# Patient Record
Sex: Female | Born: 1950
Health system: Southern US, Community
[De-identification: ages and names within clinical notes are randomized; demographics above are authoritative.]

## PROBLEM LIST (undated history)

## (undated) DIAGNOSIS — C801 Malignant (primary) neoplasm, unspecified: Secondary | ICD-10-CM

## (undated) DIAGNOSIS — IMO0002 Reserved for concepts with insufficient information to code with codable children: Secondary | ICD-10-CM

## (undated) DIAGNOSIS — G629 Polyneuropathy, unspecified: Secondary | ICD-10-CM

## (undated) DIAGNOSIS — R350 Frequency of micturition: Secondary | ICD-10-CM

## (undated) DIAGNOSIS — R51 Headache: Secondary | ICD-10-CM

## (undated) DIAGNOSIS — T4145XA Adverse effect of unspecified anesthetic, initial encounter: Secondary | ICD-10-CM

## (undated) DIAGNOSIS — R519 Headache, unspecified: Secondary | ICD-10-CM

## (undated) DIAGNOSIS — K219 Gastro-esophageal reflux disease without esophagitis: Secondary | ICD-10-CM

## (undated) DIAGNOSIS — M8580 Other specified disorders of bone density and structure, unspecified site: Secondary | ICD-10-CM

## (undated) DIAGNOSIS — E785 Hyperlipidemia, unspecified: Secondary | ICD-10-CM

## (undated) DIAGNOSIS — T8859XA Other complications of anesthesia, initial encounter: Secondary | ICD-10-CM

## (undated) DIAGNOSIS — N3941 Urge incontinence: Secondary | ICD-10-CM

## (undated) DIAGNOSIS — T7840XA Allergy, unspecified, initial encounter: Secondary | ICD-10-CM

## (undated) DIAGNOSIS — R112 Nausea with vomiting, unspecified: Secondary | ICD-10-CM

## (undated) DIAGNOSIS — M1990 Unspecified osteoarthritis, unspecified site: Secondary | ICD-10-CM

## (undated) DIAGNOSIS — IMO0001 Reserved for inherently not codable concepts without codable children: Secondary | ICD-10-CM

## (undated) DIAGNOSIS — Z9889 Other specified postprocedural states: Secondary | ICD-10-CM

## (undated) HISTORY — PX: CHOLECYSTECTOMY: SHX55

## (undated) HISTORY — DX: Unspecified osteoarthritis, unspecified site: M19.90

## (undated) HISTORY — PX: TUBAL LIGATION: SHX77

## (undated) HISTORY — DX: Allergy, unspecified, initial encounter: T78.40XA

## (undated) HISTORY — DX: Gastro-esophageal reflux disease without esophagitis: K21.9

## (undated) HISTORY — PX: COLONOSCOPY: SHX174

## (undated) HISTORY — DX: Polyneuropathy, unspecified: G62.9

---

## 1999-09-12 ENCOUNTER — Encounter: Payer: Self-pay | Admitting: Unknown Physician Specialty

## 1999-09-12 ENCOUNTER — Encounter: Admission: RE | Admit: 1999-09-12 | Discharge: 1999-09-12 | Payer: Self-pay | Admitting: Unknown Physician Specialty

## 2000-02-15 ENCOUNTER — Encounter (INDEPENDENT_AMBULATORY_CARE_PROVIDER_SITE_OTHER): Payer: Self-pay

## 2000-02-15 ENCOUNTER — Other Ambulatory Visit: Admission: RE | Admit: 2000-02-15 | Discharge: 2000-02-15 | Payer: Self-pay | Admitting: Obstetrics & Gynecology

## 2000-09-17 ENCOUNTER — Encounter: Payer: Self-pay | Admitting: Unknown Physician Specialty

## 2000-09-17 ENCOUNTER — Encounter: Admission: RE | Admit: 2000-09-17 | Discharge: 2000-09-17 | Payer: Self-pay | Admitting: Unknown Physician Specialty

## 2001-09-19 ENCOUNTER — Encounter: Admission: RE | Admit: 2001-09-19 | Discharge: 2001-09-19 | Payer: Self-pay | Admitting: Unknown Physician Specialty

## 2001-09-19 ENCOUNTER — Encounter: Payer: Self-pay | Admitting: Unknown Physician Specialty

## 2002-09-23 ENCOUNTER — Encounter: Payer: Self-pay | Admitting: Unknown Physician Specialty

## 2002-09-23 ENCOUNTER — Encounter: Admission: RE | Admit: 2002-09-23 | Discharge: 2002-09-23 | Payer: Self-pay | Admitting: Unknown Physician Specialty

## 2003-09-25 ENCOUNTER — Encounter: Admission: RE | Admit: 2003-09-25 | Discharge: 2003-09-25 | Payer: Self-pay | Admitting: Unknown Physician Specialty

## 2004-10-14 ENCOUNTER — Encounter: Admission: RE | Admit: 2004-10-14 | Discharge: 2004-10-14 | Payer: Self-pay | Admitting: Unknown Physician Specialty

## 2005-10-17 ENCOUNTER — Encounter: Admission: RE | Admit: 2005-10-17 | Discharge: 2005-10-17 | Payer: Self-pay | Admitting: Unknown Physician Specialty

## 2006-10-19 ENCOUNTER — Encounter: Admission: RE | Admit: 2006-10-19 | Discharge: 2006-10-19 | Payer: Self-pay | Admitting: Unknown Physician Specialty

## 2007-10-22 ENCOUNTER — Encounter: Admission: RE | Admit: 2007-10-22 | Discharge: 2007-10-22 | Payer: Self-pay | Admitting: Unknown Physician Specialty

## 2008-10-22 ENCOUNTER — Encounter: Admission: RE | Admit: 2008-10-22 | Discharge: 2008-10-22 | Payer: Self-pay | Admitting: Unknown Physician Specialty

## 2009-10-29 ENCOUNTER — Encounter: Admission: RE | Admit: 2009-10-29 | Discharge: 2009-10-29 | Payer: Self-pay | Admitting: Unknown Physician Specialty

## 2010-10-12 ENCOUNTER — Other Ambulatory Visit: Payer: Self-pay | Admitting: Internal Medicine

## 2010-10-12 DIAGNOSIS — Z1231 Encounter for screening mammogram for malignant neoplasm of breast: Secondary | ICD-10-CM

## 2010-11-04 ENCOUNTER — Ambulatory Visit
Admission: RE | Admit: 2010-11-04 | Discharge: 2010-11-04 | Disposition: A | Discharge status: Home / Self Care | Payer: BC Managed Care – PPO | Source: Ambulatory Visit | Attending: Internal Medicine | Admitting: Internal Medicine

## 2010-11-04 DIAGNOSIS — Z1231 Encounter for screening mammogram for malignant neoplasm of breast: Secondary | ICD-10-CM

## 2011-10-02 ENCOUNTER — Other Ambulatory Visit: Payer: Self-pay | Admitting: Family Medicine

## 2011-10-02 DIAGNOSIS — Z1231 Encounter for screening mammogram for malignant neoplasm of breast: Secondary | ICD-10-CM

## 2011-11-06 ENCOUNTER — Ambulatory Visit
Admission: RE | Admit: 2011-11-06 | Discharge: 2011-11-06 | Disposition: A | Discharge status: Home / Self Care | Payer: BC Managed Care – PPO | Source: Ambulatory Visit | Attending: Family Medicine | Admitting: Family Medicine

## 2011-11-06 DIAGNOSIS — Z1231 Encounter for screening mammogram for malignant neoplasm of breast: Secondary | ICD-10-CM

## 2012-10-02 ENCOUNTER — Other Ambulatory Visit: Payer: Self-pay

## 2012-10-02 DIAGNOSIS — Z1231 Encounter for screening mammogram for malignant neoplasm of breast: Secondary | ICD-10-CM

## 2012-11-08 ENCOUNTER — Ambulatory Visit
Admission: RE | Admit: 2012-11-08 | Discharge: 2012-11-08 | Disposition: A | Discharge status: Home / Self Care | Payer: BC Managed Care – PPO | Source: Ambulatory Visit

## 2012-11-08 DIAGNOSIS — Z1231 Encounter for screening mammogram for malignant neoplasm of breast: Secondary | ICD-10-CM

## 2013-10-08 ENCOUNTER — Other Ambulatory Visit: Payer: Self-pay

## 2013-10-08 DIAGNOSIS — Z1231 Encounter for screening mammogram for malignant neoplasm of breast: Secondary | ICD-10-CM

## 2013-11-10 ENCOUNTER — Ambulatory Visit
Admission: RE | Admit: 2013-11-10 | Discharge: 2013-11-10 | Disposition: A | Discharge status: Home / Self Care | Payer: BC Managed Care – PPO | Source: Ambulatory Visit

## 2013-11-10 DIAGNOSIS — Z1231 Encounter for screening mammogram for malignant neoplasm of breast: Secondary | ICD-10-CM

## 2013-11-12 ENCOUNTER — Other Ambulatory Visit: Payer: Self-pay | Admitting: Family Medicine

## 2013-11-12 DIAGNOSIS — R928 Other abnormal and inconclusive findings on diagnostic imaging of breast: Secondary | ICD-10-CM

## 2013-11-19 ENCOUNTER — Other Ambulatory Visit: Payer: Self-pay | Admitting: Family Medicine

## 2013-11-19 ENCOUNTER — Ambulatory Visit
Admission: RE | Admit: 2013-11-19 | Discharge: 2013-11-19 | Disposition: A | Discharge status: Home / Self Care | Payer: Medicare HMO | Source: Ambulatory Visit | Attending: Family Medicine | Admitting: Family Medicine

## 2013-11-19 ENCOUNTER — Ambulatory Visit
Admission: RE | Admit: 2013-11-19 | Discharge: 2013-11-19 | Disposition: A | Discharge status: Home / Self Care | Payer: BC Managed Care – PPO | Source: Ambulatory Visit | Attending: Family Medicine | Admitting: Family Medicine

## 2013-11-19 DIAGNOSIS — R928 Other abnormal and inconclusive findings on diagnostic imaging of breast: Secondary | ICD-10-CM

## 2014-04-10 ENCOUNTER — Other Ambulatory Visit: Payer: Self-pay | Admitting: Family Medicine

## 2014-04-10 DIAGNOSIS — R921 Mammographic calcification found on diagnostic imaging of breast: Secondary | ICD-10-CM

## 2014-05-20 ENCOUNTER — Ambulatory Visit
Admission: RE | Admit: 2014-05-20 | Discharge: 2014-05-20 | Disposition: A | Discharge status: Home / Self Care | Payer: Medicare HMO | Source: Ambulatory Visit | Attending: Family Medicine | Admitting: Family Medicine

## 2014-05-20 ENCOUNTER — Other Ambulatory Visit: Payer: Self-pay | Admitting: Family Medicine

## 2014-05-20 DIAGNOSIS — R921 Mammographic calcification found on diagnostic imaging of breast: Secondary | ICD-10-CM

## 2014-10-20 ENCOUNTER — Other Ambulatory Visit: Payer: Self-pay | Admitting: Family Medicine

## 2014-10-20 DIAGNOSIS — R921 Mammographic calcification found on diagnostic imaging of breast: Secondary | ICD-10-CM

## 2014-11-13 ENCOUNTER — Ambulatory Visit
Admission: RE | Admit: 2014-11-13 | Discharge: 2014-11-13 | Disposition: A | Discharge status: Home / Self Care | Payer: Medicare HMO | Source: Ambulatory Visit | Attending: Family Medicine | Admitting: Family Medicine

## 2014-11-13 ENCOUNTER — Other Ambulatory Visit: Payer: Self-pay | Admitting: Family Medicine

## 2014-11-13 DIAGNOSIS — R921 Mammographic calcification found on diagnostic imaging of breast: Secondary | ICD-10-CM

## 2015-02-11 ENCOUNTER — Ambulatory Visit: Payer: Medicare HMO | Attending: Gynecologic Oncology | Admitting: Gynecologic Oncology

## 2015-02-11 ENCOUNTER — Encounter: Payer: Self-pay | Admitting: Gynecologic Oncology

## 2015-02-11 VITALS — BP 150/68 | HR 82 | Temp 98.3°F | Resp 18 | Ht 67.0 in | Wt 178.1 lb

## 2015-02-11 DIAGNOSIS — C541 Malignant neoplasm of endometrium: Secondary | ICD-10-CM | POA: Diagnosis not present

## 2015-02-11 NOTE — Patient Instructions (Signed)
Preparing for your Surgery  Plan for surgery on January 12 with Dr. [name].  You will be scheduled for a robotic assisted total hysterectomy, bilateral salpingo-oophorectomy, lymph node biopsy.  Pre-operative Testing -You will receive a phone call from presurgical testing at [facility] to arrange for a pre-operative testing appointment before your surgery.  This appointment normally occurs one to two weeks before your scheduled surgery.   -Bring your insurance card, copy of an advanced directive if applicable, medication list  -At that visit, you will be asked to sign a consent for a possible blood transfusion in case a transfusion becomes necessary during surgery.  The need for a blood transfusion is rare but having consent is a necessary part of your care.     -You should not be taking blood thinners or aspirin at least ten days prior to surgery unless instructed by your surgeon.  Day Before Surgery at [place] will be asked to take in only clear liquids the day before surgery.  Examples of clear liquids include broths, jello, and clear juices.  Avoid carbonated beverages.  You will be advised to have nothing to eat or drink after midnight the evening before.    Your role in recovery Your role is to become active as soon as directed by your doctor, while still giving yourself time to heal.  Rest when you feel tired. You will be asked to do the following in order to speed your recovery:  - Cough and breathe deeply. This helps toclear and expand your lungs and can prevent pneumonia. You may be given a spirometer to practice deep breathing. A staff member will show you how to use the spirometer. - Do mild physical activity. Walking or moving your legs help your circulation and body functions return to normal. A staff member will help you when you try to walk and will provide you with simple exercises. Do not try to get up or walk alone the first time. - Actively  manage your pain. Managing your pain lets you move in comfort. We will ask you to rate your pain on a scale of zero to 10. It is your responsibility to tell your doctor or nurse where and how much you hurt so your pain can be treated.  Special Considerations -If you are diabetic, you may be placed on insulin after surgery to have closer control over your blood sugars to promote healing and recovery.  This does not mean that you will be discharged on insulin.  If applicable, your oral antidiabetics will be resumed when you are tolerating a solid diet.  -Your final pathology results from surgery should be available by the Friday after surgery and the results will be relayed to you when available.  Blood Transfusion Information WHAT IS A BLOOD TRANSFUSION? A transfusion is the replacement of blood or some of its parts. Blood is made up of multiple cells which provide different functions.  Red blood cells carry oxygen and are used for blood loss replacement.  White blood cells fight against infection.  Platelets control bleeding.  Plasma helps clot blood.  Other blood products are available for specialized needs, such as hemophilia or other clotting disorders. BEFORE THE TRANSFUSION  Who gives blood for transfusions?   You may be able to donate blood to be used at a later date on yourself (autologous donation).  Relatives can be asked to donate blood. This is generally not any safer than if you have received blood from a  stranger. The same precautions are taken to ensure safety when a relative's blood is donated.  Healthy volunteers who are fully evaluated to make sure their blood is safe. This is blood bank blood. Transfusion therapy is the safest it has ever been in the practice of medicine. Before blood is taken from a donor, a complete history is taken to make sure that person has no history of diseases nor engages in risky social behavior (examples are intravenous drug use or sexual  activity with multiple partners). The donor's travel history is screened to minimize risk of transmitting infections, such as malaria. The donated blood is tested for signs of infectious diseases, such as HIV and hepatitis. The blood is then tested to be sure it is compatible with you in order to minimize the chance of a transfusion reaction. If you or a relative donates blood, this is often done in anticipation of surgery and is not appropriate for emergency situations. It takes many days to process the donated blood. RISKS AND COMPLICATIONS Although transfusion therapy is very safe and saves many lives, the main dangers of transfusion include:   Getting an infectious disease.  Developing a transfusion reaction. This is an allergic reaction to something in the blood you were given. Every precaution is taken to prevent this. The decision to have a blood transfusion has been considered carefully by your caregiver before blood is given. Blood is not given unless the benefits outweigh the risks.

## 2015-02-11 NOTE — Progress Notes (Signed)
Consult Note: Gyn-Onc  Consult was requested by Dr. Carlena Bjornstad for the evaluation of [name] 64 y.o. female with grade 2 endometrial cancer.  CC:  Chief Complaint  Patient presents with  . endometrial cancer    New Consult    Assessment/Plan:  [name]  is a 63 y.o.  year old with grade 2 endometrial cancer.   A detailed discussion was held with the patient and her family with regard to to her endometrial cancer diagnosis. We discussed the standard management options for uterine cancer which includes surgery followed possibly by adjuvant therapy depending on the results of surgery. The options for surgical management include a hysterectomy and removal of the tubes and ovaries possibly with removal of pelvic and para-aortic lymph nodes. A minimally invasive approach including a robotic hysterectomy or laparoscopic hysterectomy have benefits including shorter hospital stay, recovery time and better wound healing. The alternative approach is an open hysterectomy. The patient has been counseled about these surgical options and the risks of surgery in general including infection, bleeding, damage to surrounding structures (including bowel, bladder, ureters, nerves or vessels), and the postoperative risks of PE/ DVT, and lymphedema. I extensively reviewed the additional risks of robotic hysterectomy including possible need for conversion to open laparotomy.  I discussed positioning during surgery of trendelenberg and risks of minor facial swelling and care we take in preoperative positioning.  After counseling and consideration of her options, she desires to proceed with robotic hysterectomy, BSO, sentinel lymph node mapping.   The patient desires that her surgery be expedited and therefore we will schedule it with my partner Dr [name]. She has a significant cystocele and apical prolapse. We discussed that this could be addressed with a combined procedure with a  urogynecologist, however, this would likely delay her surgery to 4-6 weeks in order to coordinate consultations and a coordinated surgical effort. The patient declined this. Once we have determined if she requires adjuvant therapy postoperatively we will refer her to an appropriate provider for her prolapse symptoms.  She will be seen by anesthesia for preoperative clearance and discussion of postoperative pain management.  She was given the opportunity to ask questions, which were answered to her satisfaction, and she is agreement with the above mentioned plan of care.   HPI: Erica Moyer is a 64 year old woman (G1P1) who is seen in consultation at the request of Dr Carlena Bjornstad for grade 2 endometrial cancer.  The patient reports having an episode of vaginal spotting in May 2016. She initially thought this was due to urethral irritation as she has bladder prolapse. She then continued to spot through the summer and fall of 2016 and was evaluated by Dr. Marvel Plan on 01/19/2015 at which time a transvaginal ultrasound scan was performed which revealed a normal size uterus measuring 7.4 x 8.4 x 3.7 cm with a thickened endometrial stripe 15 mm. The ovaries were grossly normal. She then underwent a endometrial biopsy on 01/28/2015 which revealed FIGO grade 2 moderately differentiated endometrioid adenocarcinoma.  The patient is otherwise very healthy. She has some hypertension. She's had one prior vaginal delivery and has significant symptomatic cystocele and vaginal prolapse. She denies urinary incontinence. She has had only a tubal ligation and a laparoscopic cholecystectomy as abdominal surgeries. She is overweight but not obese.   Current Meds:  Outpatient Encounter Prescriptions as of 02/11/2015  Medication Sig  . calcium carbonate (TUMS - DOSED IN MG ELEMENTAL CALCIUM) 500 MG chewable tablet Chew  1 tablet by mouth 3 (three) times daily as needed for indigestion or heartburn.  . Calcium  Carbonate-Vitamin D (CALTRATE 600+D PO) Take 600 mg by mouth every morning.  . Cetirizine HCl (ZYRTEC ALLERGY) 10 MG CAPS Take 10 mg by mouth every morning.  Marland Kitchen ibuprofen (ADVIL,MOTRIN) 200 MG tablet Take 200 mg by mouth every 6 (six) hours as needed.  . mometasone (NASONEX) 50 MCG/ACT nasal spray INHALE 2 SPRAYS IN EACH NOSTRIL DAILY AS NEEDED  . Multiple Vitamin (MULTIVITAMIN) tablet Take 1 tablet by mouth daily.  . rosuvastatin (CRESTOR) 10 MG tablet Take 10 mg by mouth daily.  Marland Kitchen alendronate (FOSAMAX) 35 MG tablet Take 35 mg by mouth once a week. Reported on 02/11/2015   No facility-administered encounter medications on file as of 02/11/2015.    Allergy: No Known Allergies  Social Hx:   Social History   Social History  . Marital Status: Married    Spouse Name: N/A  . Number of Children: N/A  . Years of Education: N/A   Occupational History  . Not on file.   Social History Main Topics  . Smoking status: Never Smoker   . Smokeless tobacco: Not on file  . Alcohol Use: No  . Drug Use: No  . Sexual Activity: Not on file   Other Topics Concern  . Not on file   Social History Narrative  . No narrative on file    Past Surgical Hx:  Past Surgical History  Procedure Laterality Date  . Gallbladder surgery  2007  . Tubal ligation      1986    Past Medical Hx:  Past Medical History  Diagnosis Date  . Allergy   . GERD (gastroesophageal reflux disease)     Past Gynecological History:  None. SVD x 1 No LMP recorded.  Family Hx:  Family History  Problem Relation Age of Onset  . Anesthesia problems Mother   . Hypertension Mother   . Hypertension Father   . Stroke Father   . Cancer Paternal Grandmother     Review of Systems:  Constitutional  Feels well,    ENT Normal appearing ears and nares bilaterally Skin/Breast  No rash, sores, jaundice, itching, dryness Cardiovascular  No chest pain, shortness of breath, or edema  Pulmonary  No cough or wheeze.   Gastro Intestinal  No nausea, vomitting, or diarrhoea. No bright red blood per rectum, no abdominal pain, change in bowel movement, or constipation.  Genito Urinary  No frequency, urgency, dysuria, see HPI Musculo Skeletal  No myalgia, arthralgia, joint swelling or pain  Neurologic  No weakness, numbness, change in gait,  Psychology  No depression, anxiety, insomnia.   Vitals:  Blood pressure 150/68, pulse 82, temperature 98.3 F (36.8 C), temperature source Oral, resp. rate 18, height 5\' 7"  (1.702 m), weight 178 lb 1.6 oz (80.786 kg), SpO2 100 %.  Physical Exam: WD in NAD Neck  Supple NROM, without any enlargements.  Lymph Node Survey No cervical supraclavicular or inguinal adenopathy Cardiovascular  Pulse normal rate, regularity and rhythm. S1 and S2 normal.  Lungs  Clear to auscultation bilateraly, without wheezes/crackles/rhonchi. Good air movement.  Skin  No rash/lesions/breakdown  Psychiatry  Alert and oriented to person, place, and time  Abdomen  Normoactive bowel sounds, abdomen soft, non-tender and overweight without evidence of hernia.  Back No CVA tenderness Genito Urinary  Vulva/vagina: Normal external female genitalia.  No lesions. No discharge or bleeding.  Bladder/urethra: prolapse of mid anterior vagina to introitus  Vagina: normal  Cervix: Normal appearing, no lesions.  Uterus:  Small, mobile, no parametrial involvement or nodularity.  Adnexa: no palpable masses. Rectal  Good tone, no masses no cul de sac nodularity.  Extremities  No bilateral cyanosis, clubbing or edema.   Donaciano Eva, MD  02/11/2015, 4:05 PM

## 2015-02-19 NOTE — Patient Instructions (Addendum)
YOUR PROCEDURE IS SCHEDULED ON :  02/25/15  REPORT TO [place] MAIN ENTRANCE FOLLOW SIGNS TO EAST ELEVATOR - GO TO 3rd FLOOR CHECK IN AT 3 EAST NURSES STATION (SHORT STAY) AT:  5:30 AM  CALL THIS NUMBER IF YOU HAVE PROBLEMS THE MORNING OF SURGERY [phone]  REMEMBER:ONLY 1 PER PERSON MAY GO TO SHORT STAY WITH YOU TO GET READY THE MORNING OF YOUR SURGERY  DO NOT EAT FOOD OR DRINK LIQUIDS AFTER MIDNIGHT  TAKE THESE MEDICINES THE MORNING OF SURGERY: CETRIZINE / NASONEX  [name]  CLEAR LIQUID DIET  Foods Allowed                                                                     Foods Excluded  Coffee and tea, regular and decaf                             liquids that you cannot  Plain Jell-O in any flavor                                             see through such as: Fruit ices (not with fruit pulp)                                     milk, soups, orange juice  Iced Popsicles                                                       All solid food Cranberry, grape and apple juices Sports drinks like Gatorade Lightly seasoned clear broth or consume(fat free) Sugar, honey syrup ___________________________NO CARBONATED BEVERAGES__________________________________________    YOU MAY NOT HAVE ANY METAL ON YOUR BODY INCLUDING HAIR PINS AND PIERCING'S. DO NOT WEAR JEWELRY, MAKEUP, LOTIONS, POWDERS OR PERFUMES. DO NOT WEAR NAIL POLISH. DO NOT SHAVE 48 HRS PRIOR TO SURGERY. MEN MAY SHAVE FACE AND NECK.  DO NOT [place]. [place] IS NOT RESPONSIBLE FOR VALUABLES.  CONTACTS, DENTURES OR PARTIALS MAY NOT BE WORN TO SURGERY. LEAVE SUITCASE IN CAR. CAN BE BROUGHT TO ROOM AFTER SURGERY.  PATIENTS DISCHARGED THE DAY OF SURGERY WILL NOT BE ALLOWED TO DRIVE HOME.  PLEASE READ OVER THE FOLLOWING INSTRUCTION SHEETS _________________________________________________________________________________                     [place] - PREPARING FOR SURGERY  Before surgery, you can play an important role.  Because skin is not sterile, your skin needs to be as free of germs as possible.  You can reduce the number of germs on your skin by washing with CHG (chlorahexidine gluconate) soap before surgery.  CHG is an antiseptic cleaner which kills germs and bonds with the skin to continue killing germs even after washing. Please  DO NOT use if you have an allergy to CHG or antibacterial soaps.  If your skin becomes reddened/irritated stop using the CHG and inform your nurse when you arrive at Short Stay. Do not shave (including legs and underarms) for at least 48 hours prior to the first CHG shower.  You may shave your face. Please follow these instructions carefully:   1.  Shower with CHG Soap the night before surgery and the  morning of Surgery.   2.  If you choose to wash your hair, wash your hair first as usual with your  normal  Shampoo.   3.  After you shampoo, rinse your hair and body thoroughly to remove the  shampoo.                                         4.  Use CHG as you would any other liquid soap.  You can apply chg directly  to the skin and wash . Gently wash with scrungie or clean wascloth    5.  Apply the CHG Soap to your body ONLY FROM THE NECK DOWN.   Do not use on open                           Wound or open sores. Avoid contact with eyes, ears mouth and genitals (private parts).                        Genitals (private parts) with your normal soap.              6.  Wash thoroughly, paying special attention to the area where your surgery  will be performed.   7.  Thoroughly rinse your body with warm water from the neck down.   8.  DO NOT shower/wash with your normal soap after using and rinsing off  the CHG Soap .                9.  Pat yourself dry with a clean towel.             10.  Wear clean night clothes to bed after shower             11.  Place clean sheets on your  bed the night of your first shower and do not  sleep with pets.  Day of Surgery : Do not apply any lotions/deodorants the morning of surgery.  Please wear clean clothes to the hospital/surgery center.  FAILURE TO FOLLOW THESE INSTRUCTIONS MAY RESULT IN THE CANCELLATION OF YOUR SURGERY    PATIENT SIGNATURE_________________________________  ______________________________________________________________________     Erica Moyer  An incentive spirometer is a tool that can help keep your lungs clear and active. This tool measures how well you are filling your lungs with each breath. Taking long deep breaths may help reverse or decrease the chance of developing breathing (pulmonary) problems (especially infection) following:  A long period of time when you are unable to move or be active. BEFORE THE PROCEDURE   If the spirometer includes an indicator to show your best effort, your nurse or respiratory therapist will set it to a desired goal.  If possible, sit up straight or lean slightly forward. Try not to slouch.  Hold the incentive spirometer in an upright position. INSTRUCTIONS FOR USE  Sit on the edge of your bed if possible, or sit up as far as you can in bed or on a chair.  Hold the incentive spirometer in an upright position.  Breathe out normally.  Place the mouthpiece in your mouth and seal your lips tightly around it.  Breathe in slowly and as deeply as possible, raising the piston or the ball toward the top of the column.  Hold your breath for 3-5 seconds or for as long as possible. Allow the piston or ball to fall to the bottom of the column.  Remove the mouthpiece from your mouth and breathe out normally.  Rest for a few seconds and repeat Steps 1 through 7 at least 10 times every 1-2 hours when you are awake. Take your time and take a few normal breaths between deep breaths.  The spirometer may include an indicator to show your best effort. Use the  indicator as a goal to work toward during each repetition.  After each set of 10 deep breaths, practice coughing to be sure your lungs are clear. If you have an incision (the cut made at the time of surgery), support your incision when coughing by placing a pillow or rolled up towels firmly against it. Once you are able to get out of bed, walk around indoors and cough well. You may stop using the incentive spirometer when instructed by your caregiver.  RISKS AND COMPLICATIONS  Take your time so you do not get dizzy or light-headed.  If you are in pain, you may need to take or ask for pain medication before doing incentive spirometry. It is harder to take a deep breath if you are having pain. AFTER USE  Rest and breathe slowly and easily.  It can be helpful to keep track of a log of your progress. Your caregiver can provide you with a simple table to help with this. If you are using the spirometer at home, follow these instructions: [place] IF:   You are having difficultly using the spirometer.  You have trouble using the spirometer as often as instructed.  Your pain medication is not giving enough relief while using the spirometer.  You develop fever of 100.5 F (38.1 C) or higher. SEEK IMMEDIATE MEDICAL CARE IF:   You cough up bloody sputum that had not been present before.  You develop fever of 102 F (38.9 C) or greater.  You develop worsening pain at or near the incision site. MAKE SURE YOU:   Understand these instructions.  Will watch your condition.  Will get help right away if you are not doing well or get worse. Document Released: 06/12/2006 Document Revised: 04/24/2011 Document Reviewed: 08/13/2006 Exit[employer] Patient Information 2014 [employer], [place].   _____________________________________________________________________                WHAT IS A BLOOD TRANSFUSION? Blood Transfusion Information  A transfusion is the replacement of blood or  some of its parts. Blood is made up of multiple cells which provide different functions.  Red blood cells carry oxygen and are used for blood loss replacement.  White blood cells fight against infection.  Platelets control bleeding.  Plasma helps clot blood.  Other blood products are available for specialized needs, such as hemophilia or other clotting disorders. BEFORE THE TRANSFUSION  Who gives blood for transfusions?   Healthy volunteers who are fully evaluated to make sure their blood is safe. This is blood bank blood. Transfusion therapy is the safest it has  ever been in the practice of medicine. Before blood is taken from a donor, a complete history is taken to make sure that person has no history of diseases nor engages in risky social behavior (examples are intravenous drug use or sexual activity with multiple partners). The donor's travel history is screened to minimize risk of transmitting infections, such as malaria. The donated blood is tested for signs of infectious diseases, such as HIV and hepatitis. The blood is then tested to be sure it is compatible with you in order to minimize the chance of a transfusion reaction. If you or a relative donates blood, this is often done in anticipation of surgery and is not appropriate for emergency situations. It takes many days to process the donated blood. RISKS AND COMPLICATIONS Although transfusion therapy is very safe and saves many lives, the main dangers of transfusion include:   Getting an infectious disease.  Developing a transfusion reaction. This is an allergic reaction to something in the blood you were given. Every precaution is taken to prevent this. The decision to have a blood transfusion has been considered carefully by your caregiver before blood is given. Blood is not given unless the benefits outweigh the risks. AFTER THE TRANSFUSION  Right after receiving a blood transfusion, you will usually feel much better and more  energetic. This is especially true if your red blood cells have gotten low (anemic). The transfusion raises the level of the red blood cells which carry oxygen, and this usually causes an energy increase.  The nurse administering the transfusion will monitor you carefully for complications. HOME CARE INSTRUCTIONS  No special instructions are needed after a transfusion. You may find your energy is better. Speak with your caregiver about any limitations on activity for underlying diseases you may have. SEEK MEDICAL CARE IF:   Your condition is not improving after your transfusion.  You develop redness or irritation at the intravenous (IV) site. SEEK IMMEDIATE MEDICAL CARE IF:  Any of the following symptoms occur over the next 12 hours:  Shaking chills.  You have a temperature by mouth above 102 F (38.9 C), not controlled by medicine.  Chest, back, or muscle pain.  People around you feel you are not acting correctly or are confused.  Shortness of breath or difficulty breathing.  Dizziness and fainting.  You get a rash or develop hives.  You have a decrease in urine output.  Your urine turns a dark color or changes to pink, red, or brown. Any of the following symptoms occur over the next 10 days:  You have a temperature by mouth above 102 F (38.9 C), not controlled by medicine.  Shortness of breath.  Weakness after normal activity.  The white part of the eye turns yellow (jaundice).  You have a decrease in the amount of urine or are urinating less often.  Your urine turns a dark color or changes to pink, red, or brown. Document Released: 01/28/2000 Document Revised: 04/24/2011 Document Reviewed: 09/16/2007 [facility] Patient Information 2014 [place], [place].  _______________________________________________________________________

## 2015-02-23 ENCOUNTER — Ambulatory Visit (HOSPITAL_COMMUNITY)
Admission: RE | Admit: 2015-02-23 | Discharge: 2015-02-23 | Disposition: A | Discharge status: Home / Self Care | Payer: PPO | Source: Ambulatory Visit | Attending: Gynecologic Oncology | Admitting: Gynecologic Oncology

## 2015-02-23 ENCOUNTER — Encounter (HOSPITAL_COMMUNITY): Payer: Self-pay

## 2015-02-23 ENCOUNTER — Encounter (HOSPITAL_COMMUNITY)
Admission: RE | Admit: 2015-02-23 | Discharge: 2015-02-23 | Disposition: A | Discharge status: Home / Self Care | Payer: PPO | Source: Ambulatory Visit | Attending: Gynecologic Oncology | Admitting: Gynecologic Oncology

## 2015-02-23 DIAGNOSIS — Z01812 Encounter for preprocedural laboratory examination: Secondary | ICD-10-CM | POA: Insufficient documentation

## 2015-02-23 DIAGNOSIS — Z01818 Encounter for other preprocedural examination: Secondary | ICD-10-CM | POA: Insufficient documentation

## 2015-02-23 DIAGNOSIS — Z0183 Encounter for blood typing: Secondary | ICD-10-CM | POA: Insufficient documentation

## 2015-02-23 DIAGNOSIS — C541 Malignant neoplasm of endometrium: Secondary | ICD-10-CM | POA: Insufficient documentation

## 2015-02-23 HISTORY — DX: Other specified postprocedural states: Z98.890

## 2015-02-23 HISTORY — DX: Headache: R51

## 2015-02-23 HISTORY — DX: Other specified disorders of bone density and structure, unspecified site: M85.80

## 2015-02-23 HISTORY — DX: Malignant (primary) neoplasm, unspecified: C80.1

## 2015-02-23 HISTORY — DX: Headache, unspecified: R51.9

## 2015-02-23 HISTORY — DX: Nausea with vomiting, unspecified: R11.2

## 2015-02-23 HISTORY — DX: Adverse effect of unspecified anesthetic, initial encounter: T41.45XA

## 2015-02-23 HISTORY — DX: Other complications of anesthesia, initial encounter: T88.59XA

## 2015-02-23 HISTORY — DX: Frequency of micturition: R35.0

## 2015-02-23 HISTORY — DX: Urge incontinence: N39.41

## 2015-02-23 HISTORY — DX: Hyperlipidemia, unspecified: E78.5

## 2015-02-23 LAB — CBC WITH DIFFERENTIAL/PLATELET
BASOS ABS: 0 10*3/uL (ref 0.0–0.1)
Basophils Relative: 0 %
Eosinophils Absolute: 0.2 10*3/uL (ref 0.0–0.7)
Eosinophils Relative: 4 %
HEMATOCRIT: 42 % (ref 36.0–46.0)
HEMOGLOBIN: 13.7 g/dL (ref 12.0–15.0)
LYMPHS ABS: 1.8 10*3/uL (ref 0.7–4.0)
LYMPHS PCT: 33 %
MCH: 28.7 pg (ref 26.0–34.0)
MCHC: 32.6 g/dL (ref 30.0–36.0)
MCV: 87.9 fL (ref 78.0–100.0)
Monocytes Absolute: 0.5 10*3/uL (ref 0.1–1.0)
Monocytes Relative: 10 %
NEUTROS ABS: 2.9 10*3/uL (ref 1.7–7.7)
Neutrophils Relative %: 53 %
PLATELETS: 194 10*3/uL (ref 150–400)
RBC: 4.78 MIL/uL (ref 3.87–5.11)
RDW: 13 % (ref 11.5–15.5)
WBC: 5.4 10*3/uL (ref 4.0–10.5)

## 2015-02-23 LAB — COMPREHENSIVE METABOLIC PANEL
ALK PHOS: 54 U/L (ref 38–126)
ALT: 23 U/L (ref 14–54)
AST: 22 U/L (ref 15–41)
Albumin: 4.4 g/dL (ref 3.5–5.0)
Anion gap: 7 (ref 5–15)
BILIRUBIN TOTAL: 0.9 mg/dL (ref 0.3–1.2)
BUN: 13 mg/dL (ref 6–20)
CALCIUM: 9.8 mg/dL (ref 8.9–10.3)
CHLORIDE: 110 mmol/L (ref 101–111)
CO2: 27 mmol/L (ref 22–32)
CREATININE: 0.69 mg/dL (ref 0.44–1.00)
Glucose, Bld: 103 mg/dL — ABNORMAL HIGH (ref 65–99)
Potassium: 4.6 mmol/L (ref 3.5–5.1)
Sodium: 144 mmol/L (ref 135–145)
Total Protein: 7.6 g/dL (ref 6.5–8.1)

## 2015-02-23 LAB — URINALYSIS, ROUTINE W REFLEX MICROSCOPIC
Bilirubin Urine: NEGATIVE
Glucose, UA: NEGATIVE mg/dL
HGB URINE DIPSTICK: NEGATIVE
Ketones, ur: NEGATIVE mg/dL
Nitrite: NEGATIVE
PH: 7.5 (ref 5.0–8.0)
Protein, ur: NEGATIVE mg/dL
SPECIFIC GRAVITY, URINE: 1.022 (ref 1.005–1.030)

## 2015-02-23 LAB — URINE MICROSCOPIC-ADD ON

## 2015-02-23 LAB — ABO/RH: ABO/RH(D): AB NEG

## 2015-02-25 ENCOUNTER — Ambulatory Visit (HOSPITAL_COMMUNITY): Payer: PPO | Admitting: Certified Registered Nurse Anesthetist

## 2015-02-25 ENCOUNTER — Encounter (HOSPITAL_COMMUNITY): Payer: Self-pay | Admitting: *Deleted

## 2015-02-25 ENCOUNTER — Encounter (HOSPITAL_COMMUNITY)
Admission: RE | Disposition: A | Discharge status: Home / Self Care | Payer: Self-pay | Source: Ambulatory Visit | Attending: Obstetrics & Gynecology

## 2015-02-25 ENCOUNTER — Ambulatory Visit (HOSPITAL_COMMUNITY)
Admission: RE | Admit: 2015-02-25 | Discharge: 2015-02-26 | Disposition: A | Discharge status: Home / Self Care | Payer: PPO | Source: Ambulatory Visit | Attending: Obstetrics & Gynecology | Admitting: Obstetrics & Gynecology

## 2015-02-25 DIAGNOSIS — N814 Uterovaginal prolapse, unspecified: Secondary | ICD-10-CM | POA: Insufficient documentation

## 2015-02-25 DIAGNOSIS — E663 Overweight: Secondary | ICD-10-CM | POA: Insufficient documentation

## 2015-02-25 DIAGNOSIS — C541 Malignant neoplasm of endometrium: Secondary | ICD-10-CM | POA: Diagnosis present

## 2015-02-25 DIAGNOSIS — Z79899 Other long term (current) drug therapy: Secondary | ICD-10-CM | POA: Insufficient documentation

## 2015-02-25 DIAGNOSIS — K219 Gastro-esophageal reflux disease without esophagitis: Secondary | ICD-10-CM | POA: Diagnosis not present

## 2015-02-25 DIAGNOSIS — Z6828 Body mass index (BMI) 28.0-28.9, adult: Secondary | ICD-10-CM | POA: Insufficient documentation

## 2015-02-25 DIAGNOSIS — I10 Essential (primary) hypertension: Secondary | ICD-10-CM | POA: Insufficient documentation

## 2015-02-25 HISTORY — PX: ROBOTIC ASSISTED TOTAL HYSTERECTOMY WITH BILATERAL SALPINGO OOPHERECTOMY: SHX6086

## 2015-02-25 LAB — TYPE AND SCREEN
ABO/RH(D): AB NEG
Antibody Screen: NEGATIVE

## 2015-02-25 SURGERY — HYSTERECTOMY, TOTAL, ROBOT-ASSISTED, LAPAROSCOPIC, WITH BILATERAL SALPINGO-OOPHORECTOMY
Anesthesia: General | Laterality: Bilateral

## 2015-02-25 MED ORDER — ROCURONIUM BROMIDE 100 MG/10ML IV SOLN
INTRAVENOUS | Status: DC | PRN
  Administered 2015-02-25: 50 mg via INTRAVENOUS
  Administered 2015-02-25: 20 mg via INTRAVENOUS
  Administered 2015-02-25: 10 mg via INTRAVENOUS

## 2015-02-25 MED ORDER — ONDANSETRON HCL 4 MG/2ML IJ SOLN: 4.0000 mg | Freq: Four times a day (QID) | INTRAMUSCULAR | Status: DC | PRN

## 2015-02-25 MED ORDER — LIDOCAINE HCL (CARDIAC) 20 MG/ML IV SOLN
INTRAVENOUS | Status: DC | PRN
  Administered 2015-02-25: 50 mg via INTRAVENOUS

## 2015-02-25 MED ORDER — KCL IN DEXTROSE-NACL 20-5-0.45 MEQ/L-%-% IV SOLN
INTRAVENOUS | Status: DC
  Administered 2015-02-25 – 2015-02-26 (×2): via INTRAVENOUS
  Filled 2015-02-25 (×3): qty 1000

## 2015-02-25 MED ORDER — HEPARIN SODIUM (PORCINE) 5000 UNIT/ML IJ SOLN
5000.0000 [IU] | INTRAMUSCULAR | Status: AC
  Administered 2015-02-25: 5000 [IU] via SUBCUTANEOUS
  Filled 2015-02-25: qty 1

## 2015-02-25 MED ORDER — SUGAMMADEX SODIUM 500 MG/5ML IV SOLN
INTRAVENOUS | Status: AC
  Filled 2015-02-25: qty 5

## 2015-02-25 MED ORDER — ONDANSETRON HCL 4 MG/2ML IJ SOLN
INTRAMUSCULAR | Status: AC
  Filled 2015-02-25: qty 2

## 2015-02-25 MED ORDER — ENOXAPARIN SODIUM 40 MG/0.4ML ~~LOC~~ SOLN
40.0000 mg | SUBCUTANEOUS | Status: DC
  Administered 2015-02-26: 40 mg via SUBCUTANEOUS
  Filled 2015-02-25 (×2): qty 0.4

## 2015-02-25 MED ORDER — EPHEDRINE SULFATE 50 MG/ML IJ SOLN
INTRAMUSCULAR | Status: DC | PRN
  Administered 2015-02-25: 15 mg via INTRAVENOUS
  Administered 2015-02-25 (×2): 10 mg via INTRAVENOUS

## 2015-02-25 MED ORDER — CEFAZOLIN SODIUM-DEXTROSE 2-3 GM-% IV SOLR
INTRAVENOUS | Status: AC
  Filled 2015-02-25: qty 50

## 2015-02-25 MED ORDER — PROPOFOL 10 MG/ML IV BOLUS
INTRAVENOUS | Status: AC
  Filled 2015-02-25: qty 40

## 2015-02-25 MED ORDER — CEFAZOLIN SODIUM-DEXTROSE 2-3 GM-% IV SOLR
2.0000 g | INTRAVENOUS | Status: AC
  Administered 2015-02-25: 2 g via INTRAVENOUS

## 2015-02-25 MED ORDER — SUGAMMADEX SODIUM 500 MG/5ML IV SOLN
INTRAVENOUS | Status: DC | PRN
  Administered 2015-02-25: 350 mg via INTRAVENOUS

## 2015-02-25 MED ORDER — FENTANYL CITRATE (PF) 250 MCG/5ML IJ SOLN
INTRAMUSCULAR | Status: AC
  Filled 2015-02-25: qty 5

## 2015-02-25 MED ORDER — OXYCODONE-ACETAMINOPHEN 5-325 MG PO TABS: 1.0000 | ORAL_TABLET | ORAL | Status: DC | PRN

## 2015-02-25 MED ORDER — LIDOCAINE HCL (CARDIAC) 20 MG/ML IV SOLN
INTRAVENOUS | Status: AC
  Filled 2015-02-25: qty 5

## 2015-02-25 MED ORDER — ONDANSETRON HCL 4 MG/2ML IJ SOLN
4.0000 mg | Freq: Four times a day (QID) | INTRAMUSCULAR | Status: DC | PRN
  Administered 2015-02-25: 4 mg via INTRAVENOUS
  Filled 2015-02-25: qty 2

## 2015-02-25 MED ORDER — ONDANSETRON HCL 4 MG PO TABS: 4.0000 mg | ORAL_TABLET | Freq: Four times a day (QID) | ORAL | Status: DC | PRN

## 2015-02-25 MED ORDER — RINGERS IRRIGATION IR SOLN
Status: DC | PRN
  Administered 2015-02-25: 1

## 2015-02-25 MED ORDER — ROSUVASTATIN CALCIUM 10 MG PO TABS
10.0000 mg | ORAL_TABLET | Freq: Every day | ORAL | Status: DC
  Administered 2015-02-25: 10 mg via ORAL
  Filled 2015-02-25 (×2): qty 1

## 2015-02-25 MED ORDER — OXYCODONE HCL 5 MG/5ML PO SOLN
5.0000 mg | Freq: Once | ORAL | Status: DC | PRN
  Filled 2015-02-25: qty 5

## 2015-02-25 MED ORDER — HYDROMORPHONE HCL 1 MG/ML IJ SOLN
0.2500 mg | INTRAMUSCULAR | Status: DC | PRN
  Administered 2015-02-25 (×2): 0.5 mg via INTRAVENOUS

## 2015-02-25 MED ORDER — ONDANSETRON HCL 4 MG/2ML IJ SOLN
INTRAMUSCULAR | Status: DC | PRN
  Administered 2015-02-25: 4 mg via INTRAVENOUS

## 2015-02-25 MED ORDER — HYDROMORPHONE HCL 1 MG/ML IJ SOLN
INTRAMUSCULAR | Status: AC
  Filled 2015-02-25: qty 1

## 2015-02-25 MED ORDER — SCOPOLAMINE 1 MG/3DAYS TD PT72
MEDICATED_PATCH | TRANSDERMAL | Status: DC | PRN
  Administered 2015-02-25: 1 via TRANSDERMAL

## 2015-02-25 MED ORDER — GABAPENTIN 400 MG PO CAPS
400.0000 mg | ORAL_CAPSULE | Freq: Every day | ORAL | Status: DC
  Filled 2015-02-25: qty 1

## 2015-02-25 MED ORDER — OXYCODONE HCL 5 MG PO TABS: 5.0000 mg | ORAL_TABLET | Freq: Once | ORAL | Status: DC | PRN

## 2015-02-25 MED ORDER — PHENYLEPHRINE HCL 10 MG/ML IJ SOLN
INTRAMUSCULAR | Status: DC | PRN
  Administered 2015-02-25 (×2): 120 ug via INTRAVENOUS

## 2015-02-25 MED ORDER — LACTATED RINGERS IV SOLN
INTRAVENOUS | Status: DC | PRN
  Administered 2015-02-25 (×2): via INTRAVENOUS

## 2015-02-25 MED ORDER — PROPOFOL 10 MG/ML IV BOLUS
INTRAVENOUS | Status: DC | PRN
  Administered 2015-02-25: 60 mg via INTRAVENOUS
  Administered 2015-02-25: 140 mg via INTRAVENOUS

## 2015-02-25 MED ORDER — FENTANYL CITRATE (PF) 100 MCG/2ML IJ SOLN
INTRAMUSCULAR | Status: DC | PRN
  Administered 2015-02-25: 100 ug via INTRAVENOUS
  Administered 2015-02-25: 50 ug via INTRAVENOUS
  Administered 2015-02-25 (×2): 100 ug via INTRAVENOUS

## 2015-02-25 MED ORDER — HYDROMORPHONE HCL 1 MG/ML IJ SOLN: 0.2000 mg | INTRAMUSCULAR | Status: DC | PRN

## 2015-02-25 MED ORDER — SCOPOLAMINE 1 MG/3DAYS TD PT72
MEDICATED_PATCH | TRANSDERMAL | Status: AC
  Filled 2015-02-25: qty 1

## 2015-02-25 MED ORDER — IBUPROFEN 200 MG PO TABS
200.0000 mg | ORAL_TABLET | Freq: Four times a day (QID) | ORAL | Status: DC | PRN
  Administered 2015-02-25 – 2015-02-26 (×2): 200 mg via ORAL
  Filled 2015-02-25 (×2): qty 1

## 2015-02-25 MED ORDER — MIDAZOLAM HCL 2 MG/2ML IJ SOLN
INTRAMUSCULAR | Status: AC
  Filled 2015-02-25: qty 2

## 2015-02-25 MED ORDER — MIDAZOLAM HCL 5 MG/5ML IJ SOLN
INTRAMUSCULAR | Status: DC | PRN
  Administered 2015-02-25: 2 mg via INTRAVENOUS

## 2015-02-25 SURGICAL SUPPLY — 57 items
BENZOIN TINCTURE PRP APPL 2/3 (GAUZE/BANDAGES/DRESSINGS) IMPLANT
CABLE HIGH FREQUENCY MONO STRZ (ELECTRODE) ×3 IMPLANT
CHLORAPREP W/TINT 26ML (MISCELLANEOUS) ×3 IMPLANT
CLOSURE WOUND 1/2 X4 (GAUZE/BANDAGES/DRESSINGS)
CORDS BIPOLAR (ELECTRODE) ×3 IMPLANT
COVER SURGICAL LIGHT HANDLE (MISCELLANEOUS) ×3 IMPLANT
COVER TIP SHEARS 8 DVNC (MISCELLANEOUS) ×1 IMPLANT
COVER TIP SHEARS 8MM DA VINCI (MISCELLANEOUS) ×2
DRAPE COLUMN DVNC XI (DISPOSABLE) IMPLANT
DRAPE DA VINCI XI COLUMN (DISPOSABLE)
DRAPE SHEET LG 3/4 BI-LAMINATE (DRAPES) ×6 IMPLANT
DRAPE SURG IRRIG POUCH 19X23 (DRAPES) ×3 IMPLANT
DRAPE TABLE BACK 44X90 PK DISP (DRAPES) ×6 IMPLANT
DRAPE WARM FLUID 44X44 (DRAPE) ×3 IMPLANT
DRSG TEGADERM 2-3/8X2-3/4 SM (GAUZE/BANDAGES/DRESSINGS) IMPLANT
DRSG TEGADERM 4X4.75 (GAUZE/BANDAGES/DRESSINGS) IMPLANT
DRSG TEGADERM 6X8 (GAUZE/BANDAGES/DRESSINGS) ×6 IMPLANT
ELECT REM PT RETURN 9FT ADLT (ELECTROSURGICAL) ×3
ELECTRODE REM PT RTRN 9FT ADLT (ELECTROSURGICAL) ×1 IMPLANT
GAUZE SPONGE 2X2 8PLY STRL LF (GAUZE/BANDAGES/DRESSINGS) ×2 IMPLANT
GLOVE BIO SURGEON STRL SZ 6.5 (GLOVE) ×4 IMPLANT
GLOVE BIO SURGEON STRL SZ7.5 (GLOVE) ×9 IMPLANT
GLOVE BIO SURGEONS STRL SZ 6.5 (GLOVE) ×2
GLOVE INDICATOR 8.0 STRL GRN (GLOVE) ×6 IMPLANT
GOWN STRL NON-REIN LRG LVL3 (GOWN DISPOSABLE) ×3 IMPLANT
GOWN STRL REUS W/TWL XL LVL3 (GOWN DISPOSABLE) ×6 IMPLANT
HOLDER FOLEY CATH W/STRAP (MISCELLANEOUS) ×3 IMPLANT
KIT BASIN OR (CUSTOM PROCEDURE TRAY) ×3 IMPLANT
MANIPULATOR UTERINE 4.5 ZUMI (MISCELLANEOUS) ×3 IMPLANT
MARKER SKIN DUAL TIP RULER LAB (MISCELLANEOUS) ×3 IMPLANT
OCCLUDER COLPOPNEUMO (BALLOONS) ×3 IMPLANT
POUCH SPECIMEN RETRIEVAL 10MM (ENDOMECHANICALS) IMPLANT
SET BI-LUMEN FLTR TB AIRSEAL (TUBING) ×3 IMPLANT
SET TUBE IRRIG SUCTION NO TIP (IRRIGATION / IRRIGATOR) ×3 IMPLANT
SHEET LAVH (DRAPES) ×3 IMPLANT
SOLUTION ELECTROLUBE (MISCELLANEOUS) ×3 IMPLANT
SPONGE GAUZE 2X2 STER 10/PKG (GAUZE/BANDAGES/DRESSINGS) ×4
SPONGE LAP 18X18 X RAY DECT (DISPOSABLE) IMPLANT
STRIP CLOSURE SKIN 1/2X4 (GAUZE/BANDAGES/DRESSINGS) IMPLANT
SUT VIC AB 0 CT1 27 (SUTURE) ×2
SUT VIC AB 0 CT1 27XBRD ANTBC (SUTURE) ×1 IMPLANT
SUT VIC AB 4-0 PS2 27 (SUTURE) ×6 IMPLANT
SUT VICRYL 0 UR6 27IN ABS (SUTURE) ×3 IMPLANT
SUT VLOC 180 0 9IN  GS21 (SUTURE)
SUT VLOC 180 0 9IN GS21 (SUTURE) IMPLANT
SYR 50ML LL SCALE MARK (SYRINGE) ×3 IMPLANT
SYR BULB IRRIGATION 50ML (SYRINGE) IMPLANT
TOWEL OR 17X26 10 PK STRL BLUE (TOWEL DISPOSABLE) ×6 IMPLANT
TOWEL OR NON WOVEN STRL DISP B (DISPOSABLE) ×3 IMPLANT
TRAP SPECIMEN MUCOUS 40CC (MISCELLANEOUS) IMPLANT
TRAY FOLEY W/METER SILVER 14FR (SET/KITS/TRAYS/PACK) ×3 IMPLANT
TRAY FOLEY W/METER SILVER 16FR (SET/KITS/TRAYS/PACK) IMPLANT
TRAY LAPAROSCOPIC (CUSTOM PROCEDURE TRAY) ×3 IMPLANT
TROCAR 12M 150ML BLUNT (TROCAR) ×3 IMPLANT
TROCAR BLADELESS OPT 5 75 (ENDOMECHANICALS) ×3 IMPLANT
TROCAR XCEL 12X100 BLDLESS (ENDOMECHANICALS) ×3 IMPLANT
WATER STERILE IRR 1500ML POUR (IV SOLUTION) ×6 IMPLANT

## 2015-02-25 NOTE — Transfer of Care (Signed)
Immediate Anesthesia Transfer of Care Note  Patient: Erica Moyer  Procedure(s) Performed: Procedure(s): XI ROBOTIC ASSISTED TOTAL HYSTERECTOMY WITH BILATERAL SALPINGO OOPHORECTOMY WITH SENTINAL LYMPH NODE MAPPING (Bilateral)  Patient Location: PACU  Anesthesia Type:General  Level of Consciousness: sedated, patient cooperative and responds to stimulation  Airway & Oxygen Therapy: Patient Spontanous Breathing and Patient connected to face mask oxygen  Post-op Assessment: Report given to RN and Post -op Vital signs reviewed and stable  Post vital signs: Reviewed and stable  Last Vitals:  Filed Vitals:   02/25/15 0519  BP: 128/84  Pulse: 87  Temp: 36.5 C  Resp: 18    Complications: No apparent anesthesia complications

## 2015-02-25 NOTE — Anesthesia Postprocedure Evaluation (Signed)
Anesthesia Post Note  Patient: Erica Moyer  Procedure(s) Performed: Procedure(s) (LRB): XI ROBOTIC ASSISTED TOTAL HYSTERECTOMY WITH BILATERAL SALPINGO OOPHORECTOMY WITH SENTINAL LYMPH NODE MAPPING (Bilateral)  Patient location during evaluation: PACU Anesthesia Type: General Level of consciousness: awake and alert and patient cooperative Pain management: pain level controlled Vital Signs Assessment: post-procedure vital signs reviewed and stable Respiratory status: spontaneous breathing and respiratory function stable Cardiovascular status: stable Anesthetic complications: no    Last Vitals:  Filed Vitals:   02/25/15 1115 02/25/15 1130  BP: 128/81 131/81  Pulse: 83 83  Temp: 36.3 C 36.3 C  Resp: 9 11    Last Pain:  Filed Vitals:   02/25/15 1136  PainSc: [place]

## 2015-02-25 NOTE — Anesthesia Procedure Notes (Signed)
Procedure Name: Intubation Performed by: Jessie Cowher J Pre-anesthesia Checklist: Patient identified, Emergency Drugs available, Suction available, Patient being monitored and Timeout performed Patient Re-evaluated:Patient Re-evaluated prior to inductionOxygen Delivery Method: Circle system utilized Preoxygenation: Pre-oxygenation with 100% oxygen Intubation Type: IV induction Ventilation: Mask ventilation without difficulty Laryngoscope Size: Mac and 3 Grade View: Grade II Tube type: Oral Number of attempts: 1 Airway Equipment and Method: Stylet Placement Confirmation: ETT inserted through vocal cords under direct vision,  positive ETCO2,  CO2 detector and breath sounds checked- equal and bilateral Secured at: 21 cm Tube secured with: Tape Dental Injury: Teeth and Oropharynx as per pre-operative assessment      

## 2015-02-25 NOTE — Interval H&P Note (Signed)
History and Physical Interval Note:  02/25/2015 7:21 AM  [place]  has presented today for surgery, with the diagnosis of endometrial cancer  The various methods of treatment have been discussed with the patient and family. After consideration of risks, benefits and other options for treatment, the patient has consented to  Procedure(s): XI ROBOTIC ASSISTED TOTAL HYSTERECTOMY WITH BILATERAL SALPINGO OOPHORECTOMY WITH SENTINAL LYMPH NODE MAPPING (Bilateral) as a surgical intervention .  The patient's history has been reviewed, patient examined, no change in status, stable for surgery.  I have reviewed the patient's chart and labs.  Questions were answered to the patient's satisfaction.     [place], [facility]

## 2015-02-25 NOTE — H&P (View-Only) (Signed)
Consult Note: Gyn-Onc  Consult was requested by Dr. Carlena Bjornstad for the evaluation of Erica Moyer 65 y.o. female with grade 2 endometrial cancer.  CC:  Chief Complaint  Patient presents with  . endometrial cancer    New Consult    Assessment/Plan:  [name]  is a 65 y.o.  year old with grade 2 endometrial cancer.   A detailed discussion was held with the patient and her family with regard to to her endometrial cancer diagnosis. We discussed the standard management options for uterine cancer which includes surgery followed possibly by adjuvant therapy depending on the results of surgery. The options for surgical management include a hysterectomy and removal of the tubes and ovaries possibly with removal of pelvic and para-aortic lymph nodes. A minimally invasive approach including a robotic hysterectomy or laparoscopic hysterectomy have benefits including shorter hospital stay, recovery time and better wound healing. The alternative approach is an open hysterectomy. The patient has been counseled about these surgical options and the risks of surgery in general including infection, bleeding, damage to surrounding structures (including bowel, bladder, ureters, nerves or vessels), and the postoperative risks of PE/ DVT, and lymphedema. I extensively reviewed the additional risks of robotic hysterectomy including possible need for conversion to open laparotomy.  I discussed positioning during surgery of trendelenberg and risks of minor facial swelling and care we take in preoperative positioning.  After counseling and consideration of her options, she desires to proceed with robotic hysterectomy, BSO, sentinel lymph node mapping.   The patient desires that her surgery be expedited and therefore we will schedule it with my partner Dr [name]. She has a significant cystocele and apical prolapse. We discussed that this could be addressed with a combined procedure with a  urogynecologist, however, this would likely delay her surgery to 4-6 weeks in order to coordinate consultations and a coordinated surgical effort. The patient declined this. Once we have determined if she requires adjuvant therapy postoperatively we will refer her to an appropriate provider for her prolapse symptoms.  She will be seen by anesthesia for preoperative clearance and discussion of postoperative pain management.  She was given the opportunity to ask questions, which were answered to her satisfaction, and she is agreement with the above mentioned plan of care.   HPI: Erica Moyer is a 65 year old woman (G1P1) who is seen in consultation at the request of Dr Carlena Bjornstad for grade 2 endometrial cancer.  The patient reports having an episode of vaginal spotting in May 2016. She initially thought this was due to urethral irritation as she has bladder prolapse. She then continued to spot through the summer and fall of 2016 and was evaluated by Dr. Marvel Plan on 01/19/2015 at which time a transvaginal ultrasound scan was performed which revealed a normal size uterus measuring 7.4 x 8.4 x 3.7 cm with a thickened endometrial stripe 15 mm. The ovaries were grossly normal. She then underwent a endometrial biopsy on 01/28/2015 which revealed FIGO grade 2 moderately differentiated endometrioid adenocarcinoma.  The patient is otherwise very healthy. She has some hypertension. She's had one prior vaginal delivery and has significant symptomatic cystocele and vaginal prolapse. She denies urinary incontinence. She has had only a tubal ligation and a laparoscopic cholecystectomy as abdominal surgeries. She is overweight but not obese.   Current Meds:  Outpatient Encounter Prescriptions as of 02/11/2015  Medication Sig  . calcium carbonate (TUMS - DOSED IN MG ELEMENTAL CALCIUM) 500 MG chewable tablet Chew  1 tablet by mouth 3 (three) times daily as needed for indigestion or heartburn.  . Calcium  Carbonate-Vitamin D (CALTRATE 600+D PO) Take 600 mg by mouth every morning.  . Cetirizine HCl (ZYRTEC ALLERGY) 10 MG CAPS Take 10 mg by mouth every morning.  Marland Kitchen ibuprofen (ADVIL,MOTRIN) 200 MG tablet Take 200 mg by mouth every 6 (six) hours as needed.  . mometasone (NASONEX) 50 MCG/ACT nasal spray INHALE 2 SPRAYS IN EACH NOSTRIL DAILY AS NEEDED  . Multiple Vitamin (MULTIVITAMIN) tablet Take 1 tablet by mouth daily.  . rosuvastatin (CRESTOR) 10 MG tablet Take 10 mg by mouth daily.  Marland Kitchen alendronate (FOSAMAX) 35 MG tablet Take 35 mg by mouth once a week. Reported on 02/11/2015   No facility-administered encounter medications on file as of 02/11/2015.    Allergy: No Known Allergies  Social Hx:   Social History   Social History  . Marital Status: Married    Spouse Name: N/A  . Number of Children: N/A  . Years of Education: N/A   Occupational History  . Not on file.   Social History Main Topics  . Smoking status: Never Smoker   . Smokeless tobacco: Not on file  . Alcohol Use: No  . Drug Use: No  . Sexual Activity: Not on file   Other Topics Concern  . Not on file   Social History Narrative  . No narrative on file    Past Surgical Hx:  Past Surgical History  Procedure Laterality Date  . Gallbladder surgery  2007  . Tubal ligation      1986    Past Medical Hx:  Past Medical History  Diagnosis Date  . Allergy   . GERD (gastroesophageal reflux disease)     Past Gynecological History:  None. SVD x 1 No LMP recorded.  Family Hx:  Family History  Problem Relation Age of Onset  . Anesthesia problems Mother   . Hypertension Mother   . Hypertension Father   . Stroke Father   . Cancer Paternal Grandmother     Review of Systems:  Constitutional  Feels well,    ENT Normal appearing ears and nares bilaterally Skin/Breast  No rash, sores, jaundice, itching, dryness Cardiovascular  No chest pain, shortness of breath, or edema  Pulmonary  No cough or wheeze.   Gastro Intestinal  No nausea, vomitting, or diarrhoea. No bright red blood per rectum, no abdominal pain, change in bowel movement, or constipation.  Genito Urinary  No frequency, urgency, dysuria, see HPI Musculo Skeletal  No myalgia, arthralgia, joint swelling or pain  Neurologic  No weakness, numbness, change in gait,  Psychology  No depression, anxiety, insomnia.   Vitals:  Blood pressure 150/68, pulse 82, temperature 98.3 F (36.8 C), temperature source Oral, resp. rate 18, height 5\' 7"  (1.702 m), weight 178 lb 1.6 oz (80.786 kg), SpO2 100 %.  Physical Exam: WD in NAD Neck  Supple NROM, without any enlargements.  Lymph Node Survey No cervical supraclavicular or inguinal adenopathy Cardiovascular  Pulse normal rate, regularity and rhythm. S1 and S2 normal.  Lungs  Clear to auscultation bilateraly, without wheezes/crackles/rhonchi. Good air movement.  Skin  No rash/lesions/breakdown  Psychiatry  Alert and oriented to person, place, and time  Abdomen  Normoactive bowel sounds, abdomen soft, non-tender and overweight without evidence of hernia.  Back No CVA tenderness Genito Urinary  Vulva/vagina: Normal external female genitalia.  No lesions. No discharge or bleeding.  Bladder/urethra: prolapse of mid anterior vagina to introitus  Vagina: normal  Cervix: Normal appearing, no lesions.  Uterus:  Small, mobile, no parametrial involvement or nodularity.  Adnexa: no palpable masses. Rectal  Good tone, no masses no cul de sac nodularity.  Extremities  No bilateral cyanosis, clubbing or edema.   Donaciano Eva, MD  02/11/2015, 4:05 PM

## 2015-02-25 NOTE — Op Note (Signed)
Preoperative Diagnosis: Grade 2 endometrial cancer  Postoperative Diagnosis: Grade 2 endometrial cancer  Procedure(s) Performed: Robotic total laparoscopic hysterectomy, Bilateral salpingo oophorectomy,  Left pelvic lymph node dissection, Right sentinel lymph node dissection  Anesthesia: GET  Surgeon: Francetta Found.  Skeet Latch, M.D. PhD  Assistant Surgeon:LisaJackson-Moore MD.   Specimens: Uterus cervix,ovaries, tubes, right obturator sentinel lymph, left pelvic lymph node dissection  Estimated Blood Loss: <162mL.   Complications: None  Indication for Procedure: This is a 65 y.o.  who underwent prior endometrial assessment demonstrating  grade 2 endometrial cancer.  Operative Findings:  8 cm uterus. normal adnexa. No masses.   Procedure: Patient was taken to the operating room and placed under general endotracheal anesthesia without any difficulty. She is placed in the dorsal lithotomy position the upper extremities padded and tucked.  She was secured to the operative table over the chest with tape.   The patient was prepped and draped and the uterine manipulator placed within the endometrial cavity. Isocyanine green dye was injected into the cervix and an appropriately sized Koh ring was circumferentially around the cervix. The balloon was placed within the vagina. An OG tube was present and functional. At an area on the left in line with the nipple approximately 2 cm below the ribs the area was infiltrated with 1% lidocaine and a 5 mm Optiview inserted under direct visualization. The abdomen was insufflated to 15 mm of mercury and the pressure never deviated above that throughout the remainder of the procedure. Maximum Trendelenburg positioning was obtained. At approximately 22 cm proximal to the symphysis pubis an incision was made just superior to the umbilicus. This area was infiltrated with lidocaine as well as the location 10 cm lateral to this incision and 2 cm superior to the left anterior  superior iliac spine. Incisions were made. 10 mm trocar was inserted in the superior umbilicus incision. Xi robotic ports were placed in the other 3 incisions. The left upper quadrant port site was replaced with a 12 mm port. This was all completed under direct visualization. The small and large bowel were reflected as much as possible into the upper abdomen. The robot was docked and instruments placed.  The right round ligament was transected and the ureter was identified. The right infundibulopelvic ligament was cauterized and transected The retroperitoneal space was entered on the right and the peritoneum incised to the level of the vesicouterine ligament anteriorly. A right sentinel obturator LN was identified as the  Sentinel LN  And was removed with cautery.  The bladder flap was created using Bovie cautery. The peritoneal dissection was continued inferiorly and across the inferior most aspect of the cervix. In this manner the urethra was deflected inferiorly. The bladder flap was further developed. The uterine vessels on the right were skeletonized ligated and transected.  The left retroperitoneal space was identified and the ureter was identified. The left gonadal vessels were cauterized and transected. The retroperitoneal space was evaluated and no LN was noted to map. Left pelvic lymph node dissection was then initiated. The superior vesicle artery was identified and the vesicouterine space developed. The obturator nerve was identified. Nodal tissue was removed within the boundaries of the left genitofemoral nerve the left circumflex vein, the ureter and the superior vesicle artery.  The nodal tissue was placed in an Endo Catch bag.The specimen was placed in an endo catch bag.  The broad ligament was skeletonized posteriorly to the level of the cervix and the peritoneum dissected free from the cervix and  in this fashion the ureter was deflected inferiorly. The anterior peritoneum was further dissected  and the bladder flap appropriately developed. The uterine vessels were skeletonized cauterized and transected.  A colpotomy incision was made circumferentially and the uterus cervix ovaries and tubes and left pelvic LN were delivered from the vagina. The Koh ring was removed and the balloon was replaced.  The pelvis was copiously irrigated and drained and hemostasis was assured. The vaginal cuff was closed with a running 0 Vicryl suture ligature. The needle was removed under direct visualization. The operative site is once again visualized and the intra-abdominal pressure reduced. Hemostasis was assured.The instruments were removed from the abdomen and pelvis under direct visualization and the port sites irrigated. The LUQ fascia was closed with an interrupted 0 Vicryl  suture. Skin incisions were closed with a subcuticular suture and dermabond was placed over the incisions.  The vaginal vault was cleared with a moist sponge stick.  Sponge, lap and needle counts were correct x 3.    The patient had sequential compression devices and preoperative Lovenox for VTE prophylaxis and will receive Lovenox postoperatively.          Disposition: PACU - hemodynamically stable.         Condition:stable Foley draining clear urine.

## 2015-02-25 NOTE — Anesthesia Preprocedure Evaluation (Signed)
Anesthesia Evaluation  Patient identified by MRN, date of birth, ID band Patient awake    Reviewed: Allergy & Precautions, NPO status , Patient's Chart, lab work & pertinent test results  History of Anesthesia Complications (+) PONV  Airway Mallampati: II   Neck ROM: full    Dental   Pulmonary    breath sounds clear to auscultation       Cardiovascular negative cardio ROS   Rhythm:regular Rate:Normal     Neuro/Psych  Headaches,    GI/Hepatic GERD  ,  Endo/Other    Renal/GU      Musculoskeletal   Abdominal   Peds  Hematology   Anesthesia Other Findings   Reproductive/Obstetrics                             Anesthesia Physical Anesthesia Plan  ASA: II  Anesthesia Plan: General   Post-op Pain Management:    Induction: Intravenous  Airway Management Planned: Oral ETT  Additional Equipment:   Intra-op Plan:   Post-operative Plan: Extubation in OR  Informed Consent: I have reviewed the patients History and Physical, chart, labs and discussed the procedure including the risks, benefits and alternatives for the proposed anesthesia with the patient or authorized representative who has indicated his/her understanding and acceptance.     Plan Discussed with: CRNA, Anesthesiologist and Surgeon  Anesthesia Plan Comments:         Anesthesia Quick Evaluation

## 2015-02-26 DIAGNOSIS — C541 Malignant neoplasm of endometrium: Secondary | ICD-10-CM | POA: Diagnosis not present

## 2015-02-26 LAB — BASIC METABOLIC PANEL
Anion gap: 10 (ref 5–15)
BUN: 10 mg/dL (ref 6–20)
CHLORIDE: 110 mmol/L (ref 101–111)
CO2: 24 mmol/L (ref 22–32)
Calcium: 9.1 mg/dL (ref 8.9–10.3)
Creatinine, Ser: 0.78 mg/dL (ref 0.44–1.00)
GFR calc Af Amer: >60 mL/min (ref >60)
GLUCOSE: 103 mg/dL — AB (ref 65–99)
POTASSIUM: 4.2 mmol/L (ref 3.5–5.1)
Sodium: 144 mmol/L (ref 135–145)

## 2015-02-26 LAB — CBC
HCT: 39.3 % (ref 36.0–46.0)
Hemoglobin: 12.5 g/dL (ref 12.0–15.0)
MCH: 28 pg (ref 26.0–34.0)
MCHC: 31.8 g/dL (ref 30.0–36.0)
MCV: 87.9 fL (ref 78.0–100.0)
PLATELETS: 187 10*3/uL (ref 150–400)
RBC: 4.47 MIL/uL (ref 3.87–5.11)
RDW: 13.4 % (ref 11.5–15.5)
WBC: 8.9 10*3/uL (ref 4.0–10.5)

## 2015-02-26 MED ORDER — TRAMADOL HCL 50 MG PO TABS: 50.0000 mg | ORAL_TABLET | Freq: Four times a day (QID) | ORAL | Status: DC | PRN

## 2015-02-26 NOTE — Discharge Summary (Signed)
Physician Discharge Summary  Patient ID: Erica Moyer MRN: [id number] DOB/AGE: 67-Jun-1952 67 y.o.  Admit date: 02/25/2015 Discharge date: 02/26/2015  Admission Diagnoses: Endometrial cancer [facility])  Discharge Diagnoses:  Principal Problem:   Endometrial cancer [facility])   Discharged Condition:  The patient is in good condition and stable for discharge.   Hospital Course: On 02/25/2015, the patient underwent the following: Procedure(s): XI ROBOTIC ASSISTED TOTAL HYSTERECTOMY WITH BILATERAL SALPINGO OOPHORECTOMY WITH SENTINAL LYMPH NODE MAPPING.  The postoperative course was uneventful.  She was discharged to home on postoperative day 1 tolerating a regular diet, passing flatus, voiding.  Consults: None  Significant Diagnostic Studies: None  Treatments: surgery: see above  Discharge Exam: Blood pressure 97/52, pulse 82, temperature 97.4 F (36.3 C), temperature source Oral, resp. rate 18, height 5\' 7"  (1.702 m), weight 180 lb (81.647 kg), SpO2 99 %. General appearance: alert, cooperative and no distress Resp: clear to auscultation bilaterally Cardio: regular rate and rhythm, S1, S2 normal, no murmur, click, rub or gallop GI: soft, non-tender; bowel sounds normal; no masses,  no organomegaly Extremities: extremities normal, atraumatic, no cyanosis or edema Incision/Wound: Lap sites with dermabond without erythema or drainage  Disposition: Home      Discharge Instructions    Call MD for:  difficulty breathing, headache or visual disturbances    Complete by:  As directed      Call MD for:  extreme fatigue    Complete by:  As directed      Call MD for:  hives    Complete by:  As directed      Call MD for:  persistant dizziness or light-headedness    Complete by:  As directed      Call MD for:  persistant nausea and vomiting    Complete by:  As directed      Call MD for:  redness, tenderness, or signs of infection (pain, swelling, redness, odor or green/yellow discharge  around incision site)    Complete by:  As directed      Call MD for:  severe uncontrolled pain    Complete by:  As directed      Call MD for:  temperature >100.4    Complete by:  As directed      Diet - low sodium heart healthy    Complete by:  As directed      Driving Restrictions    Complete by:  As directed   No driving for 1 week.  Do not take narcotics and drive.     Increase activity slowly    Complete by:  As directed      Lifting restrictions    Complete by:  As directed   No lifting greater than 10 lbs.     Sexual Activity Restrictions    Complete by:  As directed   No sexual activity, nothing in the vagina, for 6-8 weeks.            Medication List    TAKE these medications        alendronate 35 MG tablet  Commonly known as:  FOSAMAX  Take 35 mg by mouth once a week. Reported on 02/11/2015     calcium carbonate 500 MG chewable tablet  Commonly known as:  TUMS - dosed in mg elemental calcium  Chew 1 tablet by mouth 3 (three) times daily as needed for indigestion or heartburn.     CALTRATE 600+D PO  Take 600 mg by mouth every morning.  ibuprofen 200 MG tablet  Commonly known as:  ADVIL,MOTRIN  Take 200 mg by mouth every 6 (six) hours as needed (pain).     mometasone 50 MCG/ACT nasal spray  Commonly known as:  NASONEX  INHALE 1 SPRAYS IN EACH NOSTRIL DAILY AS NEEDED for congestion     multivitamin tablet  Take 1 tablet by mouth daily.     rosuvastatin 10 MG tablet  Commonly known as:  CRESTOR  Take 10 mg by mouth at bedtime.     traMADol 50 MG tablet  Commonly known as:  ULTRAM  Take 1 tablet (50 mg total) by mouth every 6 (six) hours as needed.     ZYRTEC ALLERGY 10 MG Caps  Generic drug:  Cetirizine HCl  Take 10 mg by mouth every morning.       Follow-up Information    Follow up with [name], MD On 03/18/2015.   Specialty:  Obstetrics and Gynecology   Why:  at the [place] at 12:30pm.   Contact information:   [address] [address] [phone]       Greater than thirty minutes were spend for face to face discharge instructions and discharge orders/summary in EPIC.   Signed: CROSS, MELISSA DEAL 02/26/2015, 9:14 AM

## 2015-02-26 NOTE — Discharge Instructions (Signed)
02/26/2015  Return to work: 4-6 weeks if applicable  Activity: 1. Be up and out of the bed during the day.  Take a nap if needed.  You may walk up steps but be careful and use the hand rail.  Stair climbing will tire you more than you think, you may need to stop part way and rest.   2. No lifting or straining for 6 weeks.  3. No driving for 1 week(s).  Do not drive if you are taking narcotic pain medicine.  4. Shower daily.  Use soap and water on your incision and pat dry; don't rub.  No tub baths until cleared by your surgeon.   5. No sexual activity and nothing in the vagina for 8 weeks.  6. You may experience a small amount of clear drainage from your incisions, which is normal.  If the drainage persists or increases, please call the office.   Diet: 1. Low sodium Heart Healthy Diet is recommended.  2. It is safe to use a laxative, such as Miralax or Colace, if you have difficulty moving your bowels.   Wound Care: 1. Keep clean and dry.  Shower daily.  Reasons to call the Doctor:  Fever - Oral temperature greater than 100.4 degrees Fahrenheit  Foul-smelling vaginal discharge  Difficulty urinating  Nausea and vomiting  Increased pain at the site of the incision that is unrelieved with pain medicine.  Difficulty breathing with or without chest pain  New calf pain especially if only on one side  Sudden, continuing increased vaginal bleeding with or without clots.   Contacts: For questions or concerns you should contact:  Dr. Everitt Amber or Dr. [name] at [phone]  Joylene John, NP at [phone]  After Hours: call [phone] and have the GYN Oncologist paged/contacted  Abdominal Hysterectomy, Care After These instructions give you information on caring for yourself after your procedure. Your doctor may also give you more specific instructions. Call your doctor if you have any problems or questions after your procedure.  HOME CARE It takes 4-6 weeks  to recover from this surgery. Follow all of your doctor's instructions.   Only take medicines as told by your doctor.  Change your bandage as told by your doctor.  Return to your doctor to have your stitches taken out.  Take showers for 2-3 weeks. Ask your doctor when it is okay to shower.  Do not douche, use tampons, or have sex (intercourse) for at least 6 weeks or as told.  Follow your doctor's advice about exercise, lifting objects, driving, and general activities.  Get plenty of rest and sleep.  Do not lift anything heavier than a gallon of milk (about 10 pounds [4.5 kilograms]) for the first month after surgery.  Get back to your normal diet as told by your doctor.  Do not drink alcohol until your doctor says it is okay.  Take a medicine to help you poop (laxative) as told by your doctor.  Eating foods high in fiber may help you poop. Eat a lot of raw fruits and vegetables, whole grains, and beans.  Drink enough fluids to keep your pee (urine) clear or pale yellow.  Have someone help you at home for 1-2 weeks after your surgery.  Keep follow-up doctor visits as told. GET HELP IF:  You have chills or fever.  You have puffiness, redness, or pain in area of the cut (incision).  You have yellowish-white fluid (pus) coming from the cut.  You have a  bad smell coming from the cut or bandage.  Your cut pulls apart.  You feel dizzy or light-headed.  You have pain or bleeding when you pee.  You keep having watery poop (diarrhea).  You keep feeling sick to your stomach (nauseous) or keep throwing up (vomiting).  You have fluid (discharge) coming from your vagina.  You have a rash.  You have a reaction to your medicine.  You need stronger pain medicine. GET HELP RIGHT AWAY IF:   You have a fever and your symptoms suddenly get worse.  You have bad belly (abdominal) pain.  You have chest pain.  You are short of breath.  You pass out (faint).  You have  pain, puffiness, or redness of your leg.  You bleed a lot from your vagina and notice clumps of tissue (clots). MAKE SURE YOU:   Understand these instructions.  Will watch your condition.  Will get help right away if you are not doing well or get worse.   This information is not intended to replace advice given to you by your health care provider. Make sure you discuss any questions you have with your health care provider.   Document Released: 11/09/2007 Document Revised: 02/04/2013 Document Reviewed: 11/22/2012 Elsevier Interactive Patient Education 2016 Elsevier Inc.  Tramadol tablets What is this medicine? TRAMADOL (TRA ma dole) is a pain reliever. It is used to treat moderate to severe pain in adults. This medicine may be used for other purposes; ask your health care provider or pharmacist if you have questions. What should I tell my health care provider before I take this medicine? They need to know if you have any of these conditions: -brain tumor -depression -drug abuse or addiction -head injury -if you frequently drink alcohol containing drinks -kidney disease or trouble passing urine -liver disease -lung disease, asthma, or breathing problems -seizures or epilepsy -suicidal thoughts, plans, or attempt; a previous suicide attempt by you or a family member -an unusual or allergic reaction to tramadol, codeine, other medicines, foods, dyes, or preservatives -pregnant or trying to get pregnant -breast-feeding How should I use this medicine? Take this medicine by mouth with a full glass of water. Follow the directions on the prescription label. If the medicine upsets your stomach, take it with food or milk. Do not take more medicine than you are told to take. Talk to your pediatrician regarding the use of this medicine in children. Special care may be needed. Overdosage: If you think you have taken too much of this medicine contact a poison control center or emergency room  at once. NOTE: This medicine is only for you. Do not share this medicine with others. What if I miss a dose? If you miss a dose, take it as soon as you can. If it is almost time for your next dose, take only that dose. Do not take double or extra doses. What may interact with this medicine? Do not take this medicine with any of the following medications: -MAOIs like Carbex, Eldepryl, Marplan, Nardil, and Parnate This medicine may also interact with the following medications: -alcohol or medicines that contain alcohol -antihistamines -benzodiazepines -bupropion -carbamazepine or oxcarbazepine -clozapine -cyclobenzaprine -digoxin -furazolidone -linezolid -medicines for depression, anxiety, or psychotic disturbances -medicines for migraine headache like almotriptan, eletriptan, frovatriptan, naratriptan, rizatriptan, sumatriptan, zolmitriptan -medicines for pain like pentazocine, buprenorphine, butorphanol, meperidine, nalbuphine, and propoxyphene -medicines for sleep -muscle relaxants -naltrexone -phenobarbital -phenothiazines like perphenazine, thioridazine, chlorpromazine, mesoridazine, fluphenazine, prochlorperazine, promazine, and trifluoperazine -procarbazine -warfarin This list may  not describe all possible interactions. Give your health care provider a list of all the medicines, herbs, non-prescription drugs, or dietary supplements you use. Also tell them if you smoke, drink alcohol, or use illegal drugs. Some items may interact with your medicine. What should I watch for while using this medicine? Tell your doctor or health care professional if your pain does not go away, if it gets worse, or if you have new or a different type of pain. You may develop tolerance to the medicine. Tolerance means that you will need a higher dose of the medicine for pain relief. Tolerance is normal and is expected if you take this medicine for a long time. Do not suddenly stop taking your medicine  because you may develop a severe reaction. Your body becomes used to the medicine. This does NOT mean you are addicted. Addiction is a behavior related to getting and using a drug for a non-medical reason. If you have pain, you have a medical reason to take pain medicine. Your doctor will tell you how much medicine to take. If your doctor wants you to stop the medicine, the dose will be slowly lowered over time to avoid any side effects. You may get drowsy or dizzy. Do not drive, use machinery, or do anything that needs mental alertness until you know how this medicine affects you. Do not stand or sit up quickly, especially if you are an older patient. This reduces the risk of dizzy or fainting spells. Alcohol can increase or decrease the effects of this medicine. Avoid alcoholic drinks. You may have constipation. Try to have a bowel movement at least every 2 to 3 days. If you do not have a bowel movement for 3 days, call your doctor or health care professional. Your mouth may get dry. Chewing sugarless gum or sucking hard candy, and drinking plenty of water may help. Contact your doctor if the problem does not go away or is severe. What side effects may I notice from receiving this medicine? Side effects that you should report to your doctor or health care professional as soon as possible: -allergic reactions like skin rash, itching or hives, swelling of the face, lips, or tongue -breathing difficulties, wheezing -confusion -itching -light headedness or fainting spells -redness, blistering, peeling or loosening of the skin, including inside the mouth -seizures Side effects that usually do not require medical attention (report to your doctor or health care professional if they continue or are bothersome): -constipation -dizziness -drowsiness -headache -nausea, vomiting This list may not describe all possible side effects. Call your doctor for medical advice about side effects. You may report side  effects to FDA at 1-800-FDA-1088. Where should I keep my medicine? Keep out of the reach of children. This medicine may cause accidental overdose and death if it taken by other adults, children, or pets. Mix any unused medicine with a substance like cat litter or coffee grounds. Then throw the medicine away in a sealed container like a sealed bag or a coffee can with a lid. Do not use the medicine after the expiration date. Store at room temperature between 15 and 30 degrees C (59 and 86 degrees F). NOTE: This sheet is a summary. It may not cover all possible information. If you have questions about this medicine, talk to your doctor, pharmacist, or health care provider.    2016, Elsevier/Gold Standard. (2013-03-28 15:42:09)

## 2015-03-02 ENCOUNTER — Telehealth: Payer: Self-pay

## 2015-03-02 NOTE — Telephone Encounter (Signed)
Patient's call returned , re: rash noted on Sunday 02/28/2015 , patient states she contributes the rash to the elastic on her pajama bottoms irritating her a few inches above her pubic bone . Patient states she now has 2 "oval" shaped non- raised rash area's that were "itching" Sunday , but the itching has "subsided" . Patient denies any redness, warmth or drainage from the indicated site. Patient states she thought it may have the "soap" used prior to the surgery . Patient instructed to monitor and call with any additional changes , questions or concerns , patient agreed with paln , denied further questions and or concerns at this time.

## 2015-03-04 ENCOUNTER — Telehealth: Payer: Self-pay | Admitting: Gynecologic Oncology

## 2015-03-04 NOTE — Telephone Encounter (Signed)
Post op telephone call to check patient status.  Patient describes expected post operative status.  Adequate PO intake reported.  Bowels and bladder functioning without difficulty.  Pain minimal.  Reportable signs and symptoms reviewed.  Follow up appt arranged.  Final pathology discussed along with Dr. Leone Brand recommendations for vaginal brachytherapy.  No concerns voiced.  Patient advised more information would be given at her follow up appt.   Advised to call for any questions or concerns.  Incisions healing well without erythema or drainage.

## 2015-03-16 ENCOUNTER — Telehealth: Payer: Self-pay | Admitting: Gynecologic Oncology

## 2015-03-16 NOTE — Telephone Encounter (Signed)
Consult Note: Gyn-Onc  GYN Physician  Dr. Carlena Moyer   Chief Complaint:   Stage IA endometrial cancer.  Post op check, treatment planning.  Assessment/Moyer:  [name]  is a 65 y.o.  year old with grade 2 endometrial cancer. S/P endometrial cancer staging 02/2015.  10% myoinvasion, grade 2 with LVSI Recommend vaginal brachytherapy Follow-up in 3 months    HPI: Erica Moyer is a 65 y.o. initially seen  at the request of Dr Erica Moyer for grade 2 endometrial cancer.  Erica Moyer reports having an episode of vaginal spotting in May 2016. She initially thought this was due to urethral irritation as she has bladder prolapse. She then continued to spot through the summer and fall of 2016 and was evaluated by Dr. Marvel Moyer on 01/19/2015 at which time a transvaginal ultrasound scan was performed which revealed a normal size uterus measuring 7.4 x 8.4 x 3.7 cm with a thickened endometrial stripe 15 mm. The ovaries were grossly normal. She then underwent a endometrial biopsy on 01/28/2015 which revealed FIGO grade 2 moderately differentiated endometrioid adenocarcinoma.  On 02/25/2015 she underwent [place] Right SNL and left PLND  1. Lymph node, sentinel, biopsy, right obturator sentinel - ONE BENIGN LYMPH NODE (0/1). 2. Lymph nodes, regional resection, left pelvic - SIX BENIGN LYMPH NODES (0/6). 3. Uterus +/- tubes/ovaries, neoplastic - ENDOMETRIAL ADENOCARCINOMA, 2.4 CM WITH SUPERFICIAL MYOMETRIAL INVASION. - FOCAL LYMPHATIC VASCULAR INVOLVEMENT BY TUMOR. - CERVIX, BILATERAL FALLOPIAN TUBES AND OVARIES FREE OF TUMOR. Microscopic Comment 3. ONCOLOGY TABLE-UTERUS, CARCINOMA OR CARCINOSARCOMA Specimen: Uterus with bilateral fallopian tubes and ovaries, right obturator sentinel lymph node and left pelvic lymph node. Procedure: Hysterectomy with bilateral salpingo-oophorectomy, right obturator sentinel lymph node and left pelvic regional resection. Lymph  node sampling performed: Yes Specimen integrity: Intact. Maximum tumor size: 2.4 cm Histologic type: Endometrioid with squamous differentiation. Grade: 3 Myometrial invasion: 0.2 cm where myometrium is 2 cm in thickness Cervical stromal involvement: No Extent of involvement of other organs: None detected. Lymph - vascular invasion: Present. Lymph nodes: # examined 7; # positive 0 TNM code: pT1a, pN0 FIGO Stage (based on pathologic findings, needs clinical correlation): IA  Review of Systems:  Constitutional  Feels well,    ENT Normal appearing ears and nares bilaterally Skin/Breast  No rash, sores, jaundice, itching, dryness Cardiovascular  No chest pain, shortness of breath, or edema  Pulmonary  No cough or wheeze.  Gastro Intestinal  No nausea, vomitting, or diarrhoea. No bright red blood per rectum, no abdominal pain, change in bowel movement, or constipation.  Genito Urinary  No frequency, urgency, dysuria, see HPI Musculo Skeletal  No myalgia, arthralgia, joint swelling or pain  Neurologic  No weakness, numbness, change in gait,  Psychology  No depression, anxiety, insomnia.     Social Hx:   Social History   Social History  . Marital Status: Married    Spouse Name: N/A  . Number of Children: N/A  . Years of Education: N/A   Occupational History  . Not on file.   Social History Main Topics  . Smoking status: Never Smoker   . Smokeless tobacco: Not on file  . Alcohol Use: No  . Drug Use: No  . Sexual Activity: Not on file   Other Topics Concern  . Not on file   Social History Narrative    Past Surgical Hx:  Past Surgical History  Procedure Laterality Date  . Tubal ligation      1986  .  Cholecystectomy    . Robotic assisted total hysterectomy with bilateral salpingo oopherectomy Bilateral 02/25/2015    Procedure: XI ROBOTIC ASSISTED TOTAL HYSTERECTOMY WITH BILATERAL SALPINGO OOPHORECTOMY WITH SENTINAL LYMPH NODE MAPPING;  Surgeon: [name],  MD;  Location: [facility];  Service: Gynecology;  Laterality: Bilateral;    Past Medical Hx:  Past Medical History  Diagnosis Date  . GERD (gastroesophageal reflux disease)   . Complication of anesthesia     slow to wake up  . PONV (postoperative nausea and vomiting)   . Hyperlipidemia   . Headache     hx migraines  . Osteopenia   . Frequency of urination   . Urgency incontinence   . Cancer [facility])     endometrial cancer    Past Gynecological History:  None. SVD x 1 No LMP recorded. Patient is postmenopausal.  Family Hx:  Family History  Problem Relation Age of Onset  . Anesthesia problems Mother   . Hypertension Mother   . Hypertension Father   . Stroke Father   . Cancer Paternal Grandmother     Vitals:  Blood pressure 150/68, pulse 82, temperature 98.3 F (36.8 C), temperature source Oral, resp. rate 18, height 5\' 7"  (1.702 m), weight 178 lb 1.6 oz (80.786 kg), SpO2 100 %.  Physical Exam: WD in NAD Neck  Supple NROM, without any enlargements.  Lymph Node Survey No cervical supraclavicular or inguinal adenopathy Cardiovascular  Pulse normal rate, regularity and rhythm. S1 and S2 normal.  Lungs  Clear to auscultation bilateraly, without wheezes/crackles/rhonchi. Good air movement.  Skin  No rash/lesions/breakdown  Psychiatry  Alert and oriented to person, place, and time  Abdomen  Normoactive bowel sounds, abdomen soft, non-tender and overweight without evidence of hernia.  Back No CVA tenderness Genito Urinary  Vulva/vagina: Normal external female genitalia.  No lesions. No discharge or bleeding.  Bladder/urethra: prolapse of mid anterior vagina to introitus  Vagina: normal  Rectal  Good tone, no masses no cul de sac nodularity.  Extremities  No bilateral cyanosis, clubbing or edema.   [name], MD  03/16/2015, 11:06 AM

## 2015-03-18 ENCOUNTER — Ambulatory Visit: Payer: PPO | Attending: Gynecologic Oncology | Admitting: Gynecologic Oncology

## 2015-03-18 ENCOUNTER — Encounter: Payer: Self-pay | Admitting: Gynecologic Oncology

## 2015-03-18 VITALS — BP 136/60 | HR 70 | Temp 98.3°F | Resp 18 | Ht 67.0 in | Wt 180.1 lb

## 2015-03-18 DIAGNOSIS — C541 Malignant neoplasm of endometrium: Secondary | ICD-10-CM | POA: Insufficient documentation

## 2015-03-18 NOTE — Progress Notes (Signed)
Consult Note: Gyn-Onc  GYN Physician  Dr. Carlena Bjornstad   Chief Complaint:   Stage IA endometrial cancer.  Post op check, treatment planning.  Assessment/Plan:  [name]  is a 65 y.o.  year old with grade 2 endometrial cancer. S/P endometrial cancer staging 02/2015.  10% myoinvasion, grade 2 with LVSI Recommend vaginal brachytherapy Schedule appointment with Radiation Oncology Follow-up in 3 months     HPI: Erica Moyer is a 65 y.o. initially seen  at the request of Dr Carlena Bjornstad for grade 2 endometrial cancer.  Ms. Sade Wischmeyer Schwanke reports having an episode of vaginal spotting in May 2016. She initially thought this was due to urethral irritation as she has bladder prolapse. She then continued to spot through the summer and fall of 2016 and was evaluated by Dr. Marvel Plan on 01/19/2015 at which time a transvaginal ultrasound scan was performed which revealed a normal size uterus measuring 7.4 x 8.4 x 3.7 cm with a thickened endometrial stripe 15 mm. The ovaries were grossly normal. She then underwent a endometrial biopsy on 01/28/2015 which revealed FIGO grade 2 moderately differentiated endometrioid adenocarcinoma.  On 02/25/2015 she underwent [place] Right SNL and left PLND  1. Lymph node, sentinel, biopsy, right obturator sentinel - ONE BENIGN LYMPH NODE (0/1). 2. Lymph nodes, regional resection, left pelvic - SIX BENIGN LYMPH NODES (0/6). 3. Uterus +/- tubes/ovaries, neoplastic - ENDOMETRIAL ADENOCARCINOMA, 2.4 CM WITH SUPERFICIAL MYOMETRIAL INVASION. - FOCAL LYMPHATIC VASCULAR INVOLVEMENT BY TUMOR. - CERVIX, BILATERAL FALLOPIAN TUBES AND OVARIES FREE OF TUMOR. Microscopic Comment 3. ONCOLOGY TABLE-UTERUS, CARCINOMA OR CARCINOSARCOMA Specimen: Uterus with bilateral fallopian tubes and ovaries, right obturator sentinel lymph node and left pelvic lymph node. Procedure: Hysterectomy with bilateral salpingo-oophorectomy, right obturator sentinel lymph  node and left pelvic regional resection. Lymph node sampling performed: Yes Specimen integrity: Intact. Maximum tumor size: 2.4 cm Histologic type: Endometrioid with squamous differentiation. Grade: 3 Myometrial invasion: 0.2 cm where myometrium is 2 cm in thickness Cervical stromal involvement: No Extent of involvement of other organs: None detected. Lymph - vascular invasion: Present. Lymph nodes: # examined 7; # positive 0 TNM code: pT1a, pN0 FIGO Stage (based on pathologic findings, needs clinical correlation): IA  Review of Systems:  Constitutional  Feels well,    Cardiovascular  No chest pain, shortness of breath, or edema  Pulmonary  No cough or wheeze.  Gastro Intestinal  No nausea, vomitting, or diarrhoea. No bright red blood per rectum, no abdominal pain, no change in bowel movement, or constipation.  Genito Urinary  No frequency, urgency, dysuria, no vaginal bleeding. Musculo Skeletal  No myalgia, arthralgia, joint swelling or pain no changes in sensation or motor function Neurologic  No weakness, numbness, change in gait,  Psychology  No depression, anxiety, insomnia.     Social Hx:   Social History   Social History  . Marital Status: Married    Spouse Name: N/A  . Number of Children: N/A  . Years of Education: N/A   Occupational History  . Not on file.   Social History Main Topics  . Smoking status: Never Smoker   . Smokeless tobacco: Not on file  . Alcohol Use: No  . Drug Use: No  . Sexual Activity: Not on file   Other Topics Concern  . Not on file   Social History Narrative    Past Surgical Hx:  Past Surgical History  Procedure Laterality Date  . Tubal ligation      1986  .  Cholecystectomy    . Robotic assisted total hysterectomy with bilateral salpingo oopherectomy Bilateral 02/25/2015    Procedure: XI ROBOTIC ASSISTED TOTAL HYSTERECTOMY WITH BILATERAL SALPINGO OOPHORECTOMY WITH SENTINAL LYMPH NODE MAPPING;  Surgeon: [name],  MD;  Location: [facility];  Service: Gynecology;  Laterality: Bilateral;    Past Medical Hx:  Past Medical History  Diagnosis Date  . GERD (gastroesophageal reflux disease)   . Complication of anesthesia     slow to wake up  . PONV (postoperative nausea and vomiting)   . Hyperlipidemia   . Headache     hx migraines  . Osteopenia   . Frequency of urination   . Urgency incontinence   . Cancer [facility])     endometrial cancer    Past Gynecological History:  None. SVD x 1 No LMP recorded. Patient is postmenopausal.  Family Hx:  Family History  Problem Relation Age of Onset  . Anesthesia problems Mother   . Hypertension Mother   . Hypertension Father   . Stroke Father   . Cancer Paternal Grandmother     Vitals:  Blood pressure 136/60, pulse 70, temperature 98.3 F (36.8 C), temperature source Oral, resp. rate 18, height 5\' 7"  (1.702 m), weight 180 lb 1.6 oz (81.693 kg), SpO2 100 %.  Physical Exam: WD in NAD Lungs  Clear to auscultation bilateraly,  Good air movement.  Skin  No rash/lesions/breakdown  Psychiatry  Alert and oriented to person, place, and time  Abdomen  Normoactive bowel sounds, abdomen soft, non-tender and overweight without evidence of hernia.  Back No CVA tenderness Genito Urinary  Vulva/vagina: Normal external female genitalia.  No lesions. No discharge or bleeding. Cuff intact, no cul de sac masses or tenderness  Bladder/urethra: prolapse of mid anterior vagina to introitus  Vagina: normal Extremities  No bilateral cyanosis, clubbing or edema.   [name], MD  03/18/2015, 1:04 PM

## 2015-03-18 NOTE — Patient Instructions (Signed)
Stage !A grade 2 endometrial cancer Recommend vaginal brachytherapy Referral to Dr. Sondra Come.  Follow-up in 3 months  Thank you very much [name] for allowing me to provide care for you today.  I appreciate your confidence in choosing our Gynecologic Oncology team.  If you have any questions about your visit today please call our office and we will get back to you as soon as possible.  Please consider using the website Medlineplus.gov as an Geneticist, molecular.   Francetta Found. Addalynne Golding MD., PhD Gynecologic Oncology

## 2015-03-19 NOTE — Progress Notes (Signed)
GYN Location of Tumor / Histology:Stage IA endometrial cancer   [place] presented with symptoms of: having an episode of vaginal spotting in May 2016  Biopsies revealed:   02/25/15 Diagnosis 1. Lymph node, sentinel, biopsy, right obturator sentinel - ONE BENIGN LYMPH NODE (0/1). 2. Lymph nodes, regional resection, left pelvic - SIX BENIGN LYMPH NODES (0/6). 3. Uterus +/- tubes/ovaries, neoplastic - ENDOMETRIAL ADENOCARCINOMA, 2.4 CM WITH SUPERFICIAL MYOMETRIAL INVASION. - FOCAL LYMPHATIC VASCULAR INVOLVEMENT BY TUMOR. - CERVIX, BILATERAL FALLOPIAN TUBES AND OVARIES FREE OF TUMOR.  Past/Anticipated interventions by Gyn/Onc surgery, if any: 02/25/15 -Procedure: XI ROBOTIC ASSISTED TOTAL HYSTERECTOMY WITH BILATERAL SALPINGO OOPHORECTOMY WITH SENTINAL LYMPH NODE MAPPING;  Surgeon: [name], MD;  Location: [facility];  Service: Gynecology;  Laterality: Bilateral;   Past/Anticipated interventions by medical oncology, if any: none  Weight changes, if any: no  Bowel/Bladder complaints, if any: denies bowel and bladder issues  Nausea/Vomiting, if any: no  Pain issues, if any:  no  SAFETY ISSUES:  Prior radiation? no  Pacemaker/ICD? no  Possible current pregnancy? no  Is the patient on methotrexate? no  Current Complaints / other details:  Dr. Skeet Latch is recommending vaginal brachytherapy.   Patient is here with her husband.  BP 139/77 mmHg  Pulse 80  Temp(Src) 98.2 F (36.8 C) (Oral)  Ht 5\' 7"  (1.702 m)  Wt 182 lb 1.6 oz (82.6 kg)  BMI 28.51 kg/m2

## 2015-03-25 ENCOUNTER — Ambulatory Visit
Admission: RE | Admit: 2015-03-25 | Discharge: 2015-03-25 | Disposition: A | Discharge status: Home / Self Care | Payer: PPO | Source: Ambulatory Visit | Attending: Radiation Oncology | Admitting: Radiation Oncology

## 2015-03-25 ENCOUNTER — Encounter: Payer: Self-pay | Admitting: Radiation Oncology

## 2015-03-25 VITALS — BP 139/77 | HR 80 | Temp 98.2°F | Ht 67.0 in | Wt 182.1 lb

## 2015-03-25 DIAGNOSIS — R51 Headache: Secondary | ICD-10-CM | POA: Insufficient documentation

## 2015-03-25 DIAGNOSIS — M8580 Other specified disorders of bone density and structure, unspecified site: Secondary | ICD-10-CM | POA: Diagnosis not present

## 2015-03-25 DIAGNOSIS — R350 Frequency of micturition: Secondary | ICD-10-CM | POA: Insufficient documentation

## 2015-03-25 DIAGNOSIS — E785 Hyperlipidemia, unspecified: Secondary | ICD-10-CM | POA: Diagnosis not present

## 2015-03-25 DIAGNOSIS — Z90710 Acquired absence of both cervix and uterus: Secondary | ICD-10-CM | POA: Diagnosis not present

## 2015-03-25 DIAGNOSIS — C541 Malignant neoplasm of endometrium: Secondary | ICD-10-CM

## 2015-03-25 DIAGNOSIS — R3915 Urgency of urination: Secondary | ICD-10-CM | POA: Diagnosis not present

## 2015-03-25 DIAGNOSIS — K219 Gastro-esophageal reflux disease without esophagitis: Secondary | ICD-10-CM | POA: Insufficient documentation

## 2015-03-25 DIAGNOSIS — Z9079 Acquired absence of other genital organ(s): Secondary | ICD-10-CM | POA: Diagnosis not present

## 2015-03-25 DIAGNOSIS — Z90722 Acquired absence of ovaries, bilateral: Secondary | ICD-10-CM | POA: Diagnosis not present

## 2015-03-25 DIAGNOSIS — Z510 Encounter for antineoplastic radiation therapy: Secondary | ICD-10-CM | POA: Insufficient documentation

## 2015-03-25 NOTE — Progress Notes (Signed)
Radiation Oncology         [phone] ________________________________  Initial Outpatient Consultation  Name: Erica Moyer MRN: [id number]  Date: 03/25/2015  DOB: [birth year]  [id number] S, MD  [name], MD   REFERRING PHYSICIAN: [name], MD  DIAGNOSIS:  Stage IA grade 3 endometrial cancer with LVI  HISTORY OF PRESENT ILLNESS::Erica Moyer is a 65 y.o. female who presents today with a diagnosis of endometrial cancer. Her symptoms first started in May 2016 when she had an episode of vaginal spotting that stopped for a while. The spotting started to increase again and she started having vaginal discharge and bleeding. She had surgery 02/25/2015, revealing 0/7 positive lymph nodes, uterine  revealing endometrial adenocarcinoma. She had a robotic assisted hysterectomy with bilateral salpingo oophorectomy 02/25/2015. She denies weight changes, bowel and bladder issues, nausea and vomiting, and pain. She mentions that she does have sleep problems. She is here with her husband today.  PREVIOUS RADIATION THERAPY: No  PAST MEDICAL HISTORY:  has a past medical history of GERD (gastroesophageal reflux disease); Complication of anesthesia; PONV (postoperative nausea and vomiting); Hyperlipidemia; Headache; Osteopenia; Frequency of urination; Urgency incontinence; and Cancer ([place]).    PAST SURGICAL HISTORY: Past Surgical History  Procedure Laterality Date  . Tubal ligation      1986  . Cholecystectomy    . Robotic assisted total hysterectomy with bilateral salpingo oopherectomy Bilateral 02/25/2015    Procedure: XI ROBOTIC ASSISTED TOTAL HYSTERECTOMY WITH BILATERAL SALPINGO OOPHORECTOMY WITH SENTINAL LYMPH NODE MAPPING;  Surgeon: [name], MD;  Location: [facility];  Service: Gynecology;  Laterality: Bilateral;    FAMILY HISTORY: family history includes Anesthesia problems in her mother; Cancer in her paternal grandmother; Hypertension in her father and mother;  Stroke in her father.  SOCIAL HISTORY:  reports that she has never smoked. She does not have any smokeless tobacco history on file. She reports that she does not drink alcohol or use illicit drugs.  ALLERGIES: Review of patient's allergies indicates no known allergies.  MEDICATIONS:  Current Outpatient Prescriptions  Medication Sig Dispense Refill  . Calcium Carbonate-Vitamin D (CALTRATE 600+D PO) Take 600 mg by mouth every morning. Reported on 03/18/2015    . Cetirizine HCl (ZYRTEC ALLERGY) 10 MG CAPS Take 10 mg by mouth every morning.    Marland Kitchen ibuprofen (ADVIL,MOTRIN) 200 MG tablet Take 200 mg by mouth every 6 (six) hours as needed (pain).     . mometasone (NASONEX) 50 MCG/ACT nasal spray INHALE 1 SPRAYS IN EACH NOSTRIL DAILY AS NEEDED for congestion  12  . Multiple Vitamin (MULTIVITAMIN) tablet Take 1 tablet by mouth daily. Reported on 03/18/2015    . rosuvastatin (CRESTOR) 10 MG tablet Take 10 mg by mouth at bedtime.   3  . alendronate (FOSAMAX) 35 MG tablet Take 35 mg by mouth once a week. Reported on 03/25/2015  3  . calcium carbonate (TUMS - DOSED IN MG ELEMENTAL CALCIUM) 500 MG chewable tablet Chew 1 tablet by mouth 3 (three) times daily as needed for indigestion or heartburn. Reported on 03/25/2015    . traMADol (ULTRAM) 50 MG tablet Take 1 tablet (50 mg total) by mouth every 6 (six) hours as needed. (Patient not taking: Reported on 03/18/2015) 30 tablet 0   No current facility-administered medications for this encounter.    REVIEW OF SYSTEMS:  A 15 point review of systems is documented in the electronic medical record. This was obtained by the nursing staff. However, I reviewed this with the  patient to discuss relevant findings and make appropriate changes.  Pertinent items are noted in HPI.   PHYSICAL EXAM:  height is 5\' 7"  (1.702 m) and weight is 182 lb 1.6 oz (82.6 kg). Her oral temperature is 98.2 F (36.8 C). Her blood pressure is 139/77 and her pulse is 80.   General: Alert and oriented,  in no acute distress HEENT: Head is normocephalic. Extraocular movements are intact. Oropharynx is clear. Neck: Neck is supple, no palpable cervical or supraclavicular lymphadenopathy. Heart: Regular in rate and rhythm with no murmurs, rubs, or gallops. Chest: Clear to auscultation bilaterally, with no rhonchi, wheezes, or rales. Abdomen: Soft, nontender, nondistended, with no rigidity or guarding. She has five small scar on her abdomen from her laparoscopic procedure. They are healing well with no signs of infection. Extremities: No cyanosis or edema. Lymphatics: see Neck Exam Skin: No concerning lesions.  Musculoskeletal: symmetric strength and muscle tone throughout. Psychiatric: Judgment and insight are intact. Affect is appropriate.  Pelvic exam deferred until planning session day.  ECOG = 1  LABORATORY DATA:  Lab Results  Component Value Date   WBC 8.9 02/26/2015   HGB 12.5 02/26/2015   HCT 39.3 02/26/2015   MCV 87.9 02/26/2015   PLT 187 02/26/2015   NEUTROABS 2.9 02/23/2015   Lab Results  Component Value Date   NA 144 02/26/2015   K 4.2 02/26/2015   CL 110 02/26/2015   CO2 24 02/26/2015   GLUCOSE 103* 02/26/2015   CREATININE 0.78 02/26/2015   CALCIUM 9.1 02/26/2015      RADIOGRAPHY: No results found.    IMPRESSION: Erica Moyer is a 65 yo female with stage IA FIGO grade III endometrial cancer with LVI.  The patient is at risk for vaginal cuff recurrence and I would agree with Dr. Leone Brand recommendations for vaginal vault brachytherapy.  PLAN: We discussed her diagnosis and options for treatment. We discussed the possible side effects of vaginal brachytherapy. We discussed long term side effects of radiation therapy. This procedure has been fully reviewed with the patient and written informed consent has been obtained.  She has a follow up appointment scheduled with Dr. Skeet Latch 06/17/2015. She will be scheduled for her planning appointment in a couple weeks.  Treatment is to begin approximately 6 weeks postop      ------------------------------------------------  Blair Promise, PhD, MD    This document serves as a record of services personally performed by Gery Pray, MD. It was created on his behalf by Lendon Collar, a trained medical scribe. The creation of this record is based on the scribe's personal observations and the provider's statements to them. This document has been checked and approved by the attending provider.

## 2015-03-26 NOTE — Addendum Note (Signed)
Encounter addended by: Jacqulyn Liner, RN on: 03/26/2015 12:12 PM<BR>     Documentation filed: Charges VN

## 2015-03-30 ENCOUNTER — Telehealth: Payer: Self-pay | Admitting: *Deleted

## 2015-03-30 NOTE — Telephone Encounter (Signed)
CALLED PATIENT TO INFORM OF HDR VAG. CUFF CASE , SPOKE WITH PATIENT AND SHE IS AWARE OF THESE APPTS. 

## 2015-04-07 ENCOUNTER — Telehealth: Payer: Self-pay | Admitting: *Deleted

## 2015-04-07 NOTE — Telephone Encounter (Signed)
CALLED PATIENT TO REMIND OF HDR VAG. CUFF CASE FOR 04-08-15, LVM FOR A RETURN CALL

## 2015-04-08 ENCOUNTER — Ambulatory Visit
Admission: RE | Admit: 2015-04-08 | Discharge: 2015-04-08 | Disposition: A | Discharge status: Home / Self Care | Payer: PPO | Source: Ambulatory Visit | Attending: Radiation Oncology | Admitting: Radiation Oncology

## 2015-04-08 ENCOUNTER — Encounter: Payer: Self-pay | Admitting: Radiation Oncology

## 2015-04-08 ENCOUNTER — Ambulatory Visit: Admission: RE | Admit: 2015-04-08 | Payer: PPO | Source: Ambulatory Visit | Admitting: Radiation Oncology

## 2015-04-08 ENCOUNTER — Telehealth: Payer: Self-pay | Admitting: *Deleted

## 2015-04-08 ENCOUNTER — Ambulatory Visit: Payer: PPO | Admitting: Radiation Oncology

## 2015-04-08 VITALS — BP 131/67 | HR 81 | Temp 98.2°F | Ht 67.0 in | Wt 180.0 lb

## 2015-04-08 DIAGNOSIS — C541 Malignant neoplasm of endometrium: Secondary | ICD-10-CM

## 2015-04-08 NOTE — Progress Notes (Signed)
Please see the Nurse Progress Note in the MD Initial Consult Encounter for this patient. 

## 2015-04-08 NOTE — Telephone Encounter (Signed)
Notified pt of appointment with Dr. Skeet Latch on March 2. Pt agreed with time and date.

## 2015-04-08 NOTE — Progress Notes (Signed)
  Radiation Oncology         [phone] ________________________________  Name: Erica Moyer MRN: [id number]  Date: 04/08/2015  DOB: 17-Nov-1950  Re-evaluation Note  CC: [name], MD  [name], MD   Diagnosis: Stage IA grade 3 endometrial cancer with LVI  Interval Since Last Radiation: N/A  Narrative:  The patient returns today for planning and her first high dose rate treatment. She reports having occasional sharp pains in her pelvic area that last for a few seconds. She reports having a small amount of vaginal bleeding last Tuesday and again yesterday. She reports it was after she emptied her bladder. She reports having urinary frequency. She denies any bowel issues and her last bowel movement was this morning.  ALLERGIES:  has No Known Allergies.  Meds: Current Outpatient Prescriptions  Medication Sig Dispense Refill  . calcium carbonate (TUMS - DOSED IN MG ELEMENTAL CALCIUM) 500 MG chewable tablet Chew 1 tablet by mouth 3 (three) times daily as needed for indigestion or heartburn. Reported on 03/25/2015    . Calcium Carbonate-Vitamin D (CALTRATE 600+D PO) Take 600 mg by mouth every morning. Reported on 03/18/2015    . Cetirizine HCl (ZYRTEC ALLERGY) 10 MG CAPS Take 10 mg by mouth every morning.    Marland Kitchen ibuprofen (ADVIL,MOTRIN) 200 MG tablet Take 200 mg by mouth every 6 (six) hours as needed (pain).     . mometasone (NASONEX) 50 MCG/ACT nasal spray INHALE 1 SPRAYS IN EACH NOSTRIL DAILY AS NEEDED for congestion  12  . Multiple Vitamin (MULTIVITAMIN) tablet Take 1 tablet by mouth daily. Reported on 03/18/2015    . rosuvastatin (CRESTOR) 10 MG tablet Take 10 mg by mouth at bedtime.   3  . alendronate (FOSAMAX) 35 MG tablet Take 35 mg by mouth once a week. Reported on 04/08/2015  3  . traMADol (ULTRAM) 50 MG tablet Take 1 tablet (50 mg total) by mouth every 6 (six) hours as needed. (Patient not taking: Reported on 03/18/2015) 30 tablet 0   No current facility-administered  medications for this encounter.    Physical Findings: The patient is in no acute distress. Patient is alert and oriented.  height is 5\' 7"  (1.702 m) and weight is 180 lb (81.647 kg). Her oral temperature is 98.2 F (36.8 C). Her blood pressure is 131/67 and her pulse is 81. Her oxygen saturation is 100%.   On speculum exam the patient has a lot of granulation tissue at the vaginal cuff. On bimanual exam, the cuff appears to be intact. No pelvic masses were appreciated.  Lab Findings: Lab Results  Component Value Date   WBC 8.9 02/26/2015   HGB 12.5 02/26/2015   HCT 39.3 02/26/2015   MCV 87.9 02/26/2015   PLT 187 02/26/2015    Radiographic Findings: No results found.  Impression: Stage IA grade 3 endometrial cancer with LVI. It appears the patient has not healed adequately to start her vaginal brachytherapy treatments.  Plan: The patient will see Dr. Skeet Latch on March 2 for a detailed exam. Brachytherapy therapy will tentatively be re-scheduled two weeks from now.  ____________________________________  Blair Promise, PhD, MD  This document serves as a record of services personally performed by Gery Pray, MD. It was created on his behalf by Darcus Austin, a trained medical scribe. The creation of this record is based on the scribe's personal observations and the provider's statements to them. This document has been checked and approved by the attending provider.

## 2015-04-08 NOTE — Progress Notes (Signed)
Erica Moyer here for follow up.  She reports having occasional sharp pains in her pelvic area that last for a few second.  She reports having a small amount of vaginal bleeding last Tuesday and then again yesterday.  She reports it was after she emptied her bladder.  She reports having urinary frequency.  She denies having any bowel issues and her last bm was this morning.  BP 131/67 mmHg  Pulse 81  Temp(Src) 98.2 F (36.8 C) (Oral)  Ht 5\' 7"  (1.702 m)  Wt 180 lb (81.647 kg)  BMI 28.19 kg/m2  SpO2 100%

## 2015-04-08 NOTE — Telephone Encounter (Signed)
Called patient to inform of New HDR Vag. Cuff Case on 04-20-15, spoke with patient and she is aware of this case and all the appts.

## 2015-04-09 ENCOUNTER — Encounter: Payer: Self-pay | Admitting: Oncology

## 2015-04-12 ENCOUNTER — Encounter: Payer: Self-pay | Admitting: Oncology

## 2015-04-14 ENCOUNTER — Telehealth: Payer: Self-pay | Admitting: Gynecologic Oncology

## 2015-04-14 NOTE — Telephone Encounter (Signed)
Consult Note: Gyn-Onc  GYN Physician  Dr. Carlena Bjornstad   Chief Complaint:   Stage IA endometrial cancer.  Post op check, treatment planning.  Assessment/Plan:  [name]  is a 65 y.o.  year old with grade 2 endometrial cancer. S/P endometrial cancer staging 02/2015.  10% myoinvasion, grade 2 with LVSI.  S/p vaginal brachytherapy Signs and symptoms of recurrence discussed Follow-up in 3 months with Dr. Sondra Come Follow-up in 6 months with Gyn Oncology     HPI: Erica Moyer is a 65 y.o. initially seen  at the request of Dr Carlena Bjornstad for grade 2 endometrial cancer.  [name] Care reports having an episode of vaginal spotting in May 2016. She initially thought this was due to urethral irritation as she has bladder prolapse. She then continued to spot through the summer and fall of 2016 and was evaluated by Dr. Marvel Plan on 01/19/2015 at which time a transvaginal ultrasound scan was performed which revealed a normal size uterus measuring 7.4 x 8.4 x 3.7 cm with a thickened endometrial stripe 15 mm. The ovaries were grossly normal. She then underwent a endometrial biopsy on 01/28/2015 which revealed FIGO grade 2 moderately differentiated endometrioid adenocarcinoma.  On 02/25/2015 she underwent [place] Right SNL and left PLND  1. Lymph node, sentinel, biopsy, right obturator sentinel - ONE BENIGN LYMPH NODE (0/1). 2. Lymph nodes, regional resection, left pelvic - SIX BENIGN LYMPH NODES (0/6). 3. Uterus +/- tubes/ovaries, neoplastic - ENDOMETRIAL ADENOCARCINOMA, 2.4 CM WITH SUPERFICIAL MYOMETRIAL INVASION. - FOCAL LYMPHATIC VASCULAR INVOLVEMENT BY TUMOR. - CERVIX, BILATERAL FALLOPIAN TUBES AND OVARIES FREE OF TUMOR. Microscopic Comment 3. ONCOLOGY TABLE-UTERUS, CARCINOMA OR CARCINOSARCOMA Specimen: Uterus with bilateral fallopian tubes and ovaries, right obturator sentinel lymph node and left pelvic lymph node. Procedure: Hysterectomy with bilateral  salpingo-oophorectomy, right obturator sentinel lymph node and left pelvic regional resection. Lymph node sampling performed: Yes Specimen integrity: Intact. Maximum tumor size: 2.4 cm Histologic type: Endometrioid with squamous differentiation. Grade: 3 Myometrial invasion: 0.2 cm where myometrium is 2 cm in thickness Cervical stromal involvement: No Extent of involvement of other organs: None detected. Lymph - vascular invasion: Present. Lymph nodes: # examined 7; # positive 0 TNM code: pT1a, pN0 FIGO Stage (based on pathologic findings, needs clinical correlation): IA  Vaginal vault brachytherapy completed  Review of Systems:  Constitutional  Feels well,    Cardiovascular  No chest pain, shortness of breath, or edema  Pulmonary  No cough or wheeze.  Gastro Intestinal  No nausea, vomitting, or diarrhoea. No bright red blood per rectum, no abdominal pain, no change in bowel movement, or constipation.  Genito Urinary  No frequency, urgency, dysuria, no vaginal bleeding. Musculo Skeletal  No myalgia, arthralgia, joint swelling or pain no changes in sensation or motor function Neurologic  No weakness, numbness, change in gait,  Psychology  No depression, anxiety, insomnia.     Social Hx:   Social History   Social History  . Marital Status: Married    Spouse Name: N/A  . Number of Children: 1  . Years of Education: N/A   Occupational History  . retired Pharmacist, hospital    Social History Main Topics  . Smoking status: Never Smoker   . Smokeless tobacco: Not on file  . Alcohol Use: No  . Drug Use: No  . Sexual Activity: Not on file   Other Topics Concern  . Not on file   Social History Narrative    Past Surgical Hx:  Past Surgical  History  Procedure Laterality Date  . Tubal ligation      1986  . Cholecystectomy    . Robotic assisted total hysterectomy with bilateral salpingo oopherectomy Bilateral 02/25/2015    Procedure: XI ROBOTIC ASSISTED TOTAL HYSTERECTOMY  WITH BILATERAL SALPINGO OOPHORECTOMY WITH SENTINAL LYMPH NODE MAPPING;  Surgeon: [name], MD;  Location: [facility];  Service: Gynecology;  Laterality: Bilateral;    Past Medical Hx:  Past Medical History  Diagnosis Date  . GERD (gastroesophageal reflux disease)   . Complication of anesthesia     slow to wake up  . PONV (postoperative nausea and vomiting)   . Hyperlipidemia   . Headache     hx migraines  . Osteopenia   . Frequency of urination   . Urgency incontinence   . Cancer [facility])     endometrial cancer    Past Gynecological History:  None. SVD x 1 No LMP recorded. Patient is postmenopausal.  Family Hx:  Family History  Problem Relation Age of Onset  . Anesthesia problems Mother   . Hypertension Mother   . Hypertension Father   . Stroke Father   . Cancer Paternal Grandmother     not sure what type    Vitals:  Blood pressure 136/60, pulse 70, temperature 98.3 F (36.8 C), temperature source Oral, resp. rate 18, height 5\' 7"  (1.702 m), weight 180 lb 1.6 oz (81.693 kg), SpO2 100 %.  Physical Exam: WD in NAD Lungs  Clear to auscultation bilateraly,  Good air movement.  Skin  No rash/lesions/breakdown  Psychiatry  Alert and oriented to person, place, and time  Abdomen  Normoactive bowel sounds, abdomen soft, non-tender and overweight without evidence of hernia.  Back No CVA tenderness Genito Urinary  Vulva/vagina: Normal external female genitalia.  No lesions. No discharge or bleeding. Cuff intact, no cul de sac masses or tenderness  Bladder/urethra: prolapse of mid anterior vagina to introitus  Vagina: normal Extremities  No bilateral cyanosis, clubbing or edema.

## 2015-04-15 ENCOUNTER — Ambulatory Visit: Payer: PPO | Attending: Gynecologic Oncology | Admitting: Gynecologic Oncology

## 2015-04-15 ENCOUNTER — Ambulatory Visit: Payer: PPO | Admitting: Radiation Oncology

## 2015-04-15 ENCOUNTER — Telehealth: Payer: Self-pay | Admitting: Oncology

## 2015-04-15 ENCOUNTER — Encounter: Payer: Self-pay | Admitting: Radiation Oncology

## 2015-04-15 ENCOUNTER — Encounter: Payer: Self-pay | Admitting: Gynecologic Oncology

## 2015-04-15 VITALS — BP 133/72 | HR 82 | Temp 98.2°F | Resp 18 | Ht 67.0 in | Wt 182.0 lb

## 2015-04-15 DIAGNOSIS — M8580 Other specified disorders of bone density and structure, unspecified site: Secondary | ICD-10-CM | POA: Insufficient documentation

## 2015-04-15 DIAGNOSIS — R350 Frequency of micturition: Secondary | ICD-10-CM | POA: Insufficient documentation

## 2015-04-15 DIAGNOSIS — IMO0002 Reserved for concepts with insufficient information to code with codable children: Secondary | ICD-10-CM | POA: Insufficient documentation

## 2015-04-15 DIAGNOSIS — C541 Malignant neoplasm of endometrium: Secondary | ICD-10-CM | POA: Diagnosis not present

## 2015-04-15 DIAGNOSIS — E785 Hyperlipidemia, unspecified: Secondary | ICD-10-CM | POA: Insufficient documentation

## 2015-04-15 DIAGNOSIS — R51 Headache: Secondary | ICD-10-CM | POA: Diagnosis not present

## 2015-04-15 DIAGNOSIS — K219 Gastro-esophageal reflux disease without esophagitis: Secondary | ICD-10-CM | POA: Insufficient documentation

## 2015-04-15 DIAGNOSIS — T8131XA Disruption of external operation (surgical) wound, not elsewhere classified, initial encounter: Secondary | ICD-10-CM | POA: Insufficient documentation

## 2015-04-15 DIAGNOSIS — T81328A Disruption or dehiscence of closure of other specified internal operation (surgical) wound, initial encounter: Secondary | ICD-10-CM | POA: Insufficient documentation

## 2015-04-15 NOTE — Patient Instructions (Addendum)
NO HEAVY LIFTING OR STRAINING.  Plan to follow up in two weeks or sooner if needed.

## 2015-04-15 NOTE — Telephone Encounter (Signed)
Erica Archer, NP called and said that Erica Moyer has a vaginal cuff separation and will not be ready for radiation for 1 month.

## 2015-04-15 NOTE — Progress Notes (Signed)
Consult Note: Gyn-Onc  GYN Physician  Dr. Carlena Bjornstad   Chief Complaint:   Stage IA endometrial cancer.  Post op check, treatment planning.  Assessment/Plan:  [name]  is a 65 y.o.  year old with grade 2 endometrial cancer. S/P endometrial cancer staging 02/2015.  10% myoinvasion, grade 2 with LVSI.  Now with evidence of vaginal cuff separation. Peritoneal tissue is evident with no evidence of evisceration. Counseled against heavy lifting and pelvic rest. She was advised to call if there is a feeling of fullness within the vagina or leakage of liquid from the vagina.  [place] and her husband were counseled that if there is evidence of evisceration surgical repair would be the next step   Follow-up in 2 weeks with Gyn Oncology [place] vaginal cuff brachytherapy for approximately 1 month      HPI: Erica Moyer is a 65 y.o. initially seen  at the request of Dr Carlena Bjornstad for grade 2 endometrial cancer.  [name] reports having an episode of vaginal spotting in May 2016. She initially thought this was due to urethral irritation as she has bladder prolapse. She then continued to spot through the summer and fall of 2016 and was evaluated by Dr. Marvel Plan on 01/19/2015 at which time a transvaginal ultrasound scan was performed which revealed a normal size uterus measuring 7.4 x 8.4 x 3.7 cm with a thickened endometrial stripe 15 mm. The ovaries were grossly normal. She then underwent a endometrial biopsy on 01/28/2015 which revealed FIGO grade 2 moderately differentiated endometrioid adenocarcinoma.  On 02/25/2015 she underwent [place] Right SNL and left PLND  1. Lymph node, sentinel, biopsy, right obturator sentinel - ONE BENIGN LYMPH NODE (0/1). 2. Lymph nodes, regional resection, left pelvic - SIX BENIGN LYMPH NODES (0/6). 3. Uterus +/- tubes/ovaries, neoplastic - ENDOMETRIAL ADENOCARCINOMA, 2.4 CM WITH SUPERFICIAL MYOMETRIAL INVASION. -  FOCAL LYMPHATIC VASCULAR INVOLVEMENT BY TUMOR. - CERVIX, BILATERAL FALLOPIAN TUBES AND OVARIES FREE OF TUMOR. Microscopic Comment 3. ONCOLOGY TABLE-UTERUS, CARCINOMA OR CARCINOSARCOMA Specimen: Uterus with bilateral fallopian tubes and ovaries, right obturator sentinel lymph node and left pelvic lymph node. Procedure: Hysterectomy with bilateral salpingo-oophorectomy, right obturator sentinel lymph node and left pelvic regional resection. Lymph node sampling performed: Yes Specimen integrity: Intact. Maximum tumor size: 2.4 cm Histologic type: Endometrioid with squamous differentiation. Grade: 3 Myometrial invasion: 0.2 cm where myometrium is 2 cm in thickness Cervical stromal involvement: No Extent of involvement of other organs: None detected. Lymph - vascular invasion: Present. Lymph nodes: # examined 7; # positive 0 TNM code: pT1a, pN0 FIGO Stage (based on pathologic findings, needs clinical correlation): [name] reports an episode of vaginal bleeding 19 days ago. Over the last week she has had 2 further episodes. The guys a gush of fluid denies mass in the vagina and no fever or chills.  Seen by Dr. Freddi Che earlier this week for vaginal brachytherapy and he recommended GYN evaluation  Review of Systems:  Constitutional  Feels well,   anxious about recurrence Gastro Intestinal  No nausea, vomitting, or diarrhoea. No bright red blood per rectum, no abdominal pain, no change in bowel movement, or constipation.  Genito Urinary  No frequency, urgency, dysuria, vaginal bleeding on several occasions over the last 10 days. Musculo Skeletal  No myalgia, arthralgia, joint swelling or pain no changes in sensation or motor function Neurologic  No weakness, numbness, change in gait,  Psychology  No depression, anxiety, insomnia.  Social Hx:   Social History   Social History  . Marital Status: Married    Spouse Name: N/A  . Number of Children: 1  . Years of  Education: N/A   Occupational History  . retired Pharmacist, hospital    Social History Main Topics  . Smoking status: Never Smoker   . Smokeless tobacco: Not on file  . Alcohol Use: No  . Drug Use: No  . Sexual Activity: Not on file   Other Topics Concern  . Not on file   Social History Narrative    Past Surgical Hx:  Past Surgical History  Procedure Laterality Date  . Tubal ligation      1986  . Cholecystectomy    . Robotic assisted total hysterectomy with bilateral salpingo oopherectomy Bilateral 02/25/2015    Procedure: XI ROBOTIC ASSISTED TOTAL HYSTERECTOMY WITH BILATERAL SALPINGO OOPHORECTOMY WITH SENTINAL LYMPH NODE MAPPING;  Surgeon: [name], MD;  Location: [facility];  Service: Gynecology;  Laterality: Bilateral;    Past Medical Hx:  Past Medical History  Diagnosis Date  . GERD (gastroesophageal reflux disease)   . Complication of anesthesia     slow to wake up  . PONV (postoperative nausea and vomiting)   . Hyperlipidemia   . Headache     hx migraines  . Osteopenia   . Frequency of urination   . Urgency incontinence   . Cancer [facility])     endometrial cancer    Past Gynecological History:  None. SVD x 1 No LMP recorded. Patient is postmenopausal.  Family Hx:  Family History  Problem Relation Age of Onset  . Anesthesia problems Mother   . Hypertension Mother   . Hypertension Father   . Stroke Father   . Cancer Paternal Grandmother     not sure what type    Vitals:  Blood pressure 133/72, pulse 82, temperature 98.2 F (36.8 C), resp. rate 18, height 5\' 7"  (1.702 m), weight 182 lb (82.555 kg), SpO2 100 %.  Physical Exam: WD in NAD Lungs  Clear to auscultation bilateraly,  Good air movement.  Skin  No rash/lesions/breakdown  Psychiatry  Alert and oriented to person, place, and time, Very anxious about the examination Abdomen  Normoactive bowel sounds, abdomen soft. No tenderness erythema or masses at site of laparoscopic incisions Back No CVA  tenderness Genito Urinary  Vulva/vagina: Normal external female genitalia.  No lesions. No discharge or bleeding. Vaginal cuff separation appreciated. Peritoneal lining over the apex without peristalsis and no evidence of small bowel evisceration. Edges of the vaginal cough friable. Silver nitrate applied  Bladder/urethra: prolapse of mid anterior vagina to introitus  Vagina: normal Extremities  No bilateral cyanosis, clubbing or edema.

## 2015-04-16 ENCOUNTER — Telehealth: Payer: Self-pay | Admitting: *Deleted

## 2015-04-16 NOTE — Telephone Encounter (Signed)
CALLED PATIENT TO INFORM THAT HDR VAG. CUFF CASE HAS BEEN CANCELLED DUE TO NOT HEALING COMPLETELY, WAITING FOR DR. KINARD TO DIRECT FOR RESCHEDULING, SPOKE WITH PATIENT AND SHE IS AWARE OF THIS

## 2015-04-20 ENCOUNTER — Ambulatory Visit: Payer: PPO | Admitting: Radiation Oncology

## 2015-04-20 ENCOUNTER — Ambulatory Visit
Admission: RE | Admit: 2015-04-20 | Discharge: 2015-04-20 | Disposition: A | Discharge status: Home / Self Care | Payer: PPO | Source: Ambulatory Visit | Attending: Radiation Oncology | Admitting: Radiation Oncology

## 2015-04-20 ENCOUNTER — Ambulatory Visit: Payer: PPO

## 2015-04-21 ENCOUNTER — Encounter: Payer: Self-pay | Admitting: Radiation Oncology

## 2015-04-22 ENCOUNTER — Encounter: Payer: Self-pay | Admitting: Radiation Oncology

## 2015-04-27 ENCOUNTER — Ambulatory Visit: Payer: PPO | Admitting: Radiation Oncology

## 2015-04-29 ENCOUNTER — Encounter: Payer: Self-pay | Admitting: Radiation Oncology

## 2015-04-30 ENCOUNTER — Encounter: Payer: Self-pay | Admitting: Gynecologic Oncology

## 2015-04-30 ENCOUNTER — Ambulatory Visit: Payer: PPO | Attending: Gynecologic Oncology | Admitting: Gynecologic Oncology

## 2015-04-30 VITALS — BP 128/67 | HR 75 | Temp 98.1°F | Resp 18 | Ht 67.0 in | Wt 184.4 lb

## 2015-04-30 DIAGNOSIS — Z9079 Acquired absence of other genital organ(s): Secondary | ICD-10-CM | POA: Insufficient documentation

## 2015-04-30 DIAGNOSIS — E785 Hyperlipidemia, unspecified: Secondary | ICD-10-CM | POA: Insufficient documentation

## 2015-04-30 DIAGNOSIS — R32 Unspecified urinary incontinence: Secondary | ICD-10-CM | POA: Insufficient documentation

## 2015-04-30 DIAGNOSIS — Z90722 Acquired absence of ovaries, bilateral: Secondary | ICD-10-CM | POA: Diagnosis not present

## 2015-04-30 DIAGNOSIS — K219 Gastro-esophageal reflux disease without esophagitis: Secondary | ICD-10-CM | POA: Diagnosis not present

## 2015-04-30 DIAGNOSIS — M8580 Other specified disorders of bone density and structure, unspecified site: Secondary | ICD-10-CM | POA: Insufficient documentation

## 2015-04-30 DIAGNOSIS — Z7189 Other specified counseling: Secondary | ICD-10-CM | POA: Diagnosis not present

## 2015-04-30 DIAGNOSIS — C541 Malignant neoplasm of endometrium: Secondary | ICD-10-CM | POA: Insufficient documentation

## 2015-04-30 DIAGNOSIS — N898 Other specified noninflammatory disorders of vagina: Secondary | ICD-10-CM | POA: Diagnosis not present

## 2015-04-30 DIAGNOSIS — Z90710 Acquired absence of both cervix and uterus: Secondary | ICD-10-CM | POA: Insufficient documentation

## 2015-04-30 MED ORDER — ESTROGENS, CONJUGATED 0.625 MG/GM VA CREA: 1.0000 | TOPICAL_CREAM | Freq: Every day | VAGINAL | Status: AC

## 2015-04-30 NOTE — Progress Notes (Signed)
Consult Note: Gyn-Onc  GYN Physician  Dr. Carlena Bjornstad   Chief Complaint:   Stage IA endometrial cancer, high intermediate risk factors.  treatment planning.  Assessment/Plan:  Ms. ANGELICAMARIE CARNATHAN  is a 65 y.o.  year old with grade 2 endometrial cancer. S/P endometrial cancer staging 02/2015.  10% myoinvasion, grade 2 with LVSI.  S/p vaginal cuff separation which delayed starting adjuvant radiation for high/intermediate risk factors.  Appears to be healing. Cuff in tact though somewhat granulation/ulceration at mucosa. Recommend 2 weeks of vaginal premarin nightly. Then can start vaginal brachytherapy.  Feel there is no indication for re-suture at this time because cuff is in tact enough that it would require re-opening to resuture and this would delay radiation by at least 6 weeks.  Follow-up in 2 weeks with Radiation Oncology  HPI: Berklie Olmeda is a 65 y.o. initially seen  at the request of Dr Carlena Bjornstad for grade 2 endometrial cancer.  Ms. Rojean [place] Terriquez reports having an episode of vaginal spotting in May 2016. She initially thought this was due to urethral irritation as she has bladder prolapse. She then continued to spot through the summer and fall of 2016 and was evaluated by Dr. Marvel Plan on 01/19/2015 at which time a transvaginal ultrasound scan was performed which revealed a normal size uterus measuring 7.4 x 8.4 x 3.7 cm with a thickened endometrial stripe 15 mm. The ovaries were grossly normal. She then underwent a endometrial biopsy on 01/28/2015 which revealed FIGO grade 2 moderately differentiated endometrioid adenocarcinoma.  On 02/25/2015 she underwent [place] Right SNL and left PLND  1. Lymph node, sentinel, biopsy, right obturator sentinel - ONE BENIGN LYMPH NODE (0/1). 2. Lymph nodes, regional resection, left pelvic - SIX BENIGN LYMPH NODES (0/6). 3. Uterus +/- tubes/ovaries, neoplastic - ENDOMETRIAL ADENOCARCINOMA, 2.4 CM WITH SUPERFICIAL  MYOMETRIAL INVASION. - FOCAL LYMPHATIC VASCULAR INVOLVEMENT BY TUMOR. - CERVIX, BILATERAL FALLOPIAN TUBES AND OVARIES FREE OF TUMOR. Microscopic Comment 3. ONCOLOGY TABLE-UTERUS, CARCINOMA OR CARCINOSARCOMA Specimen: Uterus with bilateral fallopian tubes and ovaries, right obturator sentinel lymph node and left pelvic lymph node. Procedure: Hysterectomy with bilateral salpingo-oophorectomy, right obturator sentinel lymph node and left pelvic regional resection. Lymph node sampling performed: Yes Specimen integrity: Intact. Maximum tumor size: 2.4 cm Histologic type: Endometrioid with squamous differentiation. Grade: 3 Myometrial invasion: 0.2 cm where myometrium is 2 cm in thickness Cervical stromal involvement: No Extent of involvement of other organs: None detected. Lymph - vascular invasion: Present. Lymph nodes: # examined 7; # positive 0 TNM code: pT1a, pN0 FIGO Stage (based on pathologic findings, needs clinical correlation): [place] reports an episode of vaginal bleeding in February 2017. She was diagnosed with vaginal cuff separation (not dehiscence) and was conservatively treated with pelvic rest and delay in starting radiation (brachy).  Interval Hx:  She reports mild, intermittent spotting. The frequency and volume of this is getting less over time. No leakage of fluid.  Review of Systems:  Constitutional  Feels well,   anxious about recurrence Gastro Intestinal  No nausea, vomitting, or diarrhoea. No bright red blood per rectum, no abdominal pain, no change in bowel movement, or constipation.  Genito Urinary  No frequency, urgency, dysuria, vaginal spotting intermittent Musculo Skeletal  No myalgia, arthralgia, joint swelling or pain no changes in sensation or motor function Neurologic  No weakness, numbness, change in gait,  Psychology  No depression, anxiety, insomnia.     Social Hx:   Social History   Social History  .  Marital Status:  Married    Spouse Name: N/A  . Number of Children: 1  . Years of Education: N/A   Occupational History  . retired Pharmacist, hospital    Social History Main Topics  . Smoking status: Never Smoker   . Smokeless tobacco: Not on file  . Alcohol Use: No  . Drug Use: No  . Sexual Activity: Not on file   Other Topics Concern  . Not on file   Social History Narrative    Past Surgical Hx:  Past Surgical History  Procedure Laterality Date  . Tubal ligation      1986  . Cholecystectomy    . Robotic assisted total hysterectomy with bilateral salpingo oopherectomy Bilateral 02/25/2015    Procedure: XI ROBOTIC ASSISTED TOTAL HYSTERECTOMY WITH BILATERAL SALPINGO OOPHORECTOMY WITH SENTINAL LYMPH NODE MAPPING;  Surgeon: [name], MD;  Location: [facility];  Service: Gynecology;  Laterality: Bilateral;    Past Medical Hx:  Past Medical History  Diagnosis Date  . GERD (gastroesophageal reflux disease)   . Complication of anesthesia     slow to wake up  . PONV (postoperative nausea and vomiting)   . Hyperlipidemia   . Headache     hx migraines  . Osteopenia   . Frequency of urination   . Urgency incontinence   . Cancer [facility])     endometrial cancer    Past Gynecological History:  None. SVD x 1 No LMP recorded. Patient is postmenopausal.  Family Hx:  Family History  Problem Relation Age of Onset  . Anesthesia problems Mother   . Hypertension Mother   . Hypertension Father   . Stroke Father   . Cancer Paternal Grandmother     not sure what type    Vitals:  Blood pressure 128/67, pulse 75, temperature 98.1 F (36.7 C), temperature source Oral, resp. rate 18, height 5\' 7"  (1.702 m), weight 184 lb 6.4 oz (83.643 kg), SpO2 100 %.  Physical Exam: WD in NAD Lungs  Clear to auscultation bilateraly,  Good air movement.  Skin  No rash/lesions/breakdown  Psychiatry  Alert and oriented to person, place, and time, Very anxious about the examination Abdomen  Normoactive bowel sounds,  abdomen soft. No tenderness erythema or masses at site of laparoscopic incisions Back No CVA tenderness Genito Urinary  Vulva/vagina: Normal external female genitalia.  No lesions. Vaginal cuff grossly in tact visually and palpably. The cuff has ulcerated/granulation tissue at the mucosa.  Bladder/urethra: prolapse of mid anterior vagina to introitus  Vagina: normal Extremities  No bilateral cyanosis, clubbing or edema.  Donaciano Eva, MD

## 2015-04-30 NOTE — Patient Instructions (Signed)
Plan to meet with Dr. Sondra Come to begin treatment in two weeks time.  You will receive a phone call from his office.  Use the premarin vaginal cream nightly for two weeks to assist with healing the vaginal tissues.

## 2015-05-04 ENCOUNTER — Telehealth: Payer: Self-pay | Admitting: *Deleted

## 2015-05-04 ENCOUNTER — Encounter: Payer: Self-pay | Admitting: Radiation Oncology

## 2015-05-04 NOTE — Telephone Encounter (Signed)
Called patient to inform of New HDR Vag. Cuff Case, spoke with patient and she is aware of these appts. 

## 2015-05-06 ENCOUNTER — Encounter: Payer: Self-pay | Admitting: Radiation Oncology

## 2015-05-11 ENCOUNTER — Encounter: Payer: Self-pay | Admitting: Radiation Oncology

## 2015-05-18 ENCOUNTER — Telehealth: Payer: Self-pay | Admitting: *Deleted

## 2015-05-18 ENCOUNTER — Encounter: Payer: Self-pay | Admitting: Radiation Oncology

## 2015-05-18 NOTE — Telephone Encounter (Signed)
CALLED PATIENT TO REMIND OF HDR VAG. CUFF CASE FOR 05-19-15, LVM FOR A RETURN CALL

## 2015-05-19 ENCOUNTER — Ambulatory Visit
Admission: RE | Admit: 2015-05-19 | Discharge: 2015-05-19 | Disposition: A | Discharge status: Home / Self Care | Payer: PPO | Source: Ambulatory Visit | Attending: Radiation Oncology | Admitting: Radiation Oncology

## 2015-05-19 ENCOUNTER — Encounter: Payer: Self-pay | Admitting: Radiation Oncology

## 2015-05-19 VITALS — BP 128/85 | HR 80 | Temp 98.4°F | Ht 67.0 in | Wt 184.8 lb

## 2015-05-19 DIAGNOSIS — C541 Malignant neoplasm of endometrium: Secondary | ICD-10-CM | POA: Diagnosis not present

## 2015-05-19 DIAGNOSIS — Z510 Encounter for antineoplastic radiation therapy: Secondary | ICD-10-CM | POA: Diagnosis not present

## 2015-05-19 NOTE — Progress Notes (Addendum)
Radiation Oncology         [phone] ________________________________  Name: Erica Moyer MRN: [id number]  Date: 05/19/2015  DOB: [birth year]  Vaginal brachytherapy procedure Note  CC: [name], MD  [name], MD   Diagnosis: Stage IA grade 3 endometrial cancer with LVI   Narrative:  The patient returns today for planning and her first high dose rate treatment. She reports having twinges of pain in her lower abdomen/bladder area that happens more at night since surgery. She reports having urinary frequency. She denies having any bowel issues. She denies having any vaginal bleeding. She has been using premarin cream. She did not use it last night. sHe has seen in gynecologic oncology twice for examination since my initial attempt at vaginal brachytherapy to ensure adequate healing of the vaginal cuff.  ALLERGIES:  has No Known Allergies.  Meds: Current Outpatient Prescriptions  Medication Sig Dispense Refill  . Cetirizine HCl (ZYRTEC ALLERGY) 10 MG CAPS Take 10 mg by mouth every morning.    . conjugated estrogens (PREMARIN) vaginal cream Place 1 Applicatorful vaginally daily.    Marland Kitchen ibuprofen (ADVIL,MOTRIN) 200 MG tablet Take 200 mg by mouth every 6 (six) hours as needed (pain). Reported on 04/15/2015    . mometasone (NASONEX) 50 MCG/ACT nasal spray INHALE 1 SPRAYS IN EACH NOSTRIL DAILY AS NEEDED for congestion  12  . rosuvastatin (CRESTOR) 10 MG tablet Take 10 mg by mouth at bedtime.   3  . calcium carbonate (TUMS - DOSED IN MG ELEMENTAL CALCIUM) 500 MG chewable tablet Chew 1 tablet by mouth 3 (three) times daily as needed for indigestion or heartburn. Reported on 05/19/2015    . Calcium Carbonate-Vitamin D (CALTRATE 600+D PO) Take 600 mg by mouth every morning. Reported on 05/19/2015    . Multiple Vitamin (MULTIVITAMIN) tablet Take 1 tablet by mouth daily. Reported on 05/19/2015     No current facility-administered medications for this encounter.    Physical  Findings: The patient is in no acute distress. Patient is alert and oriented.  height is 5\' 7"  (1.702 m) and weight is 184 lb 12.8 oz (83.825 kg). Her oral temperature is 98.4 F (36.9 C). Her blood pressure is 128/85 and her pulse is 80.    Lungs are clear to auscultation bilaterally. Heart has regular rate and rhythm. No palpable cervical, supraclavicular, or axillary adenopathy. Abdomen is soft, non-tender.  On specuclum exam, there are no ulcerations noted. On bimanual examination, the vaginal cuff is intact. There is a 1 cm area of smooth induration in the central cuff, possibly a cyst or scar tissue. No pelvic masses were appreciated.  The patient proceeded to undergo fitting for her custom vaginal cylinder. She will be treated with a 3 cm segmented cylinder. This arrangement distends the vaginal vault without undue discomfort..  Lab Findings: Lab Results  Component Value Date   WBC 8.9 02/26/2015   HGB 12.5 02/26/2015   HCT 39.3 02/26/2015   MCV 87.9 02/26/2015   PLT 187 02/26/2015    Radiographic Findings: No results found.  Impression: Stage IA grade 3 endometrial cancer with LVI. Successful fitting for vaginal brachytherapy.  Plan: The patient will proceed to the CT simulation suite for planning and her first treatment will be later today.  ____________________________________  Blair Promise, PhD, MD  This document serves as a record of services personally performed by Gery Pray, MD. It was created on his behalf by Darcus Austin, a trained medical scribe. The creation of this  record is based on the scribe's personal observations and the provider's statements to them. This document has been checked and approved by the attending provider.

## 2015-05-19 NOTE — Progress Notes (Signed)
  Radiation Oncology         [phone] ________________________________  Name: Erica Moyer MRN: [id number]  Date: 05/19/2015  DOB: 1950-12-09  CC: [name], MD  [name], MD  HDR BRACHYTHERAPY NOTE  DIAGNOSIS: Stage IA grade 3 endometrial cancer with LVI   Simple treatment device note:  Patient had construction of her custom vaginal cylinder. She will be treated with a 3 cm diameter segmented cylinder. This conforms to her anatomy without undue discomfort.  NARRATIVE:  The patient was brought to the HDR suite. Identity was confirmed. All relevant records and images related to the planned course of therapy were reviewed. The patient freely provided informed written consent to proceed with treatment after reviewing the details related to the planned course of therapy. The consent form was witnessed and verified by the simulation staff. Then, the patient was set-up in a stable reproducible supine position for radiation therapy. The patient's custom vaginal cylinder was placed in the proximal vagina. This was affixed to the CT/MR stabilization plate to prevent slippage. Patient tolerated the placement well.  Verification simulation note:  A fiducial marker was placed within the vaginal cylinder. An AP and lateral film was then obtained through the pelvis area. This documented accurate position of the vaginal cylinder for treatment.  HDR BRACHYTHERAPY TREATMENT  The remote afterloading device was affixed to the vaginal cylinder by catheter. Patient then proceeded to undergo her 1st high-dose-rate treatment directed at the proximal vagina. The patient was prescribed a dose of 6 gray to be delivered to the mucosal surface. Treatment length was 3 cm. Prescription was 6 gray to the mucosal surface. Patient was treated with 1 channel using 7 dwell positions. Treatment time was 353.8 seconds. Iridium 192 was the high-dose-rate source for treatment. The patient tolerated the  treatment well. After completion of her therapy, a radiation survey was performed documenting return of the iridium source into the [employer] safe.   PLAN: The patient will return next week for her 2nd high-dose-rate treatment. ________________________________  Blair Promise, PhD, MD   This document serves as a record of services personally performed by Gery Pray, MD. It was created on his behalf by Darcus Austin, a trained medical scribe. The creation of this record is based on the scribe's personal observations and the provider's statements to them. This document has been checked and approved by the attending provider.

## 2015-05-19 NOTE — Progress Notes (Signed)
  Radiation Oncology         [phone] ________________________________  Name: Erica Moyer MRN: [id number]  Date: 05/19/2015  DOB: [birth year]  SIMULATION AND TREATMENT PLANNING NOTE HDR BRACHYTHERAPY  DIAGNOSIS: Stage IA grade 3 endometrial cancer with LVI  NARRATIVE:  The patient was brought to the [place] suite.  Identity was confirmed.  All relevant records and images related to the planned course of therapy were reviewed.  The patient freely provided informed written consent to proceed with treatment after reviewing the details related to the planned course of therapy. The consent form was witnessed and verified by the simulation staff.  Then, the patient was set-up in a stable reproducible  supine position for radiation therapy.  CT images were obtained.  Surface markings were placed.  The CT images were loaded into the planning software.  Then the target and avoidance structures were contoured.  Treatment planning then occurred.  The radiation prescription was entered and confirmed.   I have requested : Brachytherapy Isodose Plan and Dosimetry Calculations to plan the radiation distribution.    PLAN:  The patient will receive 30 Gy in 5 fractions. The patient will be treated with iridium 192 as the high-dose-rate source. She will be treated with a 3 cm diameter segmented cylinder. Prescription will be to the mucosal surface. Treatment length will be 3 cm. ________________________________  Blair Promise, PhD, MD  This document serves as a record of services personally performed by Gery Pray, MD. It was created on his behalf by Darcus Austin, a trained medical scribe. The creation of this record is based on the scribe's personal observations and the provider's statements to them. This document has been checked and approved by the attending provider.

## 2015-05-19 NOTE — Progress Notes (Addendum)
Erica Moyer here for follow up.  She reports having twinges of pain in her lower abdomen/bladder area that happens more at night since surgery.  She reports having urinary frequency.  She denies having any bowel issues.  She denies having any vaginal bleeding.  She has been using premarin cream and is wondering if she can use it during radiation.  She did not use it last night.  BP 128/85 mmHg  Pulse 80  Temp(Src) 98.4 F (36.9 C) (Oral)  Ht 5\' 7"  (1.702 m)  Wt 184 lb 12.8 oz (83.825 kg)  BMI 28.94 kg/m2   Wt Readings from Last 3 Encounters:  05/19/15 184 lb 12.8 oz (83.825 kg)  04/30/15 184 lb 6.4 oz (83.643 kg)  04/15/15 182 lb (82.555 kg)

## 2015-05-19 NOTE — Progress Notes (Signed)
Please see the Nurse Progress Note in the MD Initial Consult Encounter for this patient. 

## 2015-05-20 ENCOUNTER — Telehealth: Payer: Self-pay | Admitting: Oncology

## 2015-05-20 NOTE — Telephone Encounter (Signed)
[facility] and advised her to use her premarin cream during radiation per Dr. Sondra Come.  Lorianne verbalized agreement and understanding.

## 2015-05-24 ENCOUNTER — Encounter: Payer: Self-pay | Admitting: Radiation Oncology

## 2015-05-25 ENCOUNTER — Encounter: Payer: Self-pay | Admitting: Radiation Oncology

## 2015-05-27 ENCOUNTER — Ambulatory Visit
Admission: RE | Admit: 2015-05-27 | Discharge: 2015-05-27 | Disposition: A | Discharge status: Home / Self Care | Payer: PPO | Source: Ambulatory Visit | Attending: Radiation Oncology | Admitting: Radiation Oncology

## 2015-05-27 DIAGNOSIS — C541 Malignant neoplasm of endometrium: Secondary | ICD-10-CM | POA: Diagnosis not present

## 2015-05-27 DIAGNOSIS — Z510 Encounter for antineoplastic radiation therapy: Secondary | ICD-10-CM | POA: Diagnosis not present

## 2015-05-27 NOTE — Progress Notes (Signed)
  Radiation Oncology [phone]) [phone] ________________________________  Name: Erica Doakes CallicuttMRN: [id number] Date: 4/13/2017DOB: [birth year]  CC: [name], MD [name], MD  HDR BRACHYTHERAPY NOTE  DIAGNOSIS: Stage IA grade 3 endometrial cancer with LVI  Simple treatment device note:  Patient had construction of her custom vaginal cylinder. She will be treated with a 3 cm diameter segmented cylinder. This conforms to her anatomy without undue discomfort.  NARRATIVE:  The patient was brought to the HDR suite. Identity was confirmed. All relevant records and images related to the planned course of therapy were reviewed. The patient freely provided informed written consent to proceed with treatment after reviewing the details related to the planned course of therapy. The consent form was witnessed and verified by the simulation staff. Then, the patient was set-up in a stable reproducible supine position for radiation therapy. The patient's custom vaginal cylinder was placed in the proximal vagina. This was affixed to the CT/MR stabilization plate to prevent slippage. Patient tolerated the placement well.  Verification simulation note:  A fiducial marker was placed within the vaginal cylinder. An AP and lateral film was then obtained through the pelvis area. This documented accurate position of the vaginal cylinder for treatment.  HDR BRACHYTHERAPY TREATMENT  The remote afterloading device was affixed to the vaginal cylinder by catheter. Patient then proceeded to undergo her 2nd high-dose-rate treatment directed at the proximal vagina. The patient was prescribed a dose of 6 gray to be delivered to the mucosal surface. Treatment length was 3 cm. Prescription was 6 gray to the mucosal surface. Patient was treated with 1 channel using 7 dwell positions. Treatment time was 164.9 seconds. Iridium 192 was the high-dose-rate source for  treatment. The patient tolerated the treatment well. After completion of her therapy, a radiation survey was performed documenting return of the iridium source into the GammaMed safe.  PLAN: The patient will return next week for her 3rd high-dose-rate treatment. ________________________________  Blair Promise, PhD, MD

## 2015-05-31 ENCOUNTER — Encounter: Payer: Self-pay | Admitting: Radiation Oncology

## 2015-05-31 ENCOUNTER — Telehealth: Payer: Self-pay | Admitting: *Deleted

## 2015-05-31 NOTE — Telephone Encounter (Signed)
CALLED PATIENT TO REMIND OF HDR [place] 06-01-15  @ 10 AM, SPOKE WITH PATIENT AND SHE IS AWARE OF THIS HDR [place].

## 2015-06-01 ENCOUNTER — Ambulatory Visit
Admission: RE | Admit: 2015-06-01 | Discharge: 2015-06-01 | Disposition: A | Discharge status: Home / Self Care | Payer: PPO | Source: Ambulatory Visit | Attending: Radiation Oncology | Admitting: Radiation Oncology

## 2015-06-01 DIAGNOSIS — C541 Malignant neoplasm of endometrium: Secondary | ICD-10-CM | POA: Diagnosis not present

## 2015-06-01 DIAGNOSIS — Z510 Encounter for antineoplastic radiation therapy: Secondary | ICD-10-CM | POA: Diagnosis not present

## 2015-06-01 NOTE — Progress Notes (Signed)
  Radiation Oncology         (336) [phone] ________________________________  Name: Erica Moyer MRN: [id number]  Date: 06/01/2015  DOB: April 11, 1950  CC: [name], MD  [name], MD  HDR BRACHYTHERAPY NOTE  DIAGNOSIS: Stage IA grade 3 endometrial cancer with LVI   Simple treatment device note:  Patient had construction of her custom vaginal cylinder. She will be treated with a 3 cm diameter segmented cylinder. This conforms to her anatomy without undue discomfort.  Vaginal brachytherapy procedure  The patient was brought to the [place] suite. Identity was confirmed. All relevant records and images related to the planned course of therapy were reviewed. The patient freely provided informed written consent to proceed with treatment after reviewing the details related to the planned course of therapy. The consent form was witnessed and verified by the simulation staff. Then, the patient was set-up in a stable reproducible supine position for radiation therapy. The patient's custom vaginal cylinder was placed in the proximal vagina. This was affixed to the CT/MR stabilization plate to prevent slippage. Patient tolerated the placement well.  Verification simulation note:  A fiducial marker was placed within the vaginal cylinder. An AP and lateral film was then obtained through the pelvis area. This documented accurate position of the vaginal cylinder for treatment.  HDR BRACHYTHERAPY TREATMENT  The remote afterloading device was affixed to the vaginal cylinder by catheter. Patient then proceeded to undergo her 3rd high-dose-rate treatment directed at the proximal vagina. The patient was prescribed a dose of 6 gray to be delivered to the mucosal surface. Treatment length was 3 cm. Patient was treated with 1 channel using 7 dwell positions. Treatment time was 172.9 seconds. Iridium 192 was the high-dose-rate source for treatment. The patient tolerated the treatment well. After completion  of her therapy, a radiation survey was performed documenting return of the iridium source into the GammaMed safe.   PLAN: The patient will return next week for her 4th  high-dose-rate treatment. ________________________________  Blair Promise, PhD, MD   This document serves as a record of services personally performed by Gery Pray, MD. It was created on his behalf by Darcus Austin, a trained medical scribe. The creation of this record is based on the scribe's personal observations and the provider's statements to them. This document has been checked and approved by the attending provider.

## 2015-06-07 ENCOUNTER — Encounter: Payer: Self-pay | Admitting: Radiation Oncology

## 2015-06-08 ENCOUNTER — Ambulatory Visit
Admission: RE | Admit: 2015-06-08 | Discharge: 2015-06-08 | Disposition: A | Discharge status: Home / Self Care | Payer: PPO | Source: Ambulatory Visit | Attending: Radiation Oncology | Admitting: Radiation Oncology

## 2015-06-08 DIAGNOSIS — Z510 Encounter for antineoplastic radiation therapy: Secondary | ICD-10-CM | POA: Diagnosis not present

## 2015-06-08 DIAGNOSIS — C541 Malignant neoplasm of endometrium: Secondary | ICD-10-CM | POA: Diagnosis not present

## 2015-06-08 NOTE — Progress Notes (Signed)
  Radiation Oncology         (336) [phone] ________________________________  Name: Erica Moyer MRN: [id number]  Date: 06/08/2015  DOB: Feb 25, 1950  CC: [name], MD  [name], MD  HDR BRACHYTHERAPY NOTE  DIAGNOSIS: Stage IA grade 3 endometrial cancer with LVI   Simple treatment device note:  Patient had construction of her custom vaginal cylinder. She will be treated with a 3 cm diameter segmented cylinder. This conforms to her anatomy without undue discomfort.  Vaginal brachytherapy procedure  The patient was brought to the [place] suite. Identity was confirmed. All relevant records and images related to the planned course of therapy were reviewed. The patient freely provided informed written consent to proceed with treatment after reviewing the details related to the planned course of therapy. The consent form was witnessed and verified by the simulation staff. Then, the patient was set-up in a stable reproducible supine position for radiation therapy. The patient's custom vaginal cylinder was placed in the proximal vagina. This was affixed to the CT/MR stabilization plate to prevent slippage. Patient tolerated the placement well.  Verification simulation note:  A fiducial marker was placed within the vaginal cylinder. An AP and lateral film was then obtained through the pelvis area. This documented accurate position of the vaginal cylinder for treatment.  HDR BRACHYTHERAPY TREATMENT  The remote afterloading device was affixed to the vaginal cylinder by catheter. Patient then proceeded to undergo her 4th high-dose-rate treatment directed at the proximal vagina. The patient was prescribed a dose of 6 gray to be delivered to the mucosal surface. Treatment length was 3 cm. Patient was treated with 1 channel using 7 dwell positions. Treatment time was 184.5 seconds. Iridium 192 was the high-dose-rate source for treatment. The patient tolerated the treatment well. After completion  of her therapy, a radiation survey was performed documenting return of the iridium source into the GammaMed safe.   PLAN: The patient will return for her 5th  high-dose-rate treatment on 06/11/15. ________________________________  Blair Promise, PhD, MD   This document serves as a record of services personally performed by Gery Pray, MD. It was created on his behalf by Darcus Austin, a trained medical scribe. The creation of this record is based on the scribe's personal observations and the provider's statements to them. This document has been checked and approved by the attending provider.

## 2015-06-11 ENCOUNTER — Ambulatory Visit
Admission: RE | Admit: 2015-06-11 | Discharge: 2015-06-11 | Disposition: A | Discharge status: Home / Self Care | Payer: PPO | Source: Ambulatory Visit | Attending: Radiation Oncology | Admitting: Radiation Oncology

## 2015-06-11 ENCOUNTER — Encounter: Payer: Self-pay | Admitting: Radiation Oncology

## 2015-06-11 DIAGNOSIS — C541 Malignant neoplasm of endometrium: Secondary | ICD-10-CM

## 2015-06-11 DIAGNOSIS — Z510 Encounter for antineoplastic radiation therapy: Secondary | ICD-10-CM | POA: Diagnosis not present

## 2015-06-11 NOTE — Progress Notes (Signed)
  Radiation Oncology         [phone]) [phone] ________________________________  Name: Erica Moyer MRN: [id number]   Date: 06/11/2015  DOB: 01-Dec-1950  CC: [name], MD  [name], MD  HDR BRACHYTHERAPY NOTE  DIAGNOSIS: Stage IA grade 3 endometrial cancer with LVI   Simple treatment device note:  Patient had construction of her custom vaginal cylinder. She will be treated with a 3 cm diameter segmented cylinder. This conforms to her anatomy without undue discomfort.  Vaginal brachytherapy procedure  The patient was brought to the [place] suite. Identity was confirmed. All relevant records and images related to the planned course of therapy were reviewed. The patient freely provided informed written consent to proceed with treatment after reviewing the details related to the planned course of therapy. The consent form was witnessed and verified by the simulation staff. Then, the patient was set-up in a stable reproducible supine position for radiation therapy. The patient's custom vaginal cylinder was placed in the proximal vagina. This was affixed to the CT/MR stabilization plate to prevent slippage. Patient tolerated the placement well.  Verification simulation note:  A fiducial marker was placed within the vaginal cylinder. An AP and lateral film was then obtained through the pelvis area. This documented accurate position of the vaginal cylinder for treatment.  HDR BRACHYTHERAPY TREATMENT  The remote afterloading device was affixed to the vaginal cylinder by catheter. Patient then proceeded to undergo her 5th high-dose-rate treatment directed at the proximal vagina. The patient was prescribed a dose of 6 gray to be delivered to the mucosal surface. Treatment length was 3 cm. Patient was treated with 1 channel using 7 dwell positions. Treatment time was 189.9 seconds. Iridium 192 was the high-dose-rate source for treatment. The patient tolerated the treatment well. After completion  of her therapy, a radiation survey was performed documenting return of the iridium source into the GammaMed safe.   PLAN: The patient completed her high-dose-rate treatments and will return for a follow up in 1 month. ________________________________  Blair Promise, PhD, MD   This document serves as a record of services personally performed by Gery Pray, MD. It was created on his behalf by Darcus Austin, a trained medical scribe. The creation of this record is based on the scribe's personal observations and the provider's statements to them. This document has been checked and approved by the attending provider.

## 2015-06-16 ENCOUNTER — Ambulatory Visit: Payer: PPO | Admitting: Radiation Oncology

## 2015-06-16 NOTE — Progress Notes (Signed)
GYN ONCOLOGY OFFICE VISIT   GYN Physician  Dr. Carlena Bjornstad   Chief Complaint:   Stage IA endometrial cancer.   Assessment/Plan:  Erica Moyer  is a 65 y.o.  year old with grade 2 endometrial cancer. S/P endometrial cancer staging 02/2015.  10% myoinvasion, grade 2 with LVSI.  Post op course complicated by vaginal cuff separation with intact peritoneum. Completed Vaginal bracytherapy 06/11/2015  Follow-up in 10/2015 with Gyn Onc     HPI: Erica Moyer is a 65 y.o. initially seen  at the request of Dr Carlena Bjornstad for grade 2 endometrial cancer.  [name] reports having an episode of vaginal spotting in May 2016. She initially thought this was due to urethral irritation as she has bladder prolapse. She then continued to spot through the summer and fall of 2016 and was evaluated by Dr. Marvel Plan on 01/19/2015 at which time a transvaginal ultrasound scan was performed which revealed a normal size uterus measuring 7.4 x 8.4 x 3.7 cm with a thickened endometrial stripe 15 mm. The ovaries were grossly normal. She then underwent a endometrial biopsy on 01/28/2015 which revealed FIGO grade 2 moderately differentiated endometrioid adenocarcinoma.  On 02/25/2015 she underwent [facility] Right SNL and left PLND  1. Lymph node, sentinel, biopsy, right obturator sentinel - ONE BENIGN LYMPH NODE (0/1). 2. Lymph nodes, regional resection, left pelvic - SIX BENIGN LYMPH NODES (0/6). 3. Uterus +/- tubes/ovaries, neoplastic - ENDOMETRIAL ADENOCARCINOMA, 2.4 CM WITH SUPERFICIAL MYOMETRIAL INVASION. - FOCAL LYMPHATIC VASCULAR INVOLVEMENT BY TUMOR. - CERVIX, BILATERAL FALLOPIAN TUBES AND OVARIES FREE OF TUMOR. Microscopic Comment 3. ONCOLOGY TABLE-UTERUS, CARCINOMA OR CARCINOSARCOMA Specimen: Uterus with bilateral fallopian tubes and ovaries, right obturator sentinel lymph node and left pelvic lymph node. Procedure: Hysterectomy with bilateral salpingo-oophorectomy, right  obturator sentinel lymph node and left pelvic regional resection. Lymph node sampling performed: Yes Specimen integrity: Intact. Maximum tumor size: 2.4 cm Histologic type: Endometrioid with squamous differentiation. Grade: 3 Myometrial invasion: 0.2 cm where myometrium is 2 cm in thickness Cervical stromal involvement: No Extent of involvement of other organs: None detected. Lymph - vascular invasion: Present. Lymph nodes: # examined 7; # positive 0 TNM code: pT1a, pN0 FIGO Stage (based on pathologic findings, needs clinical correlation): [name] reports an episode of vaginal bleeding 19 days ago. Over the last week she has had 2 further episodes. The guys a gush of fluid denies mass in the vagina and no fever or chills.  Seen by Dr. Freddi Che earlier this week for vaginal brachytherapy and he recommended GYN evaluation  Review of Systems:  Constitutional  Feels well,    Gastro Intestinal  No nausea, vomitting, or diarrhoea. No bright red blood per rectum, no abdominal pain, no change in bowel movement, or constipation.  Genito Urinary  No frequency, urgency, dysuria, vaginal bleeding on several occasions over the last 10 days. Musculo Skeletal  No myalgia, arthralgia, joint swelling or pain no changes in sensation or motor function Neurologic  No weakness, numbness, change in gait,  Psychology  No depression, anxiety, insomnia.     Social Hx:   Social History   Social History  . Marital Status: Married    Spouse Name: N/A  . Number of Children: 1  . Years of Education: N/A   Occupational History  . retired Pharmacist, hospital    Social History Main Topics  . Smoking status: Never Smoker   . Smokeless tobacco: Not on file  . Alcohol Use: No  .  Drug Use: No  . Sexual Activity: Not on file   Other Topics Concern  . Not on file   Social History Narrative    Past Surgical Hx:  Past Surgical History  Procedure Laterality Date  . Tubal ligation      1986  .  Cholecystectomy    . Robotic assisted total hysterectomy with bilateral salpingo oopherectomy Bilateral 02/25/2015    Procedure: XI ROBOTIC ASSISTED TOTAL HYSTERECTOMY WITH BILATERAL SALPINGO OOPHORECTOMY WITH SENTINAL LYMPH NODE MAPPING;  Surgeon: [name], MD;  Location: [facility];  Service: Gynecology;  Laterality: Bilateral;    Past Medical Hx:  Past Medical History  Diagnosis Date  . GERD (gastroesophageal reflux disease)   . Complication of anesthesia     slow to wake up  . PONV (postoperative nausea and vomiting)   . Hyperlipidemia   . Headache     hx migraines  . Osteopenia   . Frequency of urination   . Urgency incontinence   . Cancer [facility])     endometrial cancer    Past Gynecological History:  None. SVD x 1 No LMP recorded. Patient is postmenopausal.  Family Hx:  Family History  Problem Relation Age of Onset  . Anesthesia problems Mother   . Hypertension Mother   . Hypertension Father   . Stroke Father   . Cancer Paternal Grandmother     not sure what type    Vitals:  Blood pressure 129/71, pulse 79, temperature 98.1 F (36.7 C), temperature source Oral, resp. rate 18, height 5\' 7"  (1.702 m), weight 187 lb 8 oz (85.049 kg), SpO2 100 %.  Physical Exam: WD in NAD Lungs  Clear to auscultation bilateraly,  Good air movement.  Skin  No rash/lesions/breakdown  Psychiatry  Alert and oriented to person, place, and time,   Abdomen  Normoactive bowel sounds, abdomen soft. No tenderness erythema or masses at site of laparoscopic incisions Back No CVA tenderness Genito Urinary  Vulva/vagina: Normal external female genitalia.  No lesions. No discharge or bleeding. Vaginal cuff intact, no masses  Bladder/urethra: prolapse of mid anterior vagina to introitus  Vagina: normal Extremities  No bilateral cyanosis, clubbing or edema.

## 2015-06-17 ENCOUNTER — Encounter: Payer: Self-pay | Admitting: Gynecologic Oncology

## 2015-06-17 ENCOUNTER — Ambulatory Visit: Payer: PPO | Attending: Gynecologic Oncology | Admitting: Gynecologic Oncology

## 2015-06-17 VITALS — BP 129/71 | HR 79 | Temp 98.1°F | Resp 18 | Ht 67.0 in | Wt 187.5 lb

## 2015-06-17 DIAGNOSIS — R32 Unspecified urinary incontinence: Secondary | ICD-10-CM | POA: Insufficient documentation

## 2015-06-17 DIAGNOSIS — E785 Hyperlipidemia, unspecified: Secondary | ICD-10-CM | POA: Diagnosis not present

## 2015-06-17 DIAGNOSIS — M8580 Other specified disorders of bone density and structure, unspecified site: Secondary | ICD-10-CM | POA: Insufficient documentation

## 2015-06-17 DIAGNOSIS — Z9079 Acquired absence of other genital organ(s): Secondary | ICD-10-CM | POA: Insufficient documentation

## 2015-06-17 DIAGNOSIS — C541 Malignant neoplasm of endometrium: Secondary | ICD-10-CM | POA: Insufficient documentation

## 2015-06-17 DIAGNOSIS — G43909 Migraine, unspecified, not intractable, without status migrainosus: Secondary | ICD-10-CM | POA: Diagnosis not present

## 2015-06-17 DIAGNOSIS — Z90710 Acquired absence of both cervix and uterus: Secondary | ICD-10-CM | POA: Insufficient documentation

## 2015-06-17 DIAGNOSIS — Z90722 Acquired absence of ovaries, bilateral: Secondary | ICD-10-CM | POA: Diagnosis not present

## 2015-06-17 DIAGNOSIS — K219 Gastro-esophageal reflux disease without esophagitis: Secondary | ICD-10-CM | POA: Insufficient documentation

## 2015-06-17 DIAGNOSIS — R350 Frequency of micturition: Secondary | ICD-10-CM | POA: Insufficient documentation

## 2015-06-17 NOTE — Patient Instructions (Signed)
Your follow up appointment with Dr [name] has been scheduled for September 28 ,2017 at 12 noon. Please call with any changes , questions or concerns.  Thank you!!

## 2015-07-07 ENCOUNTER — Telehealth: Payer: Self-pay | Admitting: *Deleted

## 2015-07-07 NOTE — Telephone Encounter (Signed)
On 07-07-15 fax medical records to [employer] it was consult note

## 2015-07-08 ENCOUNTER — Ambulatory Visit
Admission: RE | Admit: 2015-07-08 | Discharge: 2015-07-08 | Disposition: A | Discharge status: Home / Self Care | Payer: PPO | Source: Ambulatory Visit | Attending: Radiation Oncology | Admitting: Radiation Oncology

## 2015-07-08 ENCOUNTER — Encounter: Payer: Self-pay | Admitting: Radiation Oncology

## 2015-07-08 VITALS — BP 118/72 | HR 70 | Temp 97.8°F | Ht 67.0 in | Wt 187.5 lb

## 2015-07-08 DIAGNOSIS — C541 Malignant neoplasm of endometrium: Secondary | ICD-10-CM

## 2015-07-08 NOTE — Progress Notes (Signed)
Radiation Oncology         [phone]) [phone] ________________________________  Name: Erica Moyer MRN: [id number]  Date: 07/08/2015  DOB: [birth year]  Follow-Up Visit Note  CC: [name], MD  [name], MD    ICD-9-CM ICD-10-CM   1. Endometrial cancer (HCC) 182.0 C54.1     Diagnosis:   Stage IA endometrial cancer.   Interval Since Last Radiation:  1  months  Narrative: Erica Moyer here for follow up. She continues to report having twinges of pain in her left lower abdomen. She reports having urinary frequency and urgency. She also reports having urgency with bowel movements but reports she has always had this. She denies having vaginal/rectal bleeding or discharge. She reports having fatigue with activity. Denies nausea.                                ALLERGIES:  has No Known Allergies.  Meds: Current Outpatient Prescriptions  Medication Sig Dispense Refill  . alendronate (FOSAMAX) 35 MG tablet Take 35 mg by mouth every 7 (seven) days. Take with a full glass of water on an empty stomach.    . calcium carbonate (TUMS - DOSED IN MG ELEMENTAL CALCIUM) 500 MG chewable tablet Chew 1 tablet by mouth 3 (three) times daily as needed for indigestion or heartburn. Reported on 05/19/2015    . Cetirizine HCl (ZYRTEC ALLERGY) 10 MG CAPS Take 10 mg by mouth every morning.    Marland Kitchen ibuprofen (ADVIL,MOTRIN) 200 MG tablet Take 200 mg by mouth every 6 (six) hours as needed (pain). Reported on 04/15/2015    . mometasone (NASONEX) 50 MCG/ACT nasal spray INHALE 1 SPRAYS IN EACH NOSTRIL DAILY AS NEEDED for congestion  12  . Multiple Vitamin (MULTIVITAMIN) tablet Take 1 tablet by mouth daily. Reported on 05/19/2015    . rosuvastatin (CRESTOR) 10 MG tablet Take 10 mg by mouth at bedtime.   3  . Calcium Carbonate-Vitamin D (CALTRATE 600+D PO) Take 600 mg by mouth every morning. Reported on 07/08/2015    . conjugated estrogens (PREMARIN) vaginal cream Place 1 Applicatorful vaginally daily. Reported  on 07/08/2015     No current facility-administered medications for this encounter.    Physical Findings: The patient is in no acute distress. Patient is alert and oriented.  height is 5\' 7"  (1.702 m) and weight is 187 lb 8 oz (85.049 kg). Her oral temperature is 97.8 F (36.6 C). Her blood pressure is 118/72 and her pulse is 70. Her oxygen saturation is 100%. .  No significant changes.  No palpable cervical, supraclavicular or axillary lymphoadenopathy. The heart has a regular rate and rhythm. The lungs are clear to auscultation.    Lab Findings: Lab Results  Component Value Date   WBC 8.9 02/26/2015   HGB 12.5 02/26/2015   HCT 39.3 02/26/2015   MCV 87.9 02/26/2015   PLT 187 02/26/2015    Radiographic Findings: No results found.  Impression:  Doing well status post vaginal brachytherapy.  Plan: Dr.Rossi visit in September. RadOnc visit in December. The patient was given a vaginal dilator today.   -----------------------------------  Blair Promise, PhD, MD  This document serves as a record of services personally performed by Gery Pray, MD. It was created on his behalf by Derek Mound, a trained medical scribe. The creation of this record is based on the scribe's personal observations and the provider's statements to them. This document has been checked  and approved by the attending provider.

## 2015-07-08 NOTE — Progress Notes (Signed)
Patient was given a size M Vaginal dilator and surgilube.  Patient was educated in teach back mode to apply surgilube to the dilator and to insert for 10 minutes 3 times a week (Monday, Wednesday, Friday) and to wash with soap and water after use.  Patient verbalized understanding and agreement.

## 2015-07-08 NOTE — Progress Notes (Signed)
Erica Moyer here for follow up.  She continues to report having twinges of pain in her left lower abdomen.  She reports having urinary frequency and urgency.  She also reports having urgency with bowel movements but reports she has always had this.  She denies having vaginal/rectal bleeding or discharge.  She reports having fatigue with activity.  BP 118/72 mmHg  Pulse 70  Temp(Src) 97.8 F (36.6 C) (Oral)  Ht 5\' 7"  (1.702 m)  Wt 187 lb 8 oz (85.049 kg)  BMI 29.36 kg/m2  SpO2 100%   Wt Readings from Last 3 Encounters:  07/08/15 187 lb 8 oz (85.049 kg)  06/17/15 187 lb 8 oz (85.049 kg)  05/19/15 184 lb 12.8 oz (83.825 kg)

## 2015-07-09 NOTE — Addendum Note (Signed)
Encounter addended by: Jacqulyn Liner, RN on: 07/09/2015  7:40 AM<BR>     Documentation filed: Charges VN

## 2015-07-13 NOTE — Addendum Note (Signed)
Encounter addended by: Jacqulyn Liner, RN on: 07/13/2015  9:54 AM<BR>     Documentation filed: Charges VN

## 2015-07-16 NOTE — Addendum Note (Signed)
Encounter addended by: Oneika Simonian R Elouise Divelbiss, RN on: 07/16/2015  1:39 PM<BR>     Documentation filed: Charges VN

## 2015-07-23 DIAGNOSIS — D1801 Hemangioma of skin and subcutaneous tissue: Secondary | ICD-10-CM | POA: Diagnosis not present

## 2015-07-23 DIAGNOSIS — D2239 Melanocytic nevi of other parts of face: Secondary | ICD-10-CM | POA: Diagnosis not present

## 2015-07-23 DIAGNOSIS — D225 Melanocytic nevi of trunk: Secondary | ICD-10-CM | POA: Diagnosis not present

## 2015-07-23 DIAGNOSIS — L821 Other seborrheic keratosis: Secondary | ICD-10-CM | POA: Diagnosis not present

## 2015-07-29 NOTE — Progress Notes (Deleted)
°  Radiation Oncology         (336) [phone] ________________________________  Name: Erica Moyer MRN: [id number]  Date: 06/11/2015  DOB: Jul 03, 1950  End of Treatment Note  Diagnosis:   Stage IA grade 3 endometrial cancer with LVI     Indication for treatment:  Post-Op, risk for vaginal cuff recurrence      Radiation treatment dates:   05/19/15- 06/11/15  Site/dose:   Vaginal Cuff treated to 30 Gy in 5 fractions    Beams/energy:   Iridium HDR / HDR- Vaginal, the patient was treated with a 3 cm diameter segmented cylinder, treatment length was 3 cm, Iridium 192 was the high-dose-rate source, prescription to the mucosal surface  Narrative: The patient tolerated radiation treatment relatively well.     Plan: The patient has completed radiation treatment. The patient will return to radiation oncology clinic for routine followup in one month. I advised them to call or return sooner if they have any questions or concerns related to their recovery or treatment.  -----------------------------------  Blair Promise, PhD, MD  This document serves as a record of services personally performed by Gery Pray, MD. It was created on his behalf by Derek Mound, a trained medical scribe. The creation of this record is based on the scribe's personal observations and the provider's statements to them. This document has been checked and approved by the attending provider.

## 2015-08-12 ENCOUNTER — Ambulatory Visit: Payer: PPO | Admitting: Gynecologic Oncology

## 2015-08-16 DIAGNOSIS — M545 Low back pain: Secondary | ICD-10-CM | POA: Diagnosis not present

## 2015-08-16 DIAGNOSIS — K649 Unspecified hemorrhoids: Secondary | ICD-10-CM | POA: Diagnosis not present

## 2015-08-23 ENCOUNTER — Telehealth: Payer: Self-pay | Admitting: Oncology

## 2015-08-23 ENCOUNTER — Telehealth: Payer: Self-pay | Admitting: *Deleted

## 2015-08-23 NOTE — Telephone Encounter (Signed)
Returned call to pt who c/o seveve abd and lower back pain since Thursday 7/6, i thought it was all the fiber foods i was eating. It was intense so i call Dr. Randa Evens PCP and Dr. Humphrey Rolls and he did xray of back, no fractures, he said he did not see a full blockage but there was something there. He gave me Mateo Flow, I took 1 Monday , then I didn't take one Tuesday, took 1 wed, did not take 1 Thursday, took 1 Friday, and none Saturday/sun because I had to go to church . I have had a liquid stool. Dr. Humphrey Rolls (vcovering for Dr. Randa Evens) said to call oncology if I wasn't any better. I feel better when I'm standing or walking. I have used a heating pad on back, but I cant seem to get rid of the pain. I've been taking ibuprofen. I get relief from the pain med. Dr. Marlyce Huge is gastroenterologist. I recommended pt call Dr. Danella Deis office to discuss if other diagnostic testing should be done or if she needs to see GI doctor. Pt verbalized understanding, asked if I would also give RadOnc Rn this message and if they would reach out to her. No further concerns. Message forward to RadOnc

## 2015-08-23 NOTE — Telephone Encounter (Signed)
Erica Moyer said she has been having cramping pain in her lower back and left side that radiates across her abdomen.  She said the pain started about a month ago and has been worsening in intesity.  She takes Ibuprofen which helps and is using a heating pad.  She said her PCP, Dr. Helene Kelp was out on maternity leave so she saw Dr. Humphrey Rolls on Wednesday last week.  She had an xray of her lower back and pelvis and was told that she may have a bowel blockage.  She said that she had been having small bowel movements and did not ever feel "empty."  She was started on Linzess and has had liquid bowel movements so she has stopped taking it.  She talked to Dr. Helene Kelp today and was told she did not have a bowel blockage but that she could be referred to an orthopedic surgeon or could be seen in her office if needed.  She is wondering if she needs to move up her appointment with Dr. Sondra Come.  She mentioned that she has not been having any vaginal bleeding or trouble urinating.  She did say she is feeling lethargic.  Advised her that Dr. Sondra Come will be notified of her symptoms and that we will call her back.

## 2015-08-23 NOTE — Telephone Encounter (Signed)
Called Guy back and advised her that a scheduler will call her with a follow up appointment for this week.  Tymia verbalized understanding and agreement.

## 2015-08-25 ENCOUNTER — Ambulatory Visit
Admission: RE | Admit: 2015-08-25 | Discharge: 2015-08-25 | Disposition: A | Discharge status: Home / Self Care | Payer: PPO | Source: Ambulatory Visit | Attending: Radiation Oncology | Admitting: Radiation Oncology

## 2015-08-25 ENCOUNTER — Encounter: Payer: Self-pay | Admitting: Oncology

## 2015-08-25 ENCOUNTER — Telehealth: Payer: Self-pay | Admitting: Oncology

## 2015-08-25 VITALS — BP 139/74 | HR 69 | Temp 98.3°F | Ht 67.0 in | Wt 185.8 lb

## 2015-08-25 DIAGNOSIS — C541 Malignant neoplasm of endometrium: Secondary | ICD-10-CM

## 2015-08-25 DIAGNOSIS — R109 Unspecified abdominal pain: Secondary | ICD-10-CM | POA: Diagnosis not present

## 2015-08-25 DIAGNOSIS — M545 Low back pain: Secondary | ICD-10-CM | POA: Insufficient documentation

## 2015-08-25 HISTORY — DX: Reserved for inherently not codable concepts without codable children: IMO0001

## 2015-08-25 HISTORY — DX: Reserved for concepts with insufficient information to code with codable children: IMO0002

## 2015-08-25 LAB — CBC WITH DIFFERENTIAL/PLATELET
BASO%: 0.5 % (ref 0.0–2.0)
Basophils Absolute: 0 10*3/uL (ref 0.0–0.1)
EOS%: 1.3 % (ref 0.0–7.0)
Eosinophils Absolute: 0.1 10*3/uL (ref 0.0–0.5)
HEMATOCRIT: 39.6 % (ref 34.8–46.6)
HGB: 13.1 g/dL (ref 11.6–15.9)
LYMPH#: 1.4 10*3/uL (ref 0.9–3.3)
LYMPH%: 22.2 % (ref 14.0–49.7)
MCH: 28 pg (ref 25.1–34.0)
MCHC: 33 g/dL (ref 31.5–36.0)
MCV: 85 fL (ref 79.5–101.0)
MONO#: 0.5 10*3/uL (ref 0.1–0.9)
MONO%: 8.4 % (ref 0.0–14.0)
NEUT%: 67.6 % (ref 38.4–76.8)
NEUTROS ABS: 4.2 10*3/uL (ref 1.5–6.5)
PLATELETS: 176 10*3/uL (ref 145–400)
RBC: 4.66 10*6/uL (ref 3.70–5.45)
RDW: 13.6 % (ref 11.2–14.5)
WBC: 6.2 10*3/uL (ref 3.9–10.3)

## 2015-08-25 LAB — BASIC METABOLIC PANEL
Anion Gap: 11 mEq/L (ref 3–11)
BUN: 15.1 mg/dL (ref 7.0–26.0)
CO2: 24 meq/L (ref 22–29)
Calcium: 9.6 mg/dL (ref 8.4–10.4)
Chloride: 108 mEq/L (ref 98–109)
Creatinine: 0.8 mg/dL (ref 0.6–1.1)
EGFR: 73 mL/min/{1.73_m2} — ABNORMAL LOW (ref >90)
Glucose: 90 mg/dl (ref 70–140)
Potassium: 3.9 mEq/L (ref 3.5–5.1)
SODIUM: 143 meq/L (ref 136–145)

## 2015-08-25 NOTE — Progress Notes (Signed)
Radiation Oncology         (336) [phone] ________________________________  Name: Erica Moyer MRN: [id number]  Date: 08/25/2015  DOB: Mar 03, 1950  Follow-Up Visit Note  CC: Erica Hipps, MD  [name], MD    ICD-9-CM ICD-10-CM   1. Endometrial cancer (HCC) 182.0 C54.1 CBC with Differential     Basic metabolic panel     CT Abdomen Pelvis W Wo Contrast    Diagnosis:   Stage IA endometrial cancer.   Interval Since Last Radiation:  2.5 months. Completed HDR brachytherapy 05/19/2015, 05/27/2015, 06/01/2015, 06/08/2015, and 4/282017.  Narrative: [name] here for follow up Earlier than her scheduled follow-up in December of this year. She reports having pain in her lower back towards her tailbone that started in mid June. She is rating the pain at a 5/10 and has been taking 2 ibuprofen per day. She reports the pain is worse with laying down and is relieved with walking. She said she had an xray at her primary care doctor in [place] which showed arthritis and no bowel blockage. She also reports that her stomach has been bloated and has tight, sharp abdominal cramping last week. She reports having constipation and had a bowel movement this morning but still feels bloated. She also continues to have pain on and off in her left pelvic/groin area. She has been using a dilator 3 times a week and reports having some pain when emptying her bladder afterwards. She said she had a urine test with her PCP which she was told was normal. She denies having any vaginal/rectal bleeding or discharge. She reports having a poor appetite and low energy level. She mentions that when she had this pain a week ago, she would have rated the pain as a 10/10 and the pain wrapped around her back to the front of the abdomen. At this time, she also has hemorrhoid swelling.   ALLERGIES:  has No Known Allergies.  Meds: Current Outpatient Prescriptions  Medication Sig Dispense Refill  . alendronate (FOSAMAX)  35 MG tablet Take 35 mg by mouth every 7 (seven) days. Take with a full glass of water on an empty stomach.    . calcium carbonate (TUMS - DOSED IN MG ELEMENTAL CALCIUM) 500 MG chewable tablet Chew 1 tablet by mouth 3 (three) times daily as needed for indigestion or heartburn. Reported on 05/19/2015    . Calcium Carbonate-Vitamin D (CALTRATE 600+D PO) Take 600 mg by mouth every morning. Reported on 07/08/2015    . Cetirizine HCl (ZYRTEC ALLERGY) 10 MG CAPS Take 10 mg by mouth every morning.    . hydrocortisone (ANUSOL-HC) 25 MG suppository INSERT 1 (ONE) SUPPOSITORY RECTALLY AT BEDTIME, AS NEEDED FOR HEMORRHOIDS  1  . ibuprofen (ADVIL,MOTRIN) 200 MG tablet Take 200 mg by mouth every 6 (six) hours as needed (pain). Reported on 04/15/2015    . mometasone (NASONEX) 50 MCG/ACT nasal spray INHALE 1 SPRAYS IN EACH NOSTRIL DAILY AS NEEDED for congestion  12  . Multiple Vitamin (MULTIVITAMIN) tablet Take 1 tablet by mouth daily. Reported on 05/19/2015    . PROCTOSOL HC 2.5 % rectal cream APPLY TO RECTUM TWO TIMES DAILY, AS NEEDED FOR HEMORRHOIDS  1  . rosuvastatin (CRESTOR) 10 MG tablet Take 10 mg by mouth at bedtime.   3  . conjugated estrogens (PREMARIN) vaginal cream Place 1 Applicatorful vaginally daily. Reported on 08/25/2015     No current facility-administered medications for this encounter.    Physical Findings: The patient is in  no acute distress. Patient is alert and oriented.  height is 5\' 7"  (1.702 m) and weight is 185 lb 12.8 oz (84.278 kg). Her oral temperature is 98.3 F (36.8 C). Her blood pressure is 139/74 and her pulse is 69. Her oxygen saturation is 100%. .  No significant changes.  No palpable cervical, supraclavicular, or axillary lymphoadenopathy. The heart has a regular rate and rhythm. The lungs are clear to auscultation. The abdomen is soft and nontender with normal bowel sounds in all four quadrant, no rebound or guarding. She mentions she has some soreness in the left lower quadrant  when palpated.  On pelvic examination the external genitalia were unremarkable. A speculum exam was performed. There are no mucosal lesions noted in the vaginal vault. On bimanual and rectovaginal examination there were no pelvic masses appreciated or tenderness. Rectal sphincter tone normal.   Lab Findings: Lab Results  Component Value Date   WBC 6.2 08/25/2015   HGB 13.1 08/25/2015   HCT 39.6 08/25/2015   MCV 85.0 08/25/2015   PLT 176 08/25/2015    Radiographic Findings: No results found.  Impression:  Doing well status post vaginal brachytherapy. No signs of recurrence on clinical exam. The etiology of the patient's back and abdominal pain is unknown. This is significant and intensity Lower abdomen and lower back pain seems to be worsening, blood work and CT scans for furher evaluation.  Plan: She will get blood work done today. She will have a CT scan of the abdomen and pelvis due to her abdominal and back pain in the near future.  She has a follow up appointment with Dr. Skeet Latch 11/11/2015 And radiation oncology in December.  -----------------------------------  Blair Promise, PhD, MD    This document serves as a record of services personally performed by Gery Pray, MD. It was created on his behalf by Lendon Collar, a trained medical scribe. The creation of this record is based on the scribe's personal observations and the provider's statements to them. This document has been checked and approved by the attending provider.

## 2015-08-25 NOTE — Telephone Encounter (Addendum)
Left a message for [facility] regarding her follow up appointment with Dr. Sondra Come today at 1 pm.  Advised her to call if she needs to change the appointment.

## 2015-08-25 NOTE — Progress Notes (Signed)
Erica Moyer here for follow up.  She reports having pain in her lower back towards her tailbone that started in mid June.  She is rating the pain at a 5/10 and has been taking 2 ibuprofen per day.  She reports the pain is worse with laying down and is relieved with walking.  She said she had an xray at her primary care doctor which showed arthritis.  She also reports that her stomach has been bloated and had tight,sharp abdominal cramping last week.  She reports having constipation and had a bowel movement this morning but still feels bloated.  She also continues to have pain on and off in her left pelvic/groin area.  She has been using a dilator 3 times a week and reports having some pain when emptying her bladder afterwards.  She said she had a urine test with her PCP which she was told was normal.  She denies having any vaginal/rectal bleeding or discharge.  She reports having a poor appetite and low energy level.  BP 139/74 mmHg  Pulse 69  Temp(Src) 98.3 F (36.8 C) (Oral)  Ht 5\' 7"  (1.702 m)  Wt 185 lb 12.8 oz (84.278 kg)  BMI 29.09 kg/m2  SpO2 100%   Wt Readings from Last 3 Encounters:  08/25/15 185 lb 12.8 oz (84.278 kg)  07/08/15 187 lb 8 oz (85.049 kg)  06/17/15 187 lb 8 oz (85.049 kg)

## 2015-09-01 NOTE — Progress Notes (Signed)
  Radiation Oncology         [phone]) [phone] ________________________________  Name: Erica Moyer MRN: [id number]  Date: 06/11/2015  DOB: 11/29/1950  End of Treatment Note   Diagnosis: Stage IA grade 3 endometrial cancer with LVI   Indication for treatment: Post-Op, risk for vaginal cuff recurrence   Radiation treatment dates: 05/19/15- 06/11/15   Site/dose: Vaginal Cuff treated to 30 Gy in 5 fractions   Beams/energy: Iridium HDR / HDR- Vaginal, the patient was treated with a 3 cm diameter segmented cylinder, treatment length was 3 cm, Iridium 192 was the high-dose-rate source, prescription to the mucosal surface   Narrative: The patient tolerated radiation treatment relatively well.   Plan: The patient has completed radiation treatment. The patient will return to radiation oncology clinic for routine followup in one month. I advised them to call or return sooner if they have any questions or concerns related to their recovery or treatment.   -----------------------------------  Blair Promise, PhD, MD  This document serves as a record of services personally performed by Gery Pray, MD. It was created on his behalf by Darcus Austin, a trained medical scribe. The creation of this record is based on the scribe's personal observations and the provider's statements to them. This document has been checked and approved by the attending provider.

## 2015-09-02 ENCOUNTER — Telehealth: Payer: Self-pay | Admitting: *Deleted

## 2015-09-02 NOTE — Telephone Encounter (Signed)
CALLED PATIENT TO INFORM OF CT FOR 09-08-15 - ARRIVAL TIME - 12:15 PM , NPO 4 HRS. PRIOR TO TEST @[facility]  RADIOLOGY, SPOKE WITH PATIENT AND SHE IS AWARE OF THIS TEST

## 2015-09-08 ENCOUNTER — Other Ambulatory Visit: Payer: Self-pay | Admitting: Radiation Oncology

## 2015-09-08 ENCOUNTER — Ambulatory Visit (HOSPITAL_COMMUNITY)
Admission: RE | Admit: 2015-09-08 | Discharge: 2015-09-08 | Disposition: A | Discharge status: Home / Self Care | Payer: PPO | Source: Ambulatory Visit | Attending: Radiation Oncology | Admitting: Radiation Oncology

## 2015-09-08 ENCOUNTER — Encounter (HOSPITAL_COMMUNITY): Payer: Self-pay

## 2015-09-08 DIAGNOSIS — C541 Malignant neoplasm of endometrium: Secondary | ICD-10-CM | POA: Insufficient documentation

## 2015-09-08 DIAGNOSIS — C786 Secondary malignant neoplasm of retroperitoneum and peritoneum: Secondary | ICD-10-CM | POA: Insufficient documentation

## 2015-09-08 DIAGNOSIS — Z90710 Acquired absence of both cervix and uterus: Secondary | ICD-10-CM | POA: Diagnosis not present

## 2015-09-08 DIAGNOSIS — Z9079 Acquired absence of other genital organ(s): Secondary | ICD-10-CM | POA: Insufficient documentation

## 2015-09-08 DIAGNOSIS — Z90722 Acquired absence of ovaries, bilateral: Secondary | ICD-10-CM | POA: Diagnosis not present

## 2015-09-08 DIAGNOSIS — N1330 Unspecified hydronephrosis: Secondary | ICD-10-CM | POA: Diagnosis not present

## 2015-09-08 MED ORDER — IOPAMIDOL (ISOVUE-300) INJECTION 61%
100.0000 mL | Freq: Once | INTRAVENOUS | Status: AC | PRN
  Administered 2015-09-08: 100 mL via INTRAVENOUS

## 2015-09-10 ENCOUNTER — Ambulatory Visit: Payer: PPO | Attending: Gynecology | Admitting: Gynecology

## 2015-09-10 ENCOUNTER — Encounter: Payer: Self-pay | Admitting: Gynecology

## 2015-09-10 VITALS — BP 150/80 | HR 68 | Temp 98.3°F | Resp 18 | Ht 67.0 in | Wt 183.4 lb

## 2015-09-10 DIAGNOSIS — N803 Endometriosis of pelvic peritoneum: Secondary | ICD-10-CM | POA: Diagnosis not present

## 2015-09-10 DIAGNOSIS — Z79899 Other long term (current) drug therapy: Secondary | ICD-10-CM | POA: Insufficient documentation

## 2015-09-10 DIAGNOSIS — E785 Hyperlipidemia, unspecified: Secondary | ICD-10-CM | POA: Insufficient documentation

## 2015-09-10 DIAGNOSIS — Z9049 Acquired absence of other specified parts of digestive tract: Secondary | ICD-10-CM | POA: Insufficient documentation

## 2015-09-10 DIAGNOSIS — K219 Gastro-esophageal reflux disease without esophagitis: Secondary | ICD-10-CM | POA: Diagnosis not present

## 2015-09-10 DIAGNOSIS — Z8542 Personal history of malignant neoplasm of other parts of uterus: Secondary | ICD-10-CM | POA: Diagnosis not present

## 2015-09-10 DIAGNOSIS — R102 Pelvic and perineal pain: Secondary | ICD-10-CM | POA: Insufficient documentation

## 2015-09-10 DIAGNOSIS — M545 Low back pain: Secondary | ICD-10-CM | POA: Insufficient documentation

## 2015-09-10 DIAGNOSIS — C541 Malignant neoplasm of endometrium: Secondary | ICD-10-CM | POA: Diagnosis not present

## 2015-09-10 DIAGNOSIS — M8580 Other specified disorders of bone density and structure, unspecified site: Secondary | ICD-10-CM | POA: Insufficient documentation

## 2015-09-10 DIAGNOSIS — R350 Frequency of micturition: Secondary | ICD-10-CM | POA: Diagnosis not present

## 2015-09-10 NOTE — Progress Notes (Signed)
Consult Note: Gyn-Onc   Erica Moyer 65 y.o. female  Chief Complaint  Patient presents with  . endometrial cancer    follow up visit    Assessment :Likely recurrent endometrial carcinoma with pelvic peritoneal implants. Pelvic pain secondary to recurrent disease.  Plan: To be certain of our diagnosis I recommend we obtain a CT-guided biopsy of one of the pelvic lesions. Assuming that this documents recurrent endometrial carcinoma, and given multiple peritoneal implants, I recommend the patient be treated with systemic chemotherapy (carboplatin and Taxol). The patient may wish to have chemotherapy in [place].  Interval History: Patient presents today for review of a recent CT scan. She was seen by Dr. Sofie Hartigan in mid July and at that time complained of increasing pelvic and lower back pain. CT scan was obtained on July 26 demonstrated what appears to be intraperitoneal disease including a left-sided metastatic site measuring 4.6 x 3.4 x 3 cm causing mass effect on the distal third of the left ureter and moderate proximal hydronephrosis and has some mass effect on the sigmoid colon. There were 2 implants in the right hemipelvis measuring 3 x 2.2 and 2.9 x 2.3 cm. There does not seem to be upper abdominal disease.  Today, the patient is complaining of lower abdominal pain which seems to be more prominent on the left but extends across the entire pelvis and along the lower back. She denies any flank pain. She denies any fever or chills. She is taking ibuprofen 400 mg every 4-6 hours as needed for pain.  HPI: Patient underwent a robotic lap scopic hysterectomy bilateral salpingo-oophorectomy and sentinel lymph nodes on 02/25/2015. Final pathology showed a grade 3 endometrial cancer measuring 2.4 cm in diameter with superficial myometrial invasion. There was focal lymphatic vascular involvement. Lymph nodes and adnexa were negative for any metastatic disease. She has a postoperative difficulty  with the cuff separation but ultimately received vaginal vault brachytherapy completed on 06/11/2015. In mid July she developed lower abdominal pain resulting in the above-noted evaluation with a CT scan.  Review of Systems:10 point review of systems is negative except as noted in interval history.   Vitals: Blood pressure (!) 150/80, pulse 68, temperature 98.3 F (36.8 C), resp. rate 18, height 5\' 7"  (1.702 m), weight 183 lb 6.4 oz (83.2 kg), SpO2 100 %.  Physical Exam: General : The patient is a healthy woman in no acute distress.  HEENT: normocephalic, extraoccular movements normal; neck is supple without thyromegally  Lynphnodes: Supraclavicular and inguinal nodes not enlarged  Abdomen: Soft, non-tender, no ascites, no organomegally, no masses, no hernias  Pelvic:  EGBUS: Normal female  Vagina: Normal, no lesions , there is some tenderness and nodularity at the right vaginal apex. No distinct masses identified. Urethra and Bladder: Normal, non-tender  Cervix: Surgically absent  Uterus: Surgically absent  Bi-manual examination: Non-tender; no adenxal masses or nodularity  Rectal: normal sphincter tone, no masses, no blood  Lower extremities: No edema or varicosities. Normal range of motion      No Known Allergies  Past Medical History:  Diagnosis Date  . Cancer [facility])    endometrial cancer  . Complication of anesthesia    slow to wake up  . Frequency of urination   . GERD (gastroesophageal reflux disease)   . Headache    hx migraines  . Hyperlipidemia   . Osteopenia   . PONV (postoperative nausea and vomiting)   . Radiation 05/19/15-06/11/15   vaginal cuff 30 Gy  . Urgency incontinence  Past Surgical History:  Procedure Laterality Date  . CHOLECYSTECTOMY    . ROBOTIC ASSISTED TOTAL HYSTERECTOMY WITH BILATERAL SALPINGO OOPHERECTOMY Bilateral 02/25/2015   Procedure: XI ROBOTIC ASSISTED TOTAL HYSTERECTOMY WITH BILATERAL SALPINGO OOPHORECTOMY WITH SENTINAL LYMPH NODE  MAPPING;  Surgeon: [name], MD;  Location: [facility]S;  Service: Gynecology;  Laterality: Bilateral;  . TUBAL LIGATION     1986    Current Outpatient Prescriptions  Medication Sig Dispense Refill  . Cetirizine HCl (ZYRTEC ALLERGY) 10 MG CAPS Take 10 mg by mouth every morning.    Marland Kitchen ibuprofen (ADVIL,MOTRIN) 200 MG tablet Take 200 mg by mouth every 6 (six) hours as needed (pain). Reported on 04/15/2015    . mometasone (NASONEX) 50 MCG/ACT nasal spray INHALE 1 SPRAYS IN EACH NOSTRIL DAILY AS NEEDED for congestion  12  . rosuvastatin (CRESTOR) 10 MG tablet Take 10 mg by mouth at bedtime.   3  . alendronate (FOSAMAX) 35 MG tablet Take 35 mg by mouth every 7 (seven) days. Take with a full glass of water on an empty stomach.    . calcium carbonate (TUMS - DOSED IN MG ELEMENTAL CALCIUM) 500 MG chewable tablet Chew 1 tablet by mouth 3 (three) times daily as needed for indigestion or heartburn. Reported on 05/19/2015    . Calcium Carbonate-Vitamin D (CALTRATE 600+D PO) Take 600 mg by mouth every morning. Reported on 07/08/2015    . conjugated estrogens (PREMARIN) vaginal cream Place 1 Applicatorful vaginally daily. Reported on 08/25/2015    . hydrocortisone (ANUSOL-HC) 25 MG suppository INSERT 1 (ONE) SUPPOSITORY RECTALLY AT BEDTIME, AS NEEDED FOR HEMORRHOIDS  1  . Multiple Vitamin (MULTIVITAMIN) tablet Take 1 tablet by mouth daily. Reported on 05/19/2015    . PROCTOSOL HC 2.5 % rectal cream APPLY TO RECTUM TWO TIMES DAILY, AS NEEDED FOR HEMORRHOIDS  1   No current facility-administered medications for this visit.     Social History   Social History  . Marital status: Married    Spouse name: N/A  . Number of children: 1  . Years of education: N/A   Occupational History  . retired Pharmacist, hospital    Social History Main Topics  . Smoking status: Never Smoker  . Smokeless tobacco: Never Used  . Alcohol use No  . Drug use: No  . Sexual activity: Not on file   Other Topics Concern  . Not on file    Social History Narrative  . No narrative on file    Family History  Problem Relation Age of Onset  . Anesthesia problems Mother   . Hypertension Mother   . Hypertension Father   . Stroke Father   . Cancer Paternal Grandmother     not sure what type      Marti Sleigh, MD 09/10/2015, 11:23 AM

## 2015-09-10 NOTE — Patient Instructions (Signed)
You will receive a phone call from the hospital to arrange for a biopsy performed at [facility].  We will also work on making an appointment for you to meet with Dr. Hosie Poisson, Medical Oncologist at the [facility].  You can take 3 to 4 ibuprofen tablets (600-800mg  total) every six hours as needed for pain and take with food.  You can also take tramadol 50-100mg  every eight hours as needed if the ibuprofen is not helping with pain control.

## 2015-09-14 ENCOUNTER — Telehealth: Payer: Self-pay | Admitting: Gynecologic Oncology

## 2015-09-14 NOTE — Telephone Encounter (Signed)
Returned call to patient.  Advised her that I spoke with Dr. Sondra Come and he advised she could stop use of dilator since it was so uncomfortable for her.  Also advised her that I spoke with IR and they said she could take a tylenol or tramadol the morning of her biopsy with a small sip of water.  All questions answered.  Advised to call for any needs.

## 2015-09-14 NOTE — Telephone Encounter (Signed)
Returned call to patient.  Patient reporting moderate pain.  States she can tell when the tramadol wears off in 4 hours.  Asking about alternating with Tylenol.  Asking if she can take Tylenol or Tramadol the morning of her biopsy.  Confirmed with Tiffany in IR that she could take either medication with a sip of water.  Asking if she needs to continue use of dilator three times a week since it has become very uncomfortable.  Message left for Dr. Sondra Come on this issue.  Advised patient I would call her back with the answers to the above questions.  Advised to call for any needs or concerns.

## 2015-09-15 ENCOUNTER — Other Ambulatory Visit: Payer: Self-pay | Admitting: Radiology

## 2015-09-16 ENCOUNTER — Encounter (HOSPITAL_COMMUNITY): Payer: Self-pay

## 2015-09-16 ENCOUNTER — Ambulatory Visit (HOSPITAL_COMMUNITY)
Admission: RE | Admit: 2015-09-16 | Discharge: 2015-09-16 | Disposition: A | Discharge status: Home / Self Care | Payer: PPO | Source: Ambulatory Visit | Attending: Gynecologic Oncology | Admitting: Gynecologic Oncology

## 2015-09-16 DIAGNOSIS — R1900 Intra-abdominal and pelvic swelling, mass and lump, unspecified site: Secondary | ICD-10-CM | POA: Insufficient documentation

## 2015-09-16 DIAGNOSIS — C495 Malignant neoplasm of connective and soft tissue of pelvis: Secondary | ICD-10-CM | POA: Diagnosis not present

## 2015-09-16 DIAGNOSIS — N3941 Urge incontinence: Secondary | ICD-10-CM | POA: Diagnosis not present

## 2015-09-16 DIAGNOSIS — Z90710 Acquired absence of both cervix and uterus: Secondary | ICD-10-CM | POA: Insufficient documentation

## 2015-09-16 DIAGNOSIS — Z90722 Acquired absence of ovaries, bilateral: Secondary | ICD-10-CM | POA: Insufficient documentation

## 2015-09-16 DIAGNOSIS — E785 Hyperlipidemia, unspecified: Secondary | ICD-10-CM | POA: Insufficient documentation

## 2015-09-16 DIAGNOSIS — Z9079 Acquired absence of other genital organ(s): Secondary | ICD-10-CM | POA: Insufficient documentation

## 2015-09-16 DIAGNOSIS — Z9049 Acquired absence of other specified parts of digestive tract: Secondary | ICD-10-CM | POA: Insufficient documentation

## 2015-09-16 DIAGNOSIS — Z9221 Personal history of antineoplastic chemotherapy: Secondary | ICD-10-CM | POA: Diagnosis not present

## 2015-09-16 DIAGNOSIS — R1909 Other intra-abdominal and pelvic swelling, mass and lump: Secondary | ICD-10-CM | POA: Diagnosis not present

## 2015-09-16 DIAGNOSIS — K219 Gastro-esophageal reflux disease without esophagitis: Secondary | ICD-10-CM | POA: Insufficient documentation

## 2015-09-16 DIAGNOSIS — C7989 Secondary malignant neoplasm of other specified sites: Secondary | ICD-10-CM | POA: Diagnosis not present

## 2015-09-16 DIAGNOSIS — G43909 Migraine, unspecified, not intractable, without status migrainosus: Secondary | ICD-10-CM | POA: Insufficient documentation

## 2015-09-16 DIAGNOSIS — C541 Malignant neoplasm of endometrium: Secondary | ICD-10-CM | POA: Diagnosis not present

## 2015-09-16 DIAGNOSIS — M8580 Other specified disorders of bone density and structure, unspecified site: Secondary | ICD-10-CM | POA: Diagnosis not present

## 2015-09-16 LAB — CBC
HCT: 40.2 % (ref 36.0–46.0)
Hemoglobin: 13.6 g/dL (ref 12.0–15.0)
MCH: 28.3 pg (ref 26.0–34.0)
MCHC: 33.8 g/dL (ref 30.0–36.0)
MCV: 83.6 fL (ref 78.0–100.0)
PLATELETS: 211 10*3/uL (ref 150–400)
RBC: 4.81 MIL/uL (ref 3.87–5.11)
RDW: 12.7 % (ref 11.5–15.5)
WBC: 8 10*3/uL (ref 4.0–10.5)

## 2015-09-16 LAB — PROTIME-INR
INR: 1.01
Prothrombin Time: 13.3 seconds (ref 11.4–15.2)

## 2015-09-16 LAB — APTT: aPTT: 26 seconds (ref 24–36)

## 2015-09-16 MED ORDER — MIDAZOLAM HCL 2 MG/2ML IJ SOLN
INTRAMUSCULAR | Status: AC
  Filled 2015-09-16: qty 2

## 2015-09-16 MED ORDER — FENTANYL CITRATE (PF) 100 MCG/2ML IJ SOLN
INTRAMUSCULAR | Status: AC
  Filled 2015-09-16: qty 2

## 2015-09-16 MED ORDER — FENTANYL CITRATE (PF) 100 MCG/2ML IJ SOLN
INTRAMUSCULAR | Status: AC | PRN
  Administered 2015-09-16 (×2): 50 ug via INTRAVENOUS

## 2015-09-16 MED ORDER — FLUMAZENIL 0.5 MG/5ML IV SOLN
INTRAVENOUS | Status: AC
  Filled 2015-09-16: qty 5

## 2015-09-16 MED ORDER — NALOXONE HCL 0.4 MG/ML IJ SOLN
INTRAMUSCULAR | Status: AC
  Filled 2015-09-16: qty 1

## 2015-09-16 MED ORDER — SODIUM CHLORIDE 0.9 % IV SOLN
INTRAVENOUS | Status: DC
  Administered 2015-09-16: 09:00:00 via INTRAVENOUS

## 2015-09-16 MED ORDER — MIDAZOLAM HCL 2 MG/2ML IJ SOLN
INTRAMUSCULAR | Status: AC | PRN
  Administered 2015-09-16 (×2): 1 mg via INTRAVENOUS

## 2015-09-16 NOTE — Discharge Instructions (Signed)
Moderate Conscious Sedation, Adult, Care After Refer to this sheet in the next few weeks. These instructions provide you with information on caring for yourself after your procedure. Your health care provider may also give you more specific instructions. Your treatment has been planned according to current medical practices, but problems sometimes occur. Call your health care provider if you have any problems or questions after your procedure. WHAT TO EXPECT AFTER THE PROCEDURE  After your procedure:  You may feel sleepy, clumsy, and have poor balance for several hours.  Vomiting may occur if you eat too soon after the procedure. HOME CARE INSTRUCTIONS  Do not participate in any activities where you could become injured for at least 24 hours. Do not:  Drive.  Swim.  Ride a bicycle.  Operate heavy machinery.  Cook.  Use power tools.  Climb ladders.  Work from a high place.  Do not make important decisions or sign legal documents until you are improved.  If you vomit, drink water, juice, or soup when you can drink without vomiting. Make sure you have little or no nausea before eating solid foods.  Only take over-the-counter or prescription medicines for pain, discomfort, or fever as directed by your health care provider.  Make sure you and your family fully understand everything about the medicines given to you, including what side effects may occur.  You should not drink alcohol, take sleeping pills, or take medicines that cause drowsiness for at least 24 hours.  If you smoke, do not smoke without supervision.  If you are feeling better, you may resume normal activities 24 hours after you were sedated.  Keep all appointments with your health care provider. SEEK MEDICAL CARE IF:  Your skin is pale or bluish in color.  You continue to feel nauseous or vomit.  Your pain is getting worse and is not helped by medicine.  You have bleeding or swelling.  You are still  sleepy or feeling clumsy after 24 hours. SEEK IMMEDIATE MEDICAL CARE IF:  You develop a rash.  You have difficulty breathing.  You develop any type of allergic problem.  You have a fever. MAKE SURE YOU:  Understand these instructions.  Will watch your condition.  Will get help right away if you are not doing well or get worse.   This information is not intended to replace advice given to you by your health care provider. Make sure you discuss any questions you have with your health care provider.   Document Released: 11/20/2012 Document Revised: 02/20/2014 Document Reviewed: 11/20/2012 Elsevier Interactive Patient Education 2016 Elsevier Inc[employer]   Needle Biopsy, Care After Refer to this sheet in the next few weeks. These instructions provide you with information about caring for yourself after your procedure. Your health care provider may also give you more specific instructions. Your treatment has been planned according to current medical practices, but problems sometimes occur. Call your health care provider if you have any problems or questions after your procedure. WHAT TO EXPECT AFTER THE PROCEDURE After your procedure, it is common to have soreness, bruising, or mild pain at the biopsy site. This should go away in a few days. HOME CARE INSTRUCTIONS  Rest as directed by your health care provider.  Take medicines only as directed by your health care provider.  There are many different ways to close and cover the biopsy site, including stitches (sutures), skin glue, and adhesive strips. Follow your health care provider's instructions about:  Biopsy site care.  Bandage (  dressing) changes and removal.  Biopsy site closure removal.  Check your biopsy site every day for signs of infection. Watch for:  Redness, swelling, or pain.  Fluid, blood, or pus. SEEK MEDICAL CARE IF:  You have a fever.  You have redness, swelling, or pain at the biopsy site that lasts longer than  a few days.  You have fluid, blood, or pus coming from the biopsy site.  You feel nauseous.  You vomit. SEEK IMMEDIATE MEDICAL CARE IF:  You have shortness of breath.  You have trouble breathing.  You have chest pain.   You feel dizzy or you faint.  You have bleeding that does not stop with pressure or a bandage.  You cough up blood.  You have pain in your abdomen.   This information is not intended to replace advice given to you by your health care provider. Make sure you discuss any questions you have with your health care provider.   Document Released: 06/16/2014 Document Reviewed: 06/16/2014 Elsevier Interactive Patient Education [employer].

## 2015-09-16 NOTE — Consult Note (Signed)
Chief Complaint: Patient was seen in consultation today for CT-guided pelvic mass biopsy  Referring Physician(s): Cross,Melissa D  Supervising Physician: Jacqulynn Cadet  Patient Status: Outpatient  History of Present Illness: Erica Moyer is a 65 y.o. female with history of endometrial cancer diagnosed in December 2016, status post TAH/BSO in January 2017. She completed chemotherapy in April 2017. Follow up CT abdomen/ pelvis on 09/08/15 revealed intraperitoneal metastatic disease with  implants noted in the low pelvis and partially obstructing the distal third of left ureter with additional mass effect upon the adjacent mid sigmoid colon as well as another lesion potentially involving the apex of the vagina on the right side. She presents today for CT-guided pelvic mass biopsy for further evaluation.  Past Medical History:  Diagnosis Date  . Cancer [facility])    endometrial cancer  . Complication of anesthesia    slow to wake up  . Frequency of urination   . GERD (gastroesophageal reflux disease)   . Headache    hx migraines  . Hyperlipidemia   . Osteopenia   . PONV (postoperative nausea and vomiting)   . Radiation 05/19/15-06/11/15   vaginal cuff 30 Gy  . Urgency incontinence     Past Surgical History:  Procedure Laterality Date  . CHOLECYSTECTOMY    . ROBOTIC ASSISTED TOTAL HYSTERECTOMY WITH BILATERAL SALPINGO OOPHERECTOMY Bilateral 02/25/2015   Procedure: XI ROBOTIC ASSISTED TOTAL HYSTERECTOMY WITH BILATERAL SALPINGO OOPHORECTOMY WITH SENTINAL LYMPH NODE MAPPING;  Surgeon: [name], MD;  Location: [facility];  Service: Gynecology;  Laterality: Bilateral;  . TUBAL LIGATION     1986    Allergies: Review of patient's allergies indicates no known allergies.  Medications: Prior to Admission medications   Medication Sig Start Date End Date Taking? Authorizing Provider  calcium carbonate (TUMS - DOSED IN MG ELEMENTAL CALCIUM) 500 MG chewable tablet Chew 1 tablet  by mouth 3 (three) times daily as needed for indigestion or heartburn. Reported on 05/19/2015   Yes Historical Provider, MD  Cetirizine HCl (ZYRTEC ALLERGY) 10 MG CAPS Take 10 mg by mouth every morning.   Yes Historical Provider, MD  mometasone (NASONEX) 50 MCG/ACT nasal spray INHALE 1 SPRAYS IN EACH NOSTRIL DAILY AS NEEDED for congestion 12/31/14  Yes Historical Provider, MD  polyethylene glycol (MIRALAX / GLYCOLAX) packet Take 17 g by mouth as needed.   Yes Historical Provider, MD  PROCTOSOL HC 2.5 % rectal cream APPLY TO RECTUM TWO TIMES DAILY, AS NEEDED FOR HEMORRHOIDS 08/16/15  Yes Historical Provider, MD  rosuvastatin (CRESTOR) 10 MG tablet Take 10 mg by mouth at bedtime.  01/23/15  Yes Historical Provider, MD  traMADol (ULTRAM) 50 MG tablet Take by mouth every 6 (six) hours as needed.   Yes Historical Provider, MD  alendronate (FOSAMAX) 35 MG tablet Take 35 mg by mouth every 7 (seven) days. Take with a full glass of water on an empty stomach.    Historical Provider, MD  Calcium Carbonate-Vitamin D (CALTRATE 600+D PO) Take 600 mg by mouth every morning. Reported on 07/08/2015    Historical Provider, MD  conjugated estrogens (PREMARIN) vaginal cream Place 1 Applicatorful vaginally daily. Reported on 08/25/2015    Historical Provider, MD  hydrocortisone (ANUSOL-HC) 25 MG suppository INSERT 1 (ONE) SUPPOSITORY RECTALLY AT BEDTIME, AS NEEDED FOR HEMORRHOIDS 08/16/15   Historical Provider, MD  ibuprofen (ADVIL,MOTRIN) 200 MG tablet Take 200 mg by mouth every 6 (six) hours as needed (pain). Reported on 04/15/2015    Historical Provider, MD  Multiple Vitamin (MULTIVITAMIN) tablet Take 1 tablet by mouth daily. Reported on 05/19/2015    Historical Provider, MD     Family History  Problem Relation Age of Onset  . Anesthesia problems Mother   . Hypertension Mother   . Hypertension Father   . Stroke Father   . Cancer Paternal Grandmother     not sure what type    Social History   Social History  . Marital  status: Married    Spouse name: N/A  . Number of children: 1  . Years of education: N/A   Occupational History  . retired Pharmacist, hospital    Social History Main Topics  . Smoking status: Never Smoker  . Smokeless tobacco: Never Used  . Alcohol use No  . Drug use: No  . Sexual activity: Not Asked   Other Topics Concern  . None   Social History Narrative  . None      Review of Systems patient currently denies fever, headache, chest pain, dyspnea, cough, nausea, vomiting or abnormal bleeding. She does have pelvic and back discomfort, occasional bloating and constipation.  Vital Signs: BP (!) 171/83 (BP Location: Left Arm) Comment: Rn notified  Pulse 72   Temp 97.7 F (36.5 C) (Oral)   Resp 18   Ht 5\' 7"  (1.702 m)   Wt 179 lb 3.2 oz (81.3 kg)   SpO2 100%   BMI 28.07 kg/m   Physical Exam patient awake, alert. Chest clear to auscultation bilaterally. Heart with regular rate and rhythm. Abdomen soft, positive bowel sounds, mild anterior lower pelvic tenderness to palpation; lower extremities with no edema.  Mallampati Score:     Imaging: Ct Abdomen Pelvis W Contrast  Result Date: 09/08/2015 CLINICAL DATA:  65 year old female with history of endometrial carcinoma diagnosed in December 2016, status post TAH/BSO. Chemotherapy completed in April 2017. Lower abdominal pain for 1 month. Constipation and bloating. EXAM: CT ABDOMEN AND PELVIS WITH CONTRAST TECHNIQUE: Multidetector CT imaging of the abdomen and pelvis was performed using the standard protocol following bolus administration of intravenous contrast. CONTRAST:  123mL ISOVUE-300 IOPAMIDOL (ISOVUE-300) INJECTION 61% COMPARISON:  No priors. FINDINGS: Lower chest: 4 mm pulmonary nodule in the left lower lobe posteriorly (image 17 of series 4). Hepatobiliary: No suspicious cystic or solid hepatic lesions. Status post cholecystectomy. Minimal intrahepatic biliary ductal dilatation, and the common bile duct measures 10 mm in the porta  hepatis; imaging findings favored to reflect benign post cholecystectomy physiology. Pancreas: No pancreatic mass. No pancreatic ductal dilatation. No pancreatic or peripancreatic fluid or inflammatory changes. Spleen: Unremarkable. Adrenals/Urinary Tract: Multiple sub cm low-attenuation lesions in the right kidney are too small to definitively characterize, but favored to represent tiny cysts. No focal lesions in the left kidney. However, there is moderate left-sided hydroureteronephrosis, which appears related to an obstructing soft tissue lesion exerting mass effect upon the distal third of the left ureter (see below). Urinary bladder is normal in appearance. Stomach/Bowel: Normal appearance of the stomach. No pathologic dilatation of small bowel or colon. Normal appendix. Peritoneal implant in the low left hemipelvis exerts mass effect upon the mid sigmoid colon, narrowing the lumen, without evidence of obstruction. This appears very intimately associated with the wall of the sigmoid colon, best appreciated on sagittal image 100 of series 6, such that early colonic invasion from this peritoneal implant is not excluded. Vascular/Lymphatic: Minimal atherosclerosis in the pelvic vasculature, without evidence of aneurysm or dissection. No definite lymphadenopathy noted in the abdomen or pelvis. Reproductive: Status post  total abdominal hysterectomy and bilateral salpingo-oophorectomy. Other: In the pelvis bilaterally there are enhancing soft tissue nodules, highly concerning for intraperitoneal metastatic lesions. The largest of these is located on the left side measuring 4.6 x 3.4 x 3.0 cm (axial image 73 of series 2 and coronal image 76 of series 5), where it exerts mass effect upon the distal third of the left ureter causing moderate proximal hydroureteronephrosis, and exerts mass effect upon the mid sigmoid colon with evidence of potential early direct colonic invasion. Two smaller implants are also noted in the  right hemipelvis on images 75 and 77 of series 2 measuring 3.0 x 2.2 cm and 2.9 x 2.3 cm respectively. The distal third of the right ureter comes in close proximity to these lesions, but passes without obstruction. The more posterior of the lesions (image 77 of series 2) is intimately associated with the vaginal apex on the right side. Some degree of direct vaginal invasion is not excluded (coronal image 87 of series 5). No significant volume of ascites. No pneumoperitoneum. Musculoskeletal: There are no aggressive appearing lytic or blastic lesions noted in the visualized portions of the skeleton. IMPRESSION: 1. Status post total abdominal hysterectomy and bilateral salpingo-oophorectomy, with evidence of intraperitoneal metastatic disease in the low anatomic pelvis, as detailed above. The largest of the 3 implants noted in the low anatomic pelvis partially obstructs the distal third of the left ureter resulting in moderate proximal left hydroureteronephrosis, and also exerts significant mass effect upon the adjacent mid sigmoid colon with evidence of potential early direct colonic invasion. One of the lesions in the right hemipelvis may involve the apex of the vagina on the right side. 2. Additional incidental findings, as above. These results will be called to the ordering clinician or representative by the Radiologist Assistant, and communication documented in the PACS or zVision Dashboard. Electronically Signed   By: Vinnie Langton M.D.   On: 09/08/2015 16:34   Labs:  CBC:  Recent Labs  02/23/15 1000 02/26/15 0548 08/25/15 1402  WBC 5.4 8.9 6.2  HGB 13.7 12.5 13.1  HCT 42.0 39.3 39.6  PLT 194 187 176    COAGS: No results for input(s): INR, APTT in the last 8760 hours.  BMP:  Recent Labs  02/23/15 1000 02/26/15 0548 08/25/15 1402  NA 144 144 143  K 4.6 4.2 3.9  CL 110 110  --   CO2 27 24 24   GLUCOSE 103* 103* 90  BUN 13 10 15.1  CALCIUM 9.8 9.1 9.6  CREATININE 0.69 0.78 0.8    GFRNONAA >60 >60  --   GFRAA >60 >60  --     LIVER FUNCTION TESTS:  Recent Labs  02/23/15 1000  BILITOT 0.9  AST 22  ALT 23  ALKPHOS 54  PROT 7.6  ALBUMIN 4.4    TUMOR MARKERS: No results for input(s): AFPTM, CEA, CA199, CHROMGRNA in the last 8760 hours.  Assessment and Plan: 65 y.o. female with history of endometrial cancer diagnosed in December 2016, status post TAH/BSO in January 2017. She completed chemotherapy in April 2017. Follow up CT abdomen/ pelvis on 09/08/15 revealed intraperitoneal metastatic disease with  implants noted in the low pelvis and partially obstructing the distal third of left ureter with additional mass effect upon the adjacent mid sigmoid colon as well as another lesion potentially involving the apex of the vagina on the right side. She presents today for CT-guided pelvic mass biopsy for further evaluation.Risks and benefits discussed with the patient/family including, but  not limited to bleeding, infection, damage to adjacent structures or low yield requiring additional tests.All of the patient's questions were answered, patient is agreeable to proceed.Consent signed and in chart.Labs pending.      Thank you for this interesting consult.  I greatly enjoyed meeting [employer] and look forward to participating in their care.  A copy of this report was sent to the requesting provider on this date.  Electronically Signed: D. Rowe Robert 09/16/2015, 9:34 AM   I spent a total of 20 minutes in face to face in clinical consultation, greater than 50% of which was counseling/coordinating care for CT guided pelvic mass biopsy

## 2015-09-16 NOTE — Procedures (Signed)
Interventional Radiology Procedure Note  Procedure: CT guided biopsy of right pelvic soft tissue mass  Complications: None  Estimated Blood Loss: 0  Recommendations: - Bedrest x 2 hrs - DC home  Signed,  Criselda Peaches, MD

## 2015-09-17 ENCOUNTER — Telehealth: Payer: Self-pay | Admitting: Gynecologic Oncology

## 2015-09-17 NOTE — Telephone Encounter (Signed)
Patient advised of biopsy results.  Advised she would be hearing from [facility].  Referral faxed on July 31.  Patient stating she has enough tramadol left for moderate pain.  Advised to call for any concerns or questions.

## 2015-09-20 ENCOUNTER — Encounter: Payer: Self-pay | Admitting: Gynecologic Oncology

## 2015-09-20 NOTE — Progress Notes (Signed)
Received phone call from Bradly Bienenstock, new referrals coordinator for [facility].  Patient has a new patient appt with Dr. Hinton Rao this Thursday, August 10 at 3pm.

## 2015-09-22 ENCOUNTER — Telehealth: Payer: Self-pay | Admitting: *Deleted

## 2015-09-22 NOTE — Telephone Encounter (Signed)
CALLED PATIENT TO INFORM OF CT FOR 09-29-15 - ARRIVAL TIME - 11:15 AM , NPO 4 HRS. PRIOR TO TEST @ [facility], SPOKE WITH PATIENT AND SHE IS AWARE OF THIS TEST

## 2015-09-23 ENCOUNTER — Telehealth: Payer: Self-pay | Admitting: *Deleted

## 2015-09-23 DIAGNOSIS — R978 Other abnormal tumor markers: Secondary | ICD-10-CM | POA: Diagnosis not present

## 2015-09-23 DIAGNOSIS — Z7901 Long term (current) use of anticoagulants: Secondary | ICD-10-CM | POA: Diagnosis not present

## 2015-09-23 DIAGNOSIS — C786 Secondary malignant neoplasm of retroperitoneum and peritoneum: Secondary | ICD-10-CM | POA: Diagnosis not present

## 2015-09-23 DIAGNOSIS — C541 Malignant neoplasm of endometrium: Secondary | ICD-10-CM | POA: Diagnosis not present

## 2015-09-23 DIAGNOSIS — C55 Malignant neoplasm of uterus, part unspecified: Secondary | ICD-10-CM | POA: Diagnosis not present

## 2015-09-23 NOTE — Telephone Encounter (Signed)
Called patient to inform that  she doesn't need this CT, cancelled test per Dr. Sondra Come request, patient aware of this cancellation and she is good with this.

## 2015-09-24 DIAGNOSIS — Z5111 Encounter for antineoplastic chemotherapy: Secondary | ICD-10-CM | POA: Diagnosis not present

## 2015-09-24 DIAGNOSIS — C55 Malignant neoplasm of uterus, part unspecified: Secondary | ICD-10-CM | POA: Diagnosis not present

## 2015-09-24 DIAGNOSIS — C786 Secondary malignant neoplasm of retroperitoneum and peritoneum: Secondary | ICD-10-CM | POA: Diagnosis not present

## 2015-09-27 DIAGNOSIS — C55 Malignant neoplasm of uterus, part unspecified: Secondary | ICD-10-CM | POA: Diagnosis not present

## 2015-09-27 DIAGNOSIS — C786 Secondary malignant neoplasm of retroperitoneum and peritoneum: Secondary | ICD-10-CM | POA: Diagnosis not present

## 2015-09-29 ENCOUNTER — Ambulatory Visit (HOSPITAL_COMMUNITY): Payer: PPO

## 2015-09-29 DIAGNOSIS — C786 Secondary malignant neoplasm of retroperitoneum and peritoneum: Secondary | ICD-10-CM | POA: Diagnosis not present

## 2015-09-29 DIAGNOSIS — C55 Malignant neoplasm of uterus, part unspecified: Secondary | ICD-10-CM | POA: Diagnosis not present

## 2015-09-29 DIAGNOSIS — Z5111 Encounter for antineoplastic chemotherapy: Secondary | ICD-10-CM | POA: Diagnosis not present

## 2015-10-08 DIAGNOSIS — C541 Malignant neoplasm of endometrium: Secondary | ICD-10-CM | POA: Diagnosis not present

## 2015-10-11 DIAGNOSIS — C786 Secondary malignant neoplasm of retroperitoneum and peritoneum: Secondary | ICD-10-CM | POA: Insufficient documentation

## 2015-10-22 DIAGNOSIS — C786 Secondary malignant neoplasm of retroperitoneum and peritoneum: Secondary | ICD-10-CM | POA: Diagnosis not present

## 2015-10-22 DIAGNOSIS — R300 Dysuria: Secondary | ICD-10-CM | POA: Diagnosis not present

## 2015-10-22 DIAGNOSIS — Z7689 Persons encountering health services in other specified circumstances: Secondary | ICD-10-CM | POA: Diagnosis not present

## 2015-10-22 DIAGNOSIS — C55 Malignant neoplasm of uterus, part unspecified: Secondary | ICD-10-CM | POA: Diagnosis not present

## 2015-11-11 ENCOUNTER — Ambulatory Visit: Payer: PPO | Admitting: Gynecologic Oncology

## 2015-11-12 DIAGNOSIS — Z5189 Encounter for other specified aftercare: Secondary | ICD-10-CM | POA: Diagnosis not present

## 2015-11-12 DIAGNOSIS — C541 Malignant neoplasm of endometrium: Secondary | ICD-10-CM | POA: Diagnosis not present

## 2015-12-02 DIAGNOSIS — N1330 Unspecified hydronephrosis: Secondary | ICD-10-CM | POA: Diagnosis not present

## 2015-12-02 DIAGNOSIS — C786 Secondary malignant neoplasm of retroperitoneum and peritoneum: Secondary | ICD-10-CM | POA: Diagnosis not present

## 2015-12-02 DIAGNOSIS — C55 Malignant neoplasm of uterus, part unspecified: Secondary | ICD-10-CM | POA: Diagnosis not present

## 2015-12-03 ENCOUNTER — Other Ambulatory Visit: Payer: Self-pay | Admitting: Oncology

## 2015-12-03 DIAGNOSIS — Z5111 Encounter for antineoplastic chemotherapy: Secondary | ICD-10-CM | POA: Diagnosis not present

## 2015-12-03 DIAGNOSIS — C786 Secondary malignant neoplasm of retroperitoneum and peritoneum: Secondary | ICD-10-CM | POA: Diagnosis not present

## 2015-12-03 DIAGNOSIS — C55 Malignant neoplasm of uterus, part unspecified: Secondary | ICD-10-CM | POA: Diagnosis not present

## 2015-12-03 DIAGNOSIS — Z1231 Encounter for screening mammogram for malignant neoplasm of breast: Secondary | ICD-10-CM

## 2015-12-10 DIAGNOSIS — C541 Malignant neoplasm of endometrium: Secondary | ICD-10-CM | POA: Diagnosis not present

## 2015-12-13 DIAGNOSIS — C786 Secondary malignant neoplasm of retroperitoneum and peritoneum: Secondary | ICD-10-CM | POA: Diagnosis not present

## 2015-12-13 DIAGNOSIS — C55 Malignant neoplasm of uterus, part unspecified: Secondary | ICD-10-CM | POA: Diagnosis not present

## 2015-12-15 DIAGNOSIS — C786 Secondary malignant neoplasm of retroperitoneum and peritoneum: Secondary | ICD-10-CM | POA: Diagnosis not present

## 2015-12-15 DIAGNOSIS — C55 Malignant neoplasm of uterus, part unspecified: Secondary | ICD-10-CM | POA: Diagnosis not present

## 2015-12-17 DIAGNOSIS — C541 Malignant neoplasm of endometrium: Secondary | ICD-10-CM | POA: Diagnosis not present

## 2015-12-20 DIAGNOSIS — Z23 Encounter for immunization: Secondary | ICD-10-CM | POA: Diagnosis not present

## 2015-12-20 DIAGNOSIS — C55 Malignant neoplasm of uterus, part unspecified: Secondary | ICD-10-CM | POA: Diagnosis not present

## 2015-12-20 DIAGNOSIS — Z5111 Encounter for antineoplastic chemotherapy: Secondary | ICD-10-CM | POA: Diagnosis not present

## 2015-12-20 DIAGNOSIS — C786 Secondary malignant neoplasm of retroperitoneum and peritoneum: Secondary | ICD-10-CM | POA: Diagnosis not present

## 2015-12-22 ENCOUNTER — Ambulatory Visit: Payer: PPO

## 2015-12-22 DIAGNOSIS — Z1231 Encounter for screening mammogram for malignant neoplasm of breast: Secondary | ICD-10-CM | POA: Diagnosis not present

## 2015-12-23 DIAGNOSIS — C786 Secondary malignant neoplasm of retroperitoneum and peritoneum: Secondary | ICD-10-CM | POA: Diagnosis not present

## 2015-12-23 DIAGNOSIS — C55 Malignant neoplasm of uterus, part unspecified: Secondary | ICD-10-CM | POA: Diagnosis not present

## 2015-12-24 DIAGNOSIS — C786 Secondary malignant neoplasm of retroperitoneum and peritoneum: Secondary | ICD-10-CM | POA: Diagnosis not present

## 2015-12-24 DIAGNOSIS — C55 Malignant neoplasm of uterus, part unspecified: Secondary | ICD-10-CM | POA: Diagnosis not present

## 2015-12-24 DIAGNOSIS — Z5111 Encounter for antineoplastic chemotherapy: Secondary | ICD-10-CM | POA: Diagnosis not present

## 2015-12-27 DIAGNOSIS — Z5111 Encounter for antineoplastic chemotherapy: Secondary | ICD-10-CM | POA: Diagnosis not present

## 2015-12-27 DIAGNOSIS — C55 Malignant neoplasm of uterus, part unspecified: Secondary | ICD-10-CM | POA: Diagnosis not present

## 2015-12-27 DIAGNOSIS — C786 Secondary malignant neoplasm of retroperitoneum and peritoneum: Secondary | ICD-10-CM | POA: Diagnosis not present

## 2015-12-29 DIAGNOSIS — C55 Malignant neoplasm of uterus, part unspecified: Secondary | ICD-10-CM | POA: Diagnosis not present

## 2015-12-29 DIAGNOSIS — C786 Secondary malignant neoplasm of retroperitoneum and peritoneum: Secondary | ICD-10-CM | POA: Diagnosis not present

## 2015-12-31 DIAGNOSIS — C541 Malignant neoplasm of endometrium: Secondary | ICD-10-CM | POA: Diagnosis not present

## 2015-12-31 DIAGNOSIS — C787 Secondary malignant neoplasm of liver and intrahepatic bile duct: Secondary | ICD-10-CM | POA: Diagnosis not present

## 2016-01-03 DIAGNOSIS — C786 Secondary malignant neoplasm of retroperitoneum and peritoneum: Secondary | ICD-10-CM | POA: Diagnosis not present

## 2016-01-03 DIAGNOSIS — C55 Malignant neoplasm of uterus, part unspecified: Secondary | ICD-10-CM | POA: Diagnosis not present

## 2016-01-05 DIAGNOSIS — C786 Secondary malignant neoplasm of retroperitoneum and peritoneum: Secondary | ICD-10-CM | POA: Diagnosis not present

## 2016-01-05 DIAGNOSIS — C541 Malignant neoplasm of endometrium: Secondary | ICD-10-CM | POA: Diagnosis not present

## 2016-01-07 DIAGNOSIS — C541 Malignant neoplasm of endometrium: Secondary | ICD-10-CM

## 2016-01-07 DIAGNOSIS — C786 Secondary malignant neoplasm of retroperitoneum and peritoneum: Secondary | ICD-10-CM | POA: Diagnosis not present

## 2016-01-10 DIAGNOSIS — C55 Malignant neoplasm of uterus, part unspecified: Secondary | ICD-10-CM | POA: Diagnosis not present

## 2016-01-10 DIAGNOSIS — C786 Secondary malignant neoplasm of retroperitoneum and peritoneum: Secondary | ICD-10-CM | POA: Diagnosis not present

## 2016-01-12 DIAGNOSIS — C541 Malignant neoplasm of endometrium: Secondary | ICD-10-CM | POA: Diagnosis not present

## 2016-01-12 DIAGNOSIS — C786 Secondary malignant neoplasm of retroperitoneum and peritoneum: Secondary | ICD-10-CM | POA: Diagnosis not present

## 2016-01-12 DIAGNOSIS — Z515 Encounter for palliative care: Secondary | ICD-10-CM | POA: Diagnosis not present

## 2016-01-14 DIAGNOSIS — C786 Secondary malignant neoplasm of retroperitoneum and peritoneum: Secondary | ICD-10-CM | POA: Diagnosis not present

## 2016-01-14 DIAGNOSIS — Z5111 Encounter for antineoplastic chemotherapy: Secondary | ICD-10-CM | POA: Diagnosis not present

## 2016-01-14 DIAGNOSIS — F419 Anxiety disorder, unspecified: Secondary | ICD-10-CM | POA: Diagnosis not present

## 2016-01-14 DIAGNOSIS — Z515 Encounter for palliative care: Secondary | ICD-10-CM | POA: Diagnosis not present

## 2016-01-14 DIAGNOSIS — C541 Malignant neoplasm of endometrium: Secondary | ICD-10-CM | POA: Diagnosis not present

## 2016-01-17 DIAGNOSIS — C541 Malignant neoplasm of endometrium: Secondary | ICD-10-CM | POA: Diagnosis not present

## 2016-01-17 DIAGNOSIS — C786 Secondary malignant neoplasm of retroperitoneum and peritoneum: Secondary | ICD-10-CM | POA: Diagnosis not present

## 2016-01-19 DIAGNOSIS — C787 Secondary malignant neoplasm of liver and intrahepatic bile duct: Secondary | ICD-10-CM | POA: Diagnosis not present

## 2016-01-19 DIAGNOSIS — Z515 Encounter for palliative care: Secondary | ICD-10-CM | POA: Diagnosis not present

## 2016-01-19 DIAGNOSIS — C541 Malignant neoplasm of endometrium: Secondary | ICD-10-CM | POA: Diagnosis not present

## 2016-01-21 DIAGNOSIS — C541 Malignant neoplasm of endometrium: Secondary | ICD-10-CM | POA: Diagnosis not present

## 2016-01-21 DIAGNOSIS — R300 Dysuria: Secondary | ICD-10-CM | POA: Diagnosis not present

## 2016-01-21 DIAGNOSIS — C786 Secondary malignant neoplasm of retroperitoneum and peritoneum: Secondary | ICD-10-CM | POA: Diagnosis not present

## 2016-01-24 DIAGNOSIS — C541 Malignant neoplasm of endometrium: Secondary | ICD-10-CM | POA: Diagnosis not present

## 2016-01-24 DIAGNOSIS — C786 Secondary malignant neoplasm of retroperitoneum and peritoneum: Secondary | ICD-10-CM | POA: Diagnosis not present

## 2016-01-26 DIAGNOSIS — C786 Secondary malignant neoplasm of retroperitoneum and peritoneum: Secondary | ICD-10-CM | POA: Diagnosis not present

## 2016-01-26 DIAGNOSIS — C541 Malignant neoplasm of endometrium: Secondary | ICD-10-CM | POA: Diagnosis not present

## 2016-01-27 ENCOUNTER — Ambulatory Visit: Payer: PPO | Admitting: Radiation Oncology

## 2016-01-28 DIAGNOSIS — C7951 Secondary malignant neoplasm of bone: Secondary | ICD-10-CM | POA: Diagnosis not present

## 2016-01-28 DIAGNOSIS — C541 Malignant neoplasm of endometrium: Secondary | ICD-10-CM | POA: Diagnosis not present

## 2016-01-31 DIAGNOSIS — C541 Malignant neoplasm of endometrium: Secondary | ICD-10-CM | POA: Diagnosis not present

## 2016-01-31 DIAGNOSIS — C786 Secondary malignant neoplasm of retroperitoneum and peritoneum: Secondary | ICD-10-CM | POA: Diagnosis not present

## 2016-02-02 DIAGNOSIS — C786 Secondary malignant neoplasm of retroperitoneum and peritoneum: Secondary | ICD-10-CM | POA: Diagnosis not present

## 2016-02-02 DIAGNOSIS — C541 Malignant neoplasm of endometrium: Secondary | ICD-10-CM | POA: Diagnosis not present

## 2016-02-04 DIAGNOSIS — C786 Secondary malignant neoplasm of retroperitoneum and peritoneum: Secondary | ICD-10-CM | POA: Diagnosis not present

## 2016-02-04 DIAGNOSIS — E860 Dehydration: Secondary | ICD-10-CM | POA: Diagnosis not present

## 2016-02-04 DIAGNOSIS — C541 Malignant neoplasm of endometrium: Secondary | ICD-10-CM | POA: Diagnosis not present

## 2016-02-08 DIAGNOSIS — C786 Secondary malignant neoplasm of retroperitoneum and peritoneum: Secondary | ICD-10-CM | POA: Diagnosis not present

## 2016-02-08 DIAGNOSIS — C541 Malignant neoplasm of endometrium: Secondary | ICD-10-CM | POA: Diagnosis not present

## 2016-02-10 DIAGNOSIS — C55 Malignant neoplasm of uterus, part unspecified: Secondary | ICD-10-CM | POA: Diagnosis not present

## 2016-02-10 DIAGNOSIS — C541 Malignant neoplasm of endometrium: Secondary | ICD-10-CM | POA: Diagnosis not present

## 2016-02-11 DIAGNOSIS — D649 Anemia, unspecified: Secondary | ICD-10-CM

## 2016-02-11 DIAGNOSIS — C541 Malignant neoplasm of endometrium: Secondary | ICD-10-CM | POA: Diagnosis not present

## 2016-02-11 DIAGNOSIS — D72819 Decreased white blood cell count, unspecified: Secondary | ICD-10-CM | POA: Diagnosis not present

## 2016-02-11 DIAGNOSIS — C786 Secondary malignant neoplasm of retroperitoneum and peritoneum: Secondary | ICD-10-CM | POA: Diagnosis not present

## 2016-02-15 DIAGNOSIS — C786 Secondary malignant neoplasm of retroperitoneum and peritoneum: Secondary | ICD-10-CM | POA: Diagnosis not present

## 2016-02-15 DIAGNOSIS — C541 Malignant neoplasm of endometrium: Secondary | ICD-10-CM | POA: Diagnosis not present

## 2016-02-18 DIAGNOSIS — C541 Malignant neoplasm of endometrium: Secondary | ICD-10-CM | POA: Diagnosis not present

## 2016-02-18 DIAGNOSIS — C786 Secondary malignant neoplasm of retroperitoneum and peritoneum: Secondary | ICD-10-CM | POA: Diagnosis not present

## 2016-02-22 DIAGNOSIS — C786 Secondary malignant neoplasm of retroperitoneum and peritoneum: Secondary | ICD-10-CM | POA: Diagnosis not present

## 2016-02-22 DIAGNOSIS — C541 Malignant neoplasm of endometrium: Secondary | ICD-10-CM | POA: Diagnosis not present

## 2016-02-25 DIAGNOSIS — C541 Malignant neoplasm of endometrium: Secondary | ICD-10-CM | POA: Diagnosis not present

## 2016-02-25 DIAGNOSIS — C786 Secondary malignant neoplasm of retroperitoneum and peritoneum: Secondary | ICD-10-CM | POA: Diagnosis not present

## 2016-02-29 DIAGNOSIS — C786 Secondary malignant neoplasm of retroperitoneum and peritoneum: Secondary | ICD-10-CM | POA: Diagnosis not present

## 2016-02-29 DIAGNOSIS — C541 Malignant neoplasm of endometrium: Secondary | ICD-10-CM | POA: Diagnosis not present

## 2016-03-03 DIAGNOSIS — C786 Secondary malignant neoplasm of retroperitoneum and peritoneum: Secondary | ICD-10-CM | POA: Diagnosis not present

## 2016-03-03 DIAGNOSIS — C541 Malignant neoplasm of endometrium: Secondary | ICD-10-CM | POA: Diagnosis not present

## 2016-03-07 DIAGNOSIS — C786 Secondary malignant neoplasm of retroperitoneum and peritoneum: Secondary | ICD-10-CM | POA: Diagnosis not present

## 2016-03-07 DIAGNOSIS — C541 Malignant neoplasm of endometrium: Secondary | ICD-10-CM | POA: Diagnosis not present

## 2016-03-10 DIAGNOSIS — M25572 Pain in left ankle and joints of left foot: Secondary | ICD-10-CM | POA: Diagnosis not present

## 2016-03-10 DIAGNOSIS — M79672 Pain in left foot: Secondary | ICD-10-CM | POA: Diagnosis not present

## 2016-03-10 DIAGNOSIS — C786 Secondary malignant neoplasm of retroperitoneum and peritoneum: Secondary | ICD-10-CM | POA: Diagnosis not present

## 2016-03-10 DIAGNOSIS — Z515 Encounter for palliative care: Secondary | ICD-10-CM | POA: Diagnosis not present

## 2016-03-10 DIAGNOSIS — R600 Localized edema: Secondary | ICD-10-CM | POA: Diagnosis not present

## 2016-03-10 DIAGNOSIS — C541 Malignant neoplasm of endometrium: Secondary | ICD-10-CM | POA: Diagnosis not present

## 2016-03-12 NOTE — Progress Notes (Signed)
GYN ONC OUTPATIENT VISIT   Erica Moyer 66 y.o. female  Chief Complaint:   Surveillance endometrial cancer  Assessment : Recurrent endometrial carcinoma   Plan: NED Follow-up in 3 months Counseled regarding the signs and symptoms of recurrence. Marland Kitchen  HPI: Patient underwent a robotic laparoscopic hysterectomy bilateral salpingo-oophorectomy and sentinel lymph nodes on 02/25/2015. Final pathology showed a grade 3 endometrial cancer measuring 2.4 cm in diameter with superficial myometrial invasion. There was focal lymphatic vascular involvement. Lymph nodes and adnexa were negative for any metastatic disease. She has a postoperative difficulty with the cuff separation but ultimately received vaginal vault brachytherapy completed on 06/11/2015. In mid July she developed lower abdominal pain resulting in the above-noted evaluation with a CT scan.   She was seen by Dr. Sofie Hartigan in mid July and at that time complained of increasing pelvic and lower back pain. CT scan was obtained on July 26 demonstrated what appears to be intraperitoneal disease including a left-sided metastatic site measuring 4.6 x 3.4 x 3 cm causing mass effect on the distal third of the left ureter and moderate proximal hydronephrosis and has some mass effect on the sigmoid colon. There were 2 implants in the right hemipelvis measuring 3 x 2.2 and 2.9 x 2.3 cm. There does not seem to be upper abdominal disease.  CT Guided biopsy c/w recurrent endometrial cancer. Received adjuvant taxol/carboplatin x 6 last cycle 01/15/2016 Imaging 02/10/2016 without evidence of metastatic carcinoma.  Review of Systems:reports fatigue and neuropathy particularly of her toes.  This is compounded by bony spurs.  No naueaa, poor appetite, no diarrhea or constipation. Reports weight gain.  Otherwise non contributary ROS  Vitals: Blood pressure 127/67, pulse 92, temperature 97.6 F (36.4 C), temperature source Oral, resp. rate 18, height 5\' 7"  (1.702  m), weight 177 lb 4.8 oz (80.4 kg), SpO2 100 %.  Physical Exam: General : The patient is a healthy woman in no acute distress.  HEENT: normocephalic, extraoccular movements normal;   Lynphnodes: Supraclavicular and inguinal nodes not enlarged  Abdomen: Soft, non-tender, no ascites, no organomegally, no masses, no hernias  Pelvic:  EGBUS: Normal female  Vagina: Normal, no lesions , No discharge, tenderness or masses. Urethra and Bladder: Normal, non-tender  Cervix: Surgically absent  Uterus: Surgically absent  Rectal: normal sphincter tone, no masses, no blood  Lower extremities: No edema or varicosities. Normal range of motion    Past Medical History:  Diagnosis Date  . Cancer [facility])    endometrial cancer  . Complication of anesthesia    slow to wake up  . Frequency of urination   . GERD (gastroesophageal reflux disease)   . Headache    hx migraines  . Hyperlipidemia   . Osteopenia   . PONV (postoperative nausea and vomiting)   . Radiation 05/19/15-06/11/15   vaginal cuff 30 Gy  . Urgency incontinence     Past Surgical History:  Procedure Laterality Date  . CHOLECYSTECTOMY    . ROBOTIC ASSISTED TOTAL HYSTERECTOMY WITH BILATERAL SALPINGO OOPHERECTOMY Bilateral 02/25/2015   Procedure: XI ROBOTIC ASSISTED TOTAL HYSTERECTOMY WITH BILATERAL SALPINGO OOPHORECTOMY WITH SENTINAL LYMPH NODE MAPPING;  Surgeon: [name], MD;  Location: [facility];  Service: Gynecology;  Laterality: Bilateral;  . TUBAL LIGATION     1986    Current Outpatient Prescriptions  Medication Sig Dispense Refill  . alendronate (FOSAMAX) 35 MG tablet Take 35 mg by mouth every 7 (seven) days. Take with a full glass of water on an empty stomach.    Marland Kitchen  calcium carbonate (TUMS - DOSED IN MG ELEMENTAL CALCIUM) 500 MG chewable tablet Chew 1 tablet by mouth 3 (three) times daily as needed for indigestion or heartburn. Reported on 05/19/2015    . Calcium Carbonate-Vitamin D (CALTRATE 600+D PO) Take 600 mg by mouth every  morning. Reported on 07/08/2015    . Cetirizine HCl (ZYRTEC ALLERGY) 10 MG CAPS Take 10 mg by mouth every morning.    . conjugated estrogens (PREMARIN) vaginal cream Place 1 Applicatorful vaginally daily. Reported on 08/25/2015    . hydrocortisone (ANUSOL-HC) 25 MG suppository INSERT 1 (ONE) SUPPOSITORY RECTALLY AT BEDTIME, AS NEEDED FOR HEMORRHOIDS  1  . ibuprofen (ADVIL,MOTRIN) 200 MG tablet Take 200 mg by mouth every 6 (six) hours as needed (pain). Reported on 04/15/2015    . mometasone (NASONEX) 50 MCG/ACT nasal spray INHALE 1 SPRAYS IN EACH NOSTRIL DAILY AS NEEDED for congestion  12  . Multiple Vitamin (MULTIVITAMIN) tablet Take 1 tablet by mouth daily. Reported on 05/19/2015    . polyethylene glycol (MIRALAX / GLYCOLAX) packet Take 17 g by mouth as needed.    Marland Kitchen PROCTOSOL HC 2.5 % rectal cream APPLY TO RECTUM TWO TIMES DAILY, AS NEEDED FOR HEMORRHOIDS  1  . rosuvastatin (CRESTOR) 10 MG tablet Take 10 mg by mouth at bedtime.   3  . traMADol (ULTRAM) 50 MG tablet Take by mouth every 6 (six) hours as needed.     No current facility-administered medications for this visit.     Social History   Social History  . Marital status: Married    Spouse name: N/A  . Number of children: 1  . Years of education: N/A   Occupational History  . retired Pharmacist, hospital    Social History Main Topics  . Smoking status: Never Smoker  . Smokeless tobacco: Never Used  . Alcohol use No  . Drug use: No  . Sexual activity: Not on file   Other Topics Concern  . Not on file   Social History Narrative  . No narrative on file    Family History  Problem Relation Age of Onset  . Anesthesia problems Mother   . Hypertension Mother   . Hypertension Father   . Stroke Father   . Cancer Paternal Grandmother     not sure what type

## 2016-03-13 ENCOUNTER — Ambulatory Visit: Payer: PPO | Attending: Gynecologic Oncology | Admitting: Gynecologic Oncology

## 2016-03-13 ENCOUNTER — Encounter: Payer: Self-pay | Admitting: Gynecologic Oncology

## 2016-03-13 VITALS — BP 127/67 | HR 92 | Temp 97.6°F | Resp 18 | Ht 67.0 in | Wt 177.3 lb

## 2016-03-13 DIAGNOSIS — Z79899 Other long term (current) drug therapy: Secondary | ICD-10-CM | POA: Diagnosis not present

## 2016-03-13 DIAGNOSIS — C541 Malignant neoplasm of endometrium: Secondary | ICD-10-CM | POA: Diagnosis not present

## 2016-03-13 NOTE — Patient Instructions (Signed)
Please follow up in 3 months with Dr Skeet Latch

## 2016-03-14 DIAGNOSIS — C541 Malignant neoplasm of endometrium: Secondary | ICD-10-CM | POA: Diagnosis not present

## 2016-03-14 DIAGNOSIS — C786 Secondary malignant neoplasm of retroperitoneum and peritoneum: Secondary | ICD-10-CM | POA: Diagnosis not present

## 2016-03-17 DIAGNOSIS — C786 Secondary malignant neoplasm of retroperitoneum and peritoneum: Secondary | ICD-10-CM | POA: Diagnosis not present

## 2016-03-17 DIAGNOSIS — C541 Malignant neoplasm of endometrium: Secondary | ICD-10-CM | POA: Diagnosis not present

## 2016-03-20 DIAGNOSIS — M79672 Pain in left foot: Secondary | ICD-10-CM | POA: Diagnosis not present

## 2016-03-21 DIAGNOSIS — C541 Malignant neoplasm of endometrium: Secondary | ICD-10-CM | POA: Diagnosis not present

## 2016-03-21 DIAGNOSIS — C786 Secondary malignant neoplasm of retroperitoneum and peritoneum: Secondary | ICD-10-CM | POA: Diagnosis not present

## 2016-03-24 DIAGNOSIS — C541 Malignant neoplasm of endometrium: Secondary | ICD-10-CM | POA: Diagnosis not present

## 2016-03-24 DIAGNOSIS — C55 Malignant neoplasm of uterus, part unspecified: Secondary | ICD-10-CM | POA: Diagnosis not present

## 2016-03-24 DIAGNOSIS — C786 Secondary malignant neoplasm of retroperitoneum and peritoneum: Secondary | ICD-10-CM | POA: Diagnosis not present

## 2016-03-28 DIAGNOSIS — C786 Secondary malignant neoplasm of retroperitoneum and peritoneum: Secondary | ICD-10-CM | POA: Diagnosis not present

## 2016-03-28 DIAGNOSIS — C541 Malignant neoplasm of endometrium: Secondary | ICD-10-CM | POA: Diagnosis not present

## 2016-03-31 DIAGNOSIS — C541 Malignant neoplasm of endometrium: Secondary | ICD-10-CM | POA: Diagnosis not present

## 2016-03-31 DIAGNOSIS — C786 Secondary malignant neoplasm of retroperitoneum and peritoneum: Secondary | ICD-10-CM | POA: Diagnosis not present

## 2016-04-04 DIAGNOSIS — C541 Malignant neoplasm of endometrium: Secondary | ICD-10-CM | POA: Diagnosis not present

## 2016-04-04 DIAGNOSIS — C786 Secondary malignant neoplasm of retroperitoneum and peritoneum: Secondary | ICD-10-CM | POA: Diagnosis not present

## 2016-04-07 DIAGNOSIS — C541 Malignant neoplasm of endometrium: Secondary | ICD-10-CM | POA: Diagnosis not present

## 2016-04-12 DIAGNOSIS — C786 Secondary malignant neoplasm of retroperitoneum and peritoneum: Secondary | ICD-10-CM | POA: Diagnosis not present

## 2016-04-12 DIAGNOSIS — C541 Malignant neoplasm of endometrium: Secondary | ICD-10-CM | POA: Diagnosis not present

## 2016-04-19 DIAGNOSIS — C786 Secondary malignant neoplasm of retroperitoneum and peritoneum: Secondary | ICD-10-CM | POA: Diagnosis not present

## 2016-04-19 DIAGNOSIS — C541 Malignant neoplasm of endometrium: Secondary | ICD-10-CM | POA: Diagnosis not present

## 2016-04-20 DIAGNOSIS — R201 Hypoesthesia of skin: Secondary | ICD-10-CM | POA: Diagnosis not present

## 2016-04-20 DIAGNOSIS — C55 Malignant neoplasm of uterus, part unspecified: Secondary | ICD-10-CM | POA: Diagnosis not present

## 2016-04-20 DIAGNOSIS — R2681 Unsteadiness on feet: Secondary | ICD-10-CM | POA: Diagnosis not present

## 2016-04-20 DIAGNOSIS — R5383 Other fatigue: Secondary | ICD-10-CM | POA: Diagnosis not present

## 2016-04-20 DIAGNOSIS — R2689 Other abnormalities of gait and mobility: Secondary | ICD-10-CM | POA: Diagnosis not present

## 2016-04-20 DIAGNOSIS — M6281 Muscle weakness (generalized): Secondary | ICD-10-CM | POA: Diagnosis not present

## 2016-04-26 DIAGNOSIS — T451X5A Adverse effect of antineoplastic and immunosuppressive drugs, initial encounter: Secondary | ICD-10-CM | POA: Diagnosis not present

## 2016-04-26 DIAGNOSIS — G620 Drug-induced polyneuropathy: Secondary | ICD-10-CM | POA: Diagnosis not present

## 2016-04-26 DIAGNOSIS — C541 Malignant neoplasm of endometrium: Secondary | ICD-10-CM | POA: Diagnosis not present

## 2016-04-28 DIAGNOSIS — R5383 Other fatigue: Secondary | ICD-10-CM | POA: Diagnosis not present

## 2016-04-28 DIAGNOSIS — R2689 Other abnormalities of gait and mobility: Secondary | ICD-10-CM | POA: Diagnosis not present

## 2016-04-28 DIAGNOSIS — R201 Hypoesthesia of skin: Secondary | ICD-10-CM | POA: Diagnosis not present

## 2016-04-28 DIAGNOSIS — R2681 Unsteadiness on feet: Secondary | ICD-10-CM | POA: Diagnosis not present

## 2016-04-28 DIAGNOSIS — M6281 Muscle weakness (generalized): Secondary | ICD-10-CM | POA: Diagnosis not present

## 2016-04-28 DIAGNOSIS — C55 Malignant neoplasm of uterus, part unspecified: Secondary | ICD-10-CM | POA: Diagnosis not present

## 2016-05-03 DIAGNOSIS — C786 Secondary malignant neoplasm of retroperitoneum and peritoneum: Secondary | ICD-10-CM | POA: Diagnosis not present

## 2016-05-03 DIAGNOSIS — R300 Dysuria: Secondary | ICD-10-CM | POA: Diagnosis not present

## 2016-05-03 DIAGNOSIS — C541 Malignant neoplasm of endometrium: Secondary | ICD-10-CM | POA: Diagnosis not present

## 2016-05-08 DIAGNOSIS — Z8542 Personal history of malignant neoplasm of other parts of uterus: Secondary | ICD-10-CM | POA: Diagnosis not present

## 2016-05-08 DIAGNOSIS — C55 Malignant neoplasm of uterus, part unspecified: Secondary | ICD-10-CM | POA: Diagnosis not present

## 2016-05-08 DIAGNOSIS — C786 Secondary malignant neoplasm of retroperitoneum and peritoneum: Secondary | ICD-10-CM | POA: Diagnosis not present

## 2016-05-10 ENCOUNTER — Encounter: Payer: Self-pay | Admitting: Radiation Oncology

## 2016-05-10 DIAGNOSIS — L293 Anogenital pruritus, unspecified: Secondary | ICD-10-CM | POA: Diagnosis not present

## 2016-05-10 DIAGNOSIS — C541 Malignant neoplasm of endometrium: Secondary | ICD-10-CM | POA: Diagnosis not present

## 2016-05-10 DIAGNOSIS — Z8542 Personal history of malignant neoplasm of other parts of uterus: Secondary | ICD-10-CM | POA: Diagnosis not present

## 2016-05-10 DIAGNOSIS — C786 Secondary malignant neoplasm of retroperitoneum and peritoneum: Secondary | ICD-10-CM | POA: Diagnosis not present

## 2016-05-15 ENCOUNTER — Ambulatory Visit: Payer: PPO | Attending: Gynecologic Oncology | Admitting: Gynecologic Oncology

## 2016-05-15 ENCOUNTER — Encounter: Payer: Self-pay | Admitting: Gynecologic Oncology

## 2016-05-15 VITALS — BP 123/66 | HR 68 | Temp 97.7°F | Resp 18 | Wt 184.7 lb

## 2016-05-15 DIAGNOSIS — Z08 Encounter for follow-up examination after completed treatment for malignant neoplasm: Secondary | ICD-10-CM | POA: Diagnosis not present

## 2016-05-15 DIAGNOSIS — Z823 Family history of stroke: Secondary | ICD-10-CM | POA: Diagnosis not present

## 2016-05-15 DIAGNOSIS — Z90722 Acquired absence of ovaries, bilateral: Secondary | ICD-10-CM | POA: Insufficient documentation

## 2016-05-15 DIAGNOSIS — Z79899 Other long term (current) drug therapy: Secondary | ICD-10-CM | POA: Insufficient documentation

## 2016-05-15 DIAGNOSIS — Z8542 Personal history of malignant neoplasm of other parts of uterus: Secondary | ICD-10-CM | POA: Insufficient documentation

## 2016-05-15 DIAGNOSIS — K219 Gastro-esophageal reflux disease without esophagitis: Secondary | ICD-10-CM | POA: Diagnosis not present

## 2016-05-15 DIAGNOSIS — Z8249 Family history of ischemic heart disease and other diseases of the circulatory system: Secondary | ICD-10-CM | POA: Insufficient documentation

## 2016-05-15 DIAGNOSIS — Z90710 Acquired absence of both cervix and uterus: Secondary | ICD-10-CM | POA: Diagnosis not present

## 2016-05-15 DIAGNOSIS — Z9221 Personal history of antineoplastic chemotherapy: Secondary | ICD-10-CM | POA: Diagnosis not present

## 2016-05-15 DIAGNOSIS — M8580 Other specified disorders of bone density and structure, unspecified site: Secondary | ICD-10-CM | POA: Insufficient documentation

## 2016-05-15 DIAGNOSIS — R300 Dysuria: Secondary | ICD-10-CM | POA: Diagnosis not present

## 2016-05-15 DIAGNOSIS — C541 Malignant neoplasm of endometrium: Secondary | ICD-10-CM

## 2016-05-15 DIAGNOSIS — E785 Hyperlipidemia, unspecified: Secondary | ICD-10-CM | POA: Diagnosis not present

## 2016-05-15 DIAGNOSIS — Z923 Personal history of irradiation: Secondary | ICD-10-CM | POA: Insufficient documentation

## 2016-05-15 NOTE — Progress Notes (Signed)
GYN ONC OUTPATIENT VISIT   Erica Moyer 66 y.o. female  Chief Complaint:   Surveillance endometrial cancer  Assessment : Recurrent endometrial carcinoma   Plan: NED Imaging 04/2016 without evidence of recurrence Follow-up in 3 months Counseled regarding the signs and symptoms of recurrence.  Dysuria Constant  Urethral buniing that is worse with micturition.  UA neg Advised to take azo Referral to urology for evaluation and recommendation .  HPI: Patient underwent a robotic laparoscopic hysterectomy bilateral salpingo-oophorectomy and sentinel lymph nodes on 02/25/2015. Final pathology showed a grade 3 endometrial cancer measuring 2.4 cm in diameter with superficial myometrial invasion. There was focal lymphatic vascular involvement. Lymph nodes and adnexa were negative for any metastatic disease. She has a postoperative difficulty with the cuff separation but ultimately received vaginal vault brachytherapy completed on 06/11/2015. In mid July she developed lower abdominal pain resulting in the above-noted evaluation with a CT scan.   She was seen by Dr. Sofie Hartigan in mid July and at that time complained of increasing pelvic and lower back pain. CT scan was obtained on July 26 demonstrated what appears to be intraperitoneal disease including a left-sided metastatic site measuring 4.6 x 3.4 x 3 cm causing mass effect on the distal third of the left ureter and moderate proximal hydronephrosis and has some mass effect on the sigmoid colon. There were 2 implants in the right hemipelvis measuring 3 x 2.2 and 2.9 x 2.3 cm. There does not seem to be upper abdominal disease.  CT Guided biopsy c/w recurrent endometrial cancer. Received adjuvant taxol/carboplatin x 6 last cycle 01/15/2016 Imaging 02/10/2016 without evidence of metastatic carcinoma.  Imaging 05/08/2016 without evidence of recurrent disease.  Reports two weeks of urinary tract burning that is worse with micturation.  UA negative,  no hematuria   Review of Systems:reports fatigue and neuropathy particularly of her toes.  Urinary tract burining, ndysuria, no fever or chills, no hematuria.No nausea, poor appetite, no diarrhea or constipation. Reports weight gain.  Otherwise non contributary ROS  Vitals: Blood pressure 123/66, pulse 68, temperature 97.7 F (36.5 C), resp. rate 18, weight 184 lb 11.2 oz (83.8 kg). Wt Readings from Last 3 Encounters:  05/15/16 184 lb 11.2 oz (83.8 kg)  03/13/16 177 lb 4.8 oz (80.4 kg)  09/16/15 179 lb 3.2 oz (81.3 kg)    Physical Exam: General : The patient is a healthy woman in no acute distress, nervous appearing  HEENT: normocephalic, extraoccular movements normal;   Lynphnodes: Supraclavicular and inguinal nodes not enlarged  Abdomen: Soft, non-tender, no ascites, no organomegally, no masses, no hernias  Pelvic:  EGBUS: Normal female  Vagina: Normal, no lesions , No discharge, tenderness or masses. Urethra and Bladder: Normal, non-tender  Cervix: Surgically absent  Uterus: Surgically absent  Lower extremities: No edema or varicosities. Normal range of motion    Past Medical History:  Diagnosis Date  . Cancer [facility])    endometrial cancer  . Complication of anesthesia    slow to wake up  . Frequency of urination   . GERD (gastroesophageal reflux disease)   . Headache    hx migraines  . Hyperlipidemia   . Osteopenia   . PONV (postoperative nausea and vomiting)   . Radiation 05/19/15-06/11/15   vaginal cuff 30 Gy  . Urgency incontinence     Past Surgical History:  Procedure Laterality Date  . CHOLECYSTECTOMY    . ROBOTIC ASSISTED TOTAL HYSTERECTOMY WITH BILATERAL SALPINGO OOPHERECTOMY Bilateral 02/25/2015   Procedure: XI ROBOTIC ASSISTED TOTAL  HYSTERECTOMY WITH BILATERAL SALPINGO OOPHORECTOMY WITH SENTINAL LYMPH NODE MAPPING;  Surgeon: [name], MD;  Location: [facility];  Service: Gynecology;  Laterality: Bilateral;  . TUBAL LIGATION     1986    Current Outpatient  Prescriptions  Medication Sig Dispense Refill  . calcium carbonate (TUMS - DOSED IN MG ELEMENTAL CALCIUM) 500 MG chewable tablet Chew 1 tablet by mouth 3 (three) times daily as needed for indigestion or heartburn. Reported on 05/19/2015    . ibuprofen (ADVIL,MOTRIN) 200 MG tablet Take 200 mg by mouth every 6 (six) hours as needed (pain). Reported on 04/15/2015    . magnesium oxide (MAG-OX) 400 MG tablet Take 400 mg by mouth 3 (three) times daily as needed.    Marland Kitchen PROCTOSOL HC 2.5 % rectal cream APPLY TO RECTUM TWO TIMES DAILY, AS NEEDED FOR HEMORRHOIDS  1  . pyridOXINE (VITAMIN B-6) 100 MG tablet Take 100 mg by mouth daily.    . rosuvastatin (CRESTOR) 10 MG tablet Take 10 mg by mouth at bedtime.   3   No current facility-administered medications for this visit.     Social History   Social History  . Marital status: Married    Spouse name: N/A  . Number of children: 1  . Years of education: N/A   Occupational History  . retired Pharmacist, hospital    Social History Main Topics  . Smoking status: Never Smoker  . Smokeless tobacco: Never Used  . Alcohol use No  . Drug use: No  . Sexual activity: Not on file   Other Topics Concern  . Not on file   Social History Narrative  . No narrative on file    Family History  Problem Relation Age of Onset  . Anesthesia problems Mother   . Hypertension Mother   . Hypertension Father   . Stroke Father   . Cancer Paternal Grandmother     not sure what type

## 2016-05-15 NOTE — Patient Instructions (Signed)
Dr. Skeet Latch will see you in 3 months. Call in May for appointment for appointment in July. She is making a referral to [facility].

## 2016-05-17 DIAGNOSIS — C55 Malignant neoplasm of uterus, part unspecified: Secondary | ICD-10-CM | POA: Diagnosis not present

## 2016-05-19 DIAGNOSIS — E559 Vitamin D deficiency, unspecified: Secondary | ICD-10-CM | POA: Diagnosis not present

## 2016-05-19 DIAGNOSIS — Z79899 Other long term (current) drug therapy: Secondary | ICD-10-CM | POA: Diagnosis not present

## 2016-05-19 DIAGNOSIS — E785 Hyperlipidemia, unspecified: Secondary | ICD-10-CM | POA: Diagnosis not present

## 2016-05-19 DIAGNOSIS — C541 Malignant neoplasm of endometrium: Secondary | ICD-10-CM | POA: Diagnosis not present

## 2016-05-24 DIAGNOSIS — C541 Malignant neoplasm of endometrium: Secondary | ICD-10-CM | POA: Diagnosis not present

## 2016-06-07 DIAGNOSIS — R300 Dysuria: Secondary | ICD-10-CM | POA: Diagnosis not present

## 2016-06-07 DIAGNOSIS — C768 Malignant neoplasm of other specified ill-defined sites: Secondary | ICD-10-CM | POA: Diagnosis not present

## 2016-06-07 DIAGNOSIS — C541 Malignant neoplasm of endometrium: Secondary | ICD-10-CM | POA: Diagnosis not present

## 2016-06-09 DIAGNOSIS — Z0000 Encounter for general adult medical examination without abnormal findings: Secondary | ICD-10-CM | POA: Diagnosis not present

## 2016-06-09 DIAGNOSIS — Z1389 Encounter for screening for other disorder: Secondary | ICD-10-CM | POA: Diagnosis not present

## 2016-06-09 DIAGNOSIS — Z8542 Personal history of malignant neoplasm of other parts of uterus: Secondary | ICD-10-CM | POA: Diagnosis not present

## 2016-06-09 DIAGNOSIS — Z6829 Body mass index (BMI) 29.0-29.9, adult: Secondary | ICD-10-CM | POA: Diagnosis not present

## 2016-06-09 DIAGNOSIS — E7800 Pure hypercholesterolemia, unspecified: Secondary | ICD-10-CM | POA: Diagnosis not present

## 2016-06-14 DIAGNOSIS — N1339 Other hydronephrosis: Secondary | ICD-10-CM | POA: Diagnosis not present

## 2016-06-14 DIAGNOSIS — R3982 Chronic bladder pain: Secondary | ICD-10-CM | POA: Diagnosis not present

## 2016-06-16 DIAGNOSIS — R2681 Unsteadiness on feet: Secondary | ICD-10-CM | POA: Diagnosis not present

## 2016-06-16 DIAGNOSIS — R201 Hypoesthesia of skin: Secondary | ICD-10-CM | POA: Diagnosis not present

## 2016-06-16 DIAGNOSIS — R5383 Other fatigue: Secondary | ICD-10-CM | POA: Diagnosis not present

## 2016-06-16 DIAGNOSIS — M6281 Muscle weakness (generalized): Secondary | ICD-10-CM | POA: Diagnosis not present

## 2016-06-16 DIAGNOSIS — R2689 Other abnormalities of gait and mobility: Secondary | ICD-10-CM | POA: Diagnosis not present

## 2016-06-16 DIAGNOSIS — C55 Malignant neoplasm of uterus, part unspecified: Secondary | ICD-10-CM | POA: Diagnosis not present

## 2016-06-21 DIAGNOSIS — Z8589 Personal history of malignant neoplasm of other organs and systems: Secondary | ICD-10-CM | POA: Diagnosis not present

## 2016-06-21 DIAGNOSIS — C786 Secondary malignant neoplasm of retroperitoneum and peritoneum: Secondary | ICD-10-CM | POA: Diagnosis not present

## 2016-06-21 DIAGNOSIS — C7989 Secondary malignant neoplasm of other specified sites: Secondary | ICD-10-CM | POA: Diagnosis not present

## 2016-06-26 ENCOUNTER — Telehealth: Payer: Self-pay | Admitting: *Deleted

## 2016-06-26 NOTE — Telephone Encounter (Signed)
Patient called and scheduled a follow up appt. Patient given date/time of appt.

## 2016-07-12 DIAGNOSIS — C786 Secondary malignant neoplasm of retroperitoneum and peritoneum: Secondary | ICD-10-CM | POA: Diagnosis not present

## 2016-07-12 DIAGNOSIS — Z515 Encounter for palliative care: Secondary | ICD-10-CM | POA: Diagnosis not present

## 2016-07-12 DIAGNOSIS — C541 Malignant neoplasm of endometrium: Secondary | ICD-10-CM | POA: Diagnosis not present

## 2016-07-14 DIAGNOSIS — C55 Malignant neoplasm of uterus, part unspecified: Secondary | ICD-10-CM | POA: Diagnosis not present

## 2016-07-14 DIAGNOSIS — R2689 Other abnormalities of gait and mobility: Secondary | ICD-10-CM | POA: Diagnosis not present

## 2016-07-14 DIAGNOSIS — R201 Hypoesthesia of skin: Secondary | ICD-10-CM | POA: Diagnosis not present

## 2016-07-14 DIAGNOSIS — R2681 Unsteadiness on feet: Secondary | ICD-10-CM | POA: Diagnosis not present

## 2016-07-14 DIAGNOSIS — M6281 Muscle weakness (generalized): Secondary | ICD-10-CM | POA: Diagnosis not present

## 2016-07-14 DIAGNOSIS — R5383 Other fatigue: Secondary | ICD-10-CM | POA: Diagnosis not present

## 2016-07-24 DIAGNOSIS — D1801 Hemangioma of skin and subcutaneous tissue: Secondary | ICD-10-CM | POA: Diagnosis not present

## 2016-07-24 DIAGNOSIS — D2239 Melanocytic nevi of other parts of face: Secondary | ICD-10-CM | POA: Diagnosis not present

## 2016-07-24 DIAGNOSIS — D225 Melanocytic nevi of trunk: Secondary | ICD-10-CM | POA: Diagnosis not present

## 2016-08-02 DIAGNOSIS — C541 Malignant neoplasm of endometrium: Secondary | ICD-10-CM | POA: Diagnosis not present

## 2016-08-02 DIAGNOSIS — C786 Secondary malignant neoplasm of retroperitoneum and peritoneum: Secondary | ICD-10-CM | POA: Diagnosis not present

## 2016-08-02 DIAGNOSIS — Z515 Encounter for palliative care: Secondary | ICD-10-CM | POA: Diagnosis not present

## 2016-08-14 DIAGNOSIS — M6281 Muscle weakness (generalized): Secondary | ICD-10-CM | POA: Diagnosis not present

## 2016-08-14 DIAGNOSIS — C55 Malignant neoplasm of uterus, part unspecified: Secondary | ICD-10-CM | POA: Diagnosis not present

## 2016-08-14 DIAGNOSIS — R2689 Other abnormalities of gait and mobility: Secondary | ICD-10-CM | POA: Diagnosis not present

## 2016-08-14 DIAGNOSIS — R5383 Other fatigue: Secondary | ICD-10-CM | POA: Diagnosis not present

## 2016-08-14 DIAGNOSIS — R201 Hypoesthesia of skin: Secondary | ICD-10-CM | POA: Diagnosis not present

## 2016-08-14 DIAGNOSIS — R2681 Unsteadiness on feet: Secondary | ICD-10-CM | POA: Diagnosis not present

## 2016-08-17 ENCOUNTER — Encounter: Payer: Self-pay | Admitting: Gynecologic Oncology

## 2016-08-17 ENCOUNTER — Ambulatory Visit: Payer: PPO | Attending: Gynecologic Oncology | Admitting: Gynecologic Oncology

## 2016-08-17 VITALS — BP 105/65 | HR 70 | Temp 98.4°F | Resp 18 | Wt 191.1 lb

## 2016-08-17 DIAGNOSIS — Z9221 Personal history of antineoplastic chemotherapy: Secondary | ICD-10-CM | POA: Diagnosis not present

## 2016-08-17 DIAGNOSIS — C541 Malignant neoplasm of endometrium: Secondary | ICD-10-CM

## 2016-08-17 DIAGNOSIS — Z8542 Personal history of malignant neoplasm of other parts of uterus: Secondary | ICD-10-CM | POA: Insufficient documentation

## 2016-08-17 DIAGNOSIS — Z9079 Acquired absence of other genital organ(s): Secondary | ICD-10-CM | POA: Diagnosis not present

## 2016-08-17 DIAGNOSIS — Z90710 Acquired absence of both cervix and uterus: Secondary | ICD-10-CM | POA: Diagnosis not present

## 2016-08-17 DIAGNOSIS — M8580 Other specified disorders of bone density and structure, unspecified site: Secondary | ICD-10-CM | POA: Diagnosis not present

## 2016-08-17 DIAGNOSIS — K219 Gastro-esophageal reflux disease without esophagitis: Secondary | ICD-10-CM | POA: Diagnosis not present

## 2016-08-17 DIAGNOSIS — Z08 Encounter for follow-up examination after completed treatment for malignant neoplasm: Secondary | ICD-10-CM | POA: Insufficient documentation

## 2016-08-17 DIAGNOSIS — Z8544 Personal history of malignant neoplasm of other female genital organs: Secondary | ICD-10-CM | POA: Insufficient documentation

## 2016-08-17 DIAGNOSIS — Z90722 Acquired absence of ovaries, bilateral: Secondary | ICD-10-CM | POA: Insufficient documentation

## 2016-08-17 DIAGNOSIS — G629 Polyneuropathy, unspecified: Secondary | ICD-10-CM | POA: Diagnosis not present

## 2016-08-17 DIAGNOSIS — E785 Hyperlipidemia, unspecified: Secondary | ICD-10-CM | POA: Diagnosis not present

## 2016-08-17 NOTE — Progress Notes (Signed)
GYN ONC OUTPATIENT VISIT   Erica Moyer 66 y.o. female  Chief Complaint:   Surveillance endometrial cancer  Assessment : Recurrent endometrial carcinoma   Plan: NED Imaging 04/2016 without evidence of recurrence.  Repeat imaging scheduled for September 2018 Follow-up in 3 months Counseled regarding the signs and symptoms of recurrence. Follow-up 11/10/2016 .  HPI: Patient underwent a robotic laparoscopic hysterectomy bilateral salpingo-oophorectomy and sentinel lymph nodes on 02/25/2015. Final pathology showed a grade 3 endometrial cancer measuring 2.4 cm in diameter with superficial myometrial invasion. There was focal lymphatic vascular involvement. Lymph nodes and adnexa were negative for any metastatic disease. She has a postoperative difficulty with the cuff separation but ultimately received vaginal vault brachytherapy completed on 06/11/2015. In mid July she developed lower abdominal pain resulting in the above-noted evaluation with a CT scan.   She was seen by Dr. Sofie Hartigan in mid July and at that time complained of increasing pelvic and lower back pain. CT scan was obtained on July 26 demonstrated what appears to be intraperitoneal disease including a left-sided metastatic site measuring 4.6 x 3.4 x 3 cm causing mass effect on the distal third of the left ureter and moderate proximal hydronephrosis and has some mass effect on the sigmoid colon. There were 2 implants in the right hemipelvis measuring 3 x 2.2 and 2.9 x 2.3 cm. There does not seem to be upper abdominal disease.  CT Guided biopsy c/w recurrent endometrial cancer. Received adjuvant taxol/carboplatin x 6 last cycle 01/15/2016 Imaging 02/10/2016 without evidence of metastatic carcinoma.  Imaging 05/08/2016 without evidence of recurrent disease. Doing well. Is working with physical therapy to improve lower extremity strength and manage neuropathy, good appetite no vaginal or rectal bleeding. With urination with intake of  spicy foods. This has been evaluated by her urologist.  Review of Systems:reports improvement in fatigue and stable neuropathy particularly of her toes.  Urinary tract burining with spicy foods. No fever or chills, No nausea, poor appetite, no diarrhea or constipation. Reports weight gain.  Otherwise non contributary ROS  Vitals: Blood pressure 105/65, pulse 70, temperature 98.4 F (36.9 C), temperature source Oral, resp. rate 18, weight 191 lb 1.6 oz (86.7 kg), SpO2 100 %. Wt Readings from Last 3 Encounters:  08/17/16 191 lb 1.6 oz (86.7 kg)  05/15/16 184 lb 11.2 oz (83.8 kg)  03/13/16 177 lb 4.8 oz (80.4 kg)    Physical Exam: General : The patient is a healthy woman in no acute distress, HEENT: normocephalic, extraoccular movements normal; Sclera nonicteric  Lynphnodes: Supraclavicular and inguinal nodes not enlarged  Abdomen: Soft, non-tender, no ascites, no organomegally, no masses, no hernias  Pelvic:  EGBUS: Normal female  Vagina: Normal, no lesions , No discharge, tenderness or masses. Atrophic Urethra and Bladder: Normal, non-tender  Cervix: Surgically absent  Uterus: Surgically absent  Lower extremities: No edema or varicosities. Normal range of motion  Rectal: Good tone no masses no rectovaginal septum nodularity   Past Medical History:  Diagnosis Date  . Cancer [facility])    endometrial cancer  . Complication of anesthesia    slow to wake up  . Frequency of urination   . GERD (gastroesophageal reflux disease)   . Headache    hx migraines  . Hyperlipidemia   . Osteopenia   . PONV (postoperative nausea and vomiting)   . Radiation 05/19/15-06/11/15   vaginal cuff 30 Gy  . Urgency incontinence     Past Surgical History:  Procedure Laterality Date  . CHOLECYSTECTOMY    .  ROBOTIC ASSISTED TOTAL HYSTERECTOMY WITH BILATERAL SALPINGO OOPHERECTOMY Bilateral 02/25/2015   Procedure: XI ROBOTIC ASSISTED TOTAL HYSTERECTOMY WITH BILATERAL SALPINGO OOPHORECTOMY WITH SENTINAL LYMPH  NODE MAPPING;  Surgeon: [name], MD;  Location: [facility];  Service: Gynecology;  Laterality: Bilateral;  . TUBAL LIGATION     1986    Current Outpatient Prescriptions  Medication Sig Dispense Refill  . calcium carbonate (TUMS - DOSED IN MG ELEMENTAL CALCIUM) 500 MG chewable tablet Chew 1 tablet by mouth 3 (three) times daily as needed for indigestion or heartburn. Reported on 05/19/2015    . gabapentin (NEURONTIN) 300 MG capsule Take 300 mg by mouth 3 (three) times daily.    Marland Kitchen ibuprofen (ADVIL,MOTRIN) 200 MG tablet Take 200 mg by mouth every 6 (six) hours as needed (pain). Reported on 04/15/2015    . mometasone (NASONEX) 50 MCG/ACT nasal spray Place 2 sprays into the nose daily as needed.    . pyridOXINE (VITAMIN B-6) 100 MG tablet Take 100 mg by mouth daily.    . rosuvastatin (CRESTOR) 10 MG tablet Take 10 mg by mouth at bedtime.   3   No current facility-administered medications for this visit.     Social History   Social History  . Marital status: Married    Spouse name: N/A  . Number of children: 1  . Years of education: N/A   Occupational History  . retired Pharmacist, hospital    Social History Main Topics  . Smoking status: Never Smoker  . Smokeless tobacco: Never Used  . Alcohol use No  . Drug use: No  . Sexual activity: Not on file   Other Topics Concern  . Not on file   Social History Narrative  . No narrative on file    Family History  Problem Relation Age of Onset  . Anesthesia problems Mother   . Hypertension Mother   . Hypertension Father   . Stroke Father   . Cancer Paternal Grandmother        not sure what type

## 2016-08-17 NOTE — Patient Instructions (Signed)
Plan to follow up in Sept 2018 or sooner if needed.  Plan to have Dr. Hinton Rao to get a CT before that time.

## 2016-08-23 DIAGNOSIS — C541 Malignant neoplasm of endometrium: Secondary | ICD-10-CM | POA: Diagnosis not present

## 2016-08-23 DIAGNOSIS — C786 Secondary malignant neoplasm of retroperitoneum and peritoneum: Secondary | ICD-10-CM | POA: Diagnosis not present

## 2016-09-07 DIAGNOSIS — H2513 Age-related nuclear cataract, bilateral: Secondary | ICD-10-CM | POA: Diagnosis not present

## 2016-09-13 DIAGNOSIS — M8580 Other specified disorders of bone density and structure, unspecified site: Secondary | ICD-10-CM | POA: Diagnosis not present

## 2016-09-13 DIAGNOSIS — C786 Secondary malignant neoplasm of retroperitoneum and peritoneum: Secondary | ICD-10-CM | POA: Diagnosis not present

## 2016-09-13 DIAGNOSIS — F329 Major depressive disorder, single episode, unspecified: Secondary | ICD-10-CM | POA: Diagnosis not present

## 2016-09-13 DIAGNOSIS — R5383 Other fatigue: Secondary | ICD-10-CM | POA: Diagnosis not present

## 2016-09-13 DIAGNOSIS — C541 Malignant neoplasm of endometrium: Secondary | ICD-10-CM | POA: Diagnosis not present

## 2016-09-13 DIAGNOSIS — K219 Gastro-esophageal reflux disease without esophagitis: Secondary | ICD-10-CM | POA: Diagnosis not present

## 2016-09-13 DIAGNOSIS — G629 Polyneuropathy, unspecified: Secondary | ICD-10-CM | POA: Diagnosis not present

## 2016-09-13 DIAGNOSIS — C7989 Secondary malignant neoplasm of other specified sites: Secondary | ICD-10-CM | POA: Diagnosis not present

## 2016-10-17 DIAGNOSIS — Z23 Encounter for immunization: Secondary | ICD-10-CM | POA: Diagnosis not present

## 2016-11-01 DIAGNOSIS — C786 Secondary malignant neoplasm of retroperitoneum and peritoneum: Secondary | ICD-10-CM | POA: Diagnosis not present

## 2016-11-01 DIAGNOSIS — C541 Malignant neoplasm of endometrium: Secondary | ICD-10-CM | POA: Diagnosis not present

## 2016-11-01 DIAGNOSIS — C55 Malignant neoplasm of uterus, part unspecified: Secondary | ICD-10-CM | POA: Diagnosis not present

## 2016-11-02 ENCOUNTER — Encounter: Payer: Self-pay | Admitting: Gynecologic Oncology

## 2016-11-02 ENCOUNTER — Ambulatory Visit: Payer: PPO | Attending: Gynecologic Oncology | Admitting: Gynecologic Oncology

## 2016-11-02 VITALS — BP 129/55 | HR 68 | Temp 97.9°F | Resp 18 | Ht 67.0 in | Wt 193.7 lb

## 2016-11-02 DIAGNOSIS — Z8542 Personal history of malignant neoplasm of other parts of uterus: Secondary | ICD-10-CM | POA: Diagnosis not present

## 2016-11-02 DIAGNOSIS — M8580 Other specified disorders of bone density and structure, unspecified site: Secondary | ICD-10-CM | POA: Diagnosis not present

## 2016-11-02 DIAGNOSIS — Z79899 Other long term (current) drug therapy: Secondary | ICD-10-CM | POA: Diagnosis not present

## 2016-11-02 DIAGNOSIS — C541 Malignant neoplasm of endometrium: Secondary | ICD-10-CM | POA: Diagnosis not present

## 2016-11-02 DIAGNOSIS — E785 Hyperlipidemia, unspecified: Secondary | ICD-10-CM | POA: Insufficient documentation

## 2016-11-02 DIAGNOSIS — K219 Gastro-esophageal reflux disease without esophagitis: Secondary | ICD-10-CM | POA: Diagnosis not present

## 2016-11-02 NOTE — Progress Notes (Signed)
GYN ONC OUTPATIENT VISIT   Erica Moyer 66 y.o. female  Chief Complaint:   Surveillance endometrial cancer  Assessment : Recurrent endometrial carcinoma   Plan: NED since 01/2016 Follow-up with Dr. Skeet Latch in 6 months. Follow-up with Dr. Anabel Bene tomorrow and again in 3 months   HPI:   Oncology History   Erica Moyer reports having an episode of vaginal spotting in May 2016. She initially thought this was due to urethral irritation as she has bladder prolapse. She then continued to spot through the summer and fall of 2016 and was evaluated by Dr. Marvel Plan on 01/19/2015 at which time a transvaginal ultrasound scan was performed which revealed a normal size uterus measuring 7.4 x 8.4 x 3.7 cm with a thickened endometrial stripe 15 mm. The ovaries were grossly normal. She then underwent a endometrial biopsy on 01/28/2015 which revealed FIGO grade 2 moderately differentiated endometrioid adenocarcinoma     Endometrial cancer ([place])   01/28/2015 Initial Diagnosis    Endometrial cancer ([place]) FIGO grade 2 moderately differentiated endometrioid adenocarcinoma      02/25/2015 Surgery    robotic laparoscopic hysterectomy bilateral salpingo-oophorectomy and sentinel lymph nodes on       02/25/2015 Pathology Results     grade 3 endometrial cancer measuring 2.4 cm in diameter with superficial myometrial invasion. There was focal lymphatic vascular involvement. Lymph nodes and adnexa were negative for any metastatic disease.  Stage IA        - 06/11/2015 Radiation Therapy    Vaginal cuff brachytherapy      08/2015 Relapse/Recurrence     She was seen by Dr. Sofie Hartigan in mid July and at that time complained of increasing pelvic and lower back pain. CT scan was obtained on July 26 demonstrated what appears to be intraperitoneal disease including a left-sided metastatic site measuring 4.6 x 3.4 x 3 cm causing mass effect on the distal third of the left ureter and moderate proximal  hydronephrosis and has some mass effect on the sigmoid colon. There were 2 implants in the right hemipelvis measuring 3 x 2.2 and 2.9 x 2.3 cm.   CT Guided biopsy c/w recurrent endometrial cancer.       08/2015 - 01/15/2016 Chemotherapy    Received adjuvant taxol/carboplatin x 6      02/10/2016 Imaging    Imaging 02/10/2016 without evidence of metastatic carcinoma      05/08/2016 Imaging    Imaging 05/08/2016 without evidence of recurrent disease.      11/02/2016 Imaging    No evidence of metastatic disease.  Bilateral inguinal hernias containing fat        Review of Systems:reports improvement in fatigue s. No fever or chills, No nausea, Good appetite, no diarrhea or constipation. Reports weight gain.  Otherwise non contributary ROS  Vitals: Blood pressure (!) 129/55, pulse 68, temperature 97.9 F (36.6 C), temperature source Oral, resp. rate 18, height 5\' 7"  (1.702 m), weight 193 lb 11.2 oz (87.9 kg), SpO2 100 %. Wt Readings from Last 3 Encounters:  11/02/16 193 lb 11.2 oz (87.9 kg)  08/17/16 191 lb 1.6 oz (86.7 kg)  05/15/16 184 lb 11.2 oz (83.8 kg)    Physical Exam: General : The patient is a healthy woman in no acute distress, HEENT: normocephalic, extraoccular movements normal; Sclera nonicteric  Lynphnodes: Supraclavicular and inguinal nodes not enlarged  Abdomen: Soft, non-tender, no ascites, no organomegally, no masses, no hernias  Pelvic:  EGBUS: Normal female  Vagina: Normal, no lesions , No  discharge, tenderness or masses. Atrophic Urethra and Bladder: Normal, non-tender  Cervix: Surgically absent  Uterus: Surgically absent  Lower extremities: No edema or varicosities. Normal range of motion  Rectal: Good tone no masses no rectovaginal septum nodularity   Past Medical History:  Diagnosis Date  . Cancer [facility])    endometrial cancer  . Complication of anesthesia    slow to wake up  . Frequency of urination   . GERD (gastroesophageal reflux disease)   .  Headache    hx migraines  . Hyperlipidemia   . Osteopenia   . PONV (postoperative nausea and vomiting)   . Radiation 05/19/15-06/11/15   vaginal cuff 30 Gy  . Urgency incontinence     Past Surgical History:  Procedure Laterality Date  . CHOLECYSTECTOMY    . ROBOTIC ASSISTED TOTAL HYSTERECTOMY WITH BILATERAL SALPINGO OOPHERECTOMY Bilateral 02/25/2015   Procedure: XI ROBOTIC ASSISTED TOTAL HYSTERECTOMY WITH BILATERAL SALPINGO OOPHORECTOMY WITH SENTINAL LYMPH NODE MAPPING;  Surgeon: [name], MD;  Location: [facility];  Service: Gynecology;  Laterality: Bilateral;  . TUBAL LIGATION     1986    Current Outpatient Prescriptions  Medication Sig Dispense Refill  . calcium carbonate (TUMS - DOSED IN MG ELEMENTAL CALCIUM) 500 MG chewable tablet Chew 1 tablet by mouth 3 (three) times daily as needed for indigestion or heartburn. Reported on 05/19/2015    . gabapentin (NEURONTIN) 300 MG capsule Take 300 mg by mouth 3 (three) times daily.    Marland Kitchen ibuprofen (ADVIL,MOTRIN) 200 MG tablet Take 200 mg by mouth every 6 (six) hours as needed (pain). Reported on 04/15/2015    . mometasone (NASONEX) 50 MCG/ACT nasal spray Place 2 sprays into the nose daily as needed.    . pyridOXINE (VITAMIN B-6) 100 MG tablet Take 100 mg by mouth daily.    . rosuvastatin (CRESTOR) 10 MG tablet Take 10 mg by mouth at bedtime.   3   No current facility-administered medications for this visit.     Social History   Social History  . Marital status: Married    Spouse name: N/A  . Number of children: 1  . Years of education: N/A   Occupational History  . retired Pharmacist, hospital    Social History Main Topics  . Smoking status: Never Smoker  . Smokeless tobacco: Never Used  . Alcohol use No  . Drug use: No  . Sexual activity: Not on file   Other Topics Concern  . Not on file   Social History Narrative  . No narrative on file    Family History  Problem Relation Age of Onset  . Anesthesia problems Mother   . Hypertension  Mother   . Hypertension Father   . Stroke Father   . Cancer Paternal Grandmother        not sure what type

## 2016-11-02 NOTE — Patient Instructions (Signed)
Follow up with Dr. Skeet Latch in 6 months which will be March of 2019.  Please call in January of 2019 to scheduled your appointment. Phone  numberto office is [phone].

## 2016-11-03 DIAGNOSIS — Z8542 Personal history of malignant neoplasm of other parts of uterus: Secondary | ICD-10-CM | POA: Diagnosis not present

## 2016-11-03 DIAGNOSIS — M8580 Other specified disorders of bone density and structure, unspecified site: Secondary | ICD-10-CM | POA: Diagnosis not present

## 2016-11-03 DIAGNOSIS — Z90710 Acquired absence of both cervix and uterus: Secondary | ICD-10-CM | POA: Diagnosis not present

## 2016-11-03 DIAGNOSIS — Z9221 Personal history of antineoplastic chemotherapy: Secondary | ICD-10-CM | POA: Diagnosis not present

## 2016-11-03 DIAGNOSIS — C541 Malignant neoplasm of endometrium: Secondary | ICD-10-CM | POA: Diagnosis not present

## 2016-11-09 ENCOUNTER — Ambulatory Visit: Payer: PPO | Admitting: Gynecologic Oncology

## 2016-12-25 DIAGNOSIS — Z1231 Encounter for screening mammogram for malignant neoplasm of breast: Secondary | ICD-10-CM | POA: Diagnosis not present

## 2017-01-29 DIAGNOSIS — L821 Other seborrheic keratosis: Secondary | ICD-10-CM | POA: Diagnosis not present

## 2017-01-29 DIAGNOSIS — D2239 Melanocytic nevi of other parts of face: Secondary | ICD-10-CM | POA: Diagnosis not present

## 2017-01-29 DIAGNOSIS — D225 Melanocytic nevi of trunk: Secondary | ICD-10-CM | POA: Diagnosis not present

## 2017-01-29 DIAGNOSIS — D1801 Hemangioma of skin and subcutaneous tissue: Secondary | ICD-10-CM | POA: Diagnosis not present

## 2017-02-01 IMAGING — CR DG CHEST 2V
2 series · 2 of 2 positions shown · non-contrast
Comparison: No prior.

CLINICAL DATA: Endometrial cancer.

EXAM:
CHEST  2 VIEW

[w chest pa]
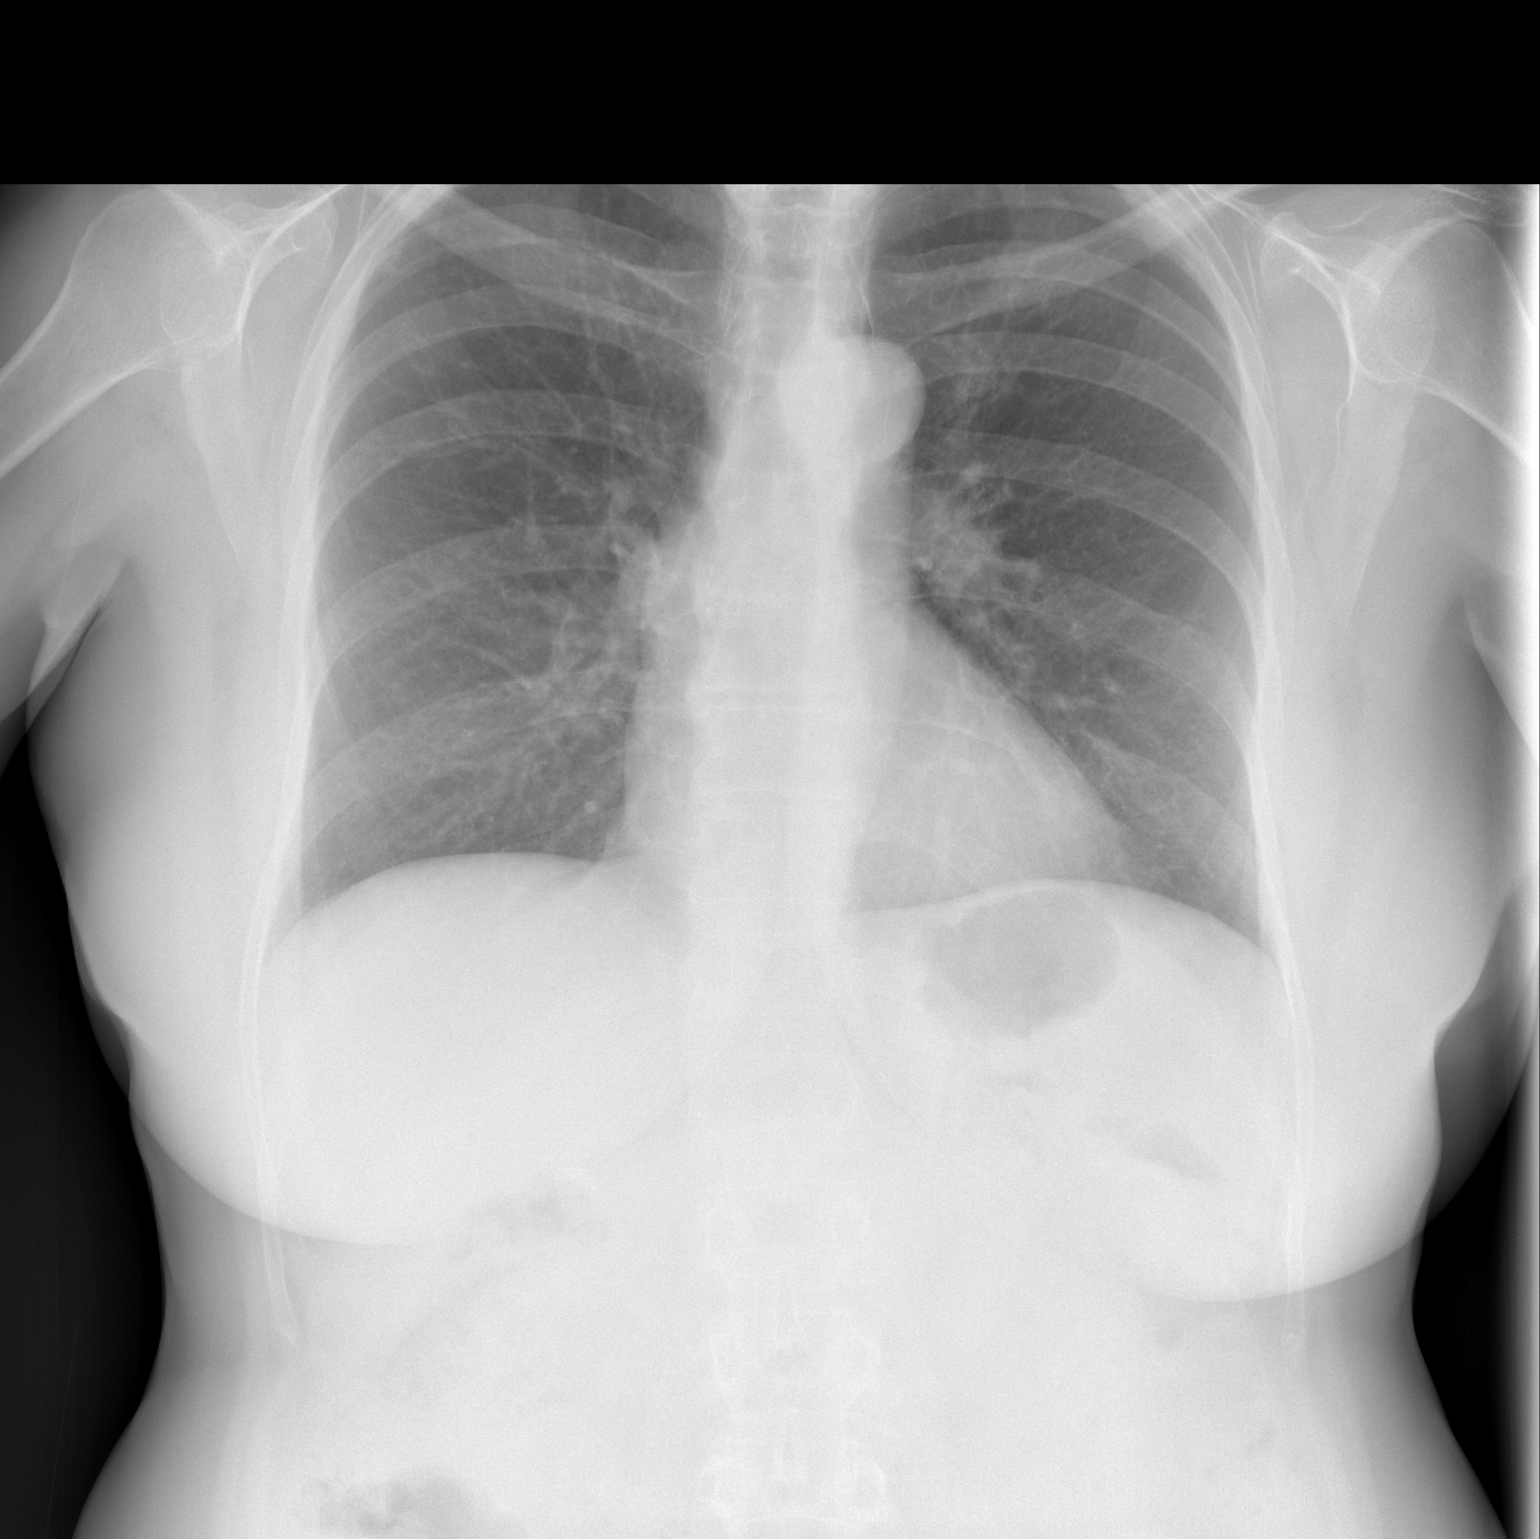

[w chest lat]
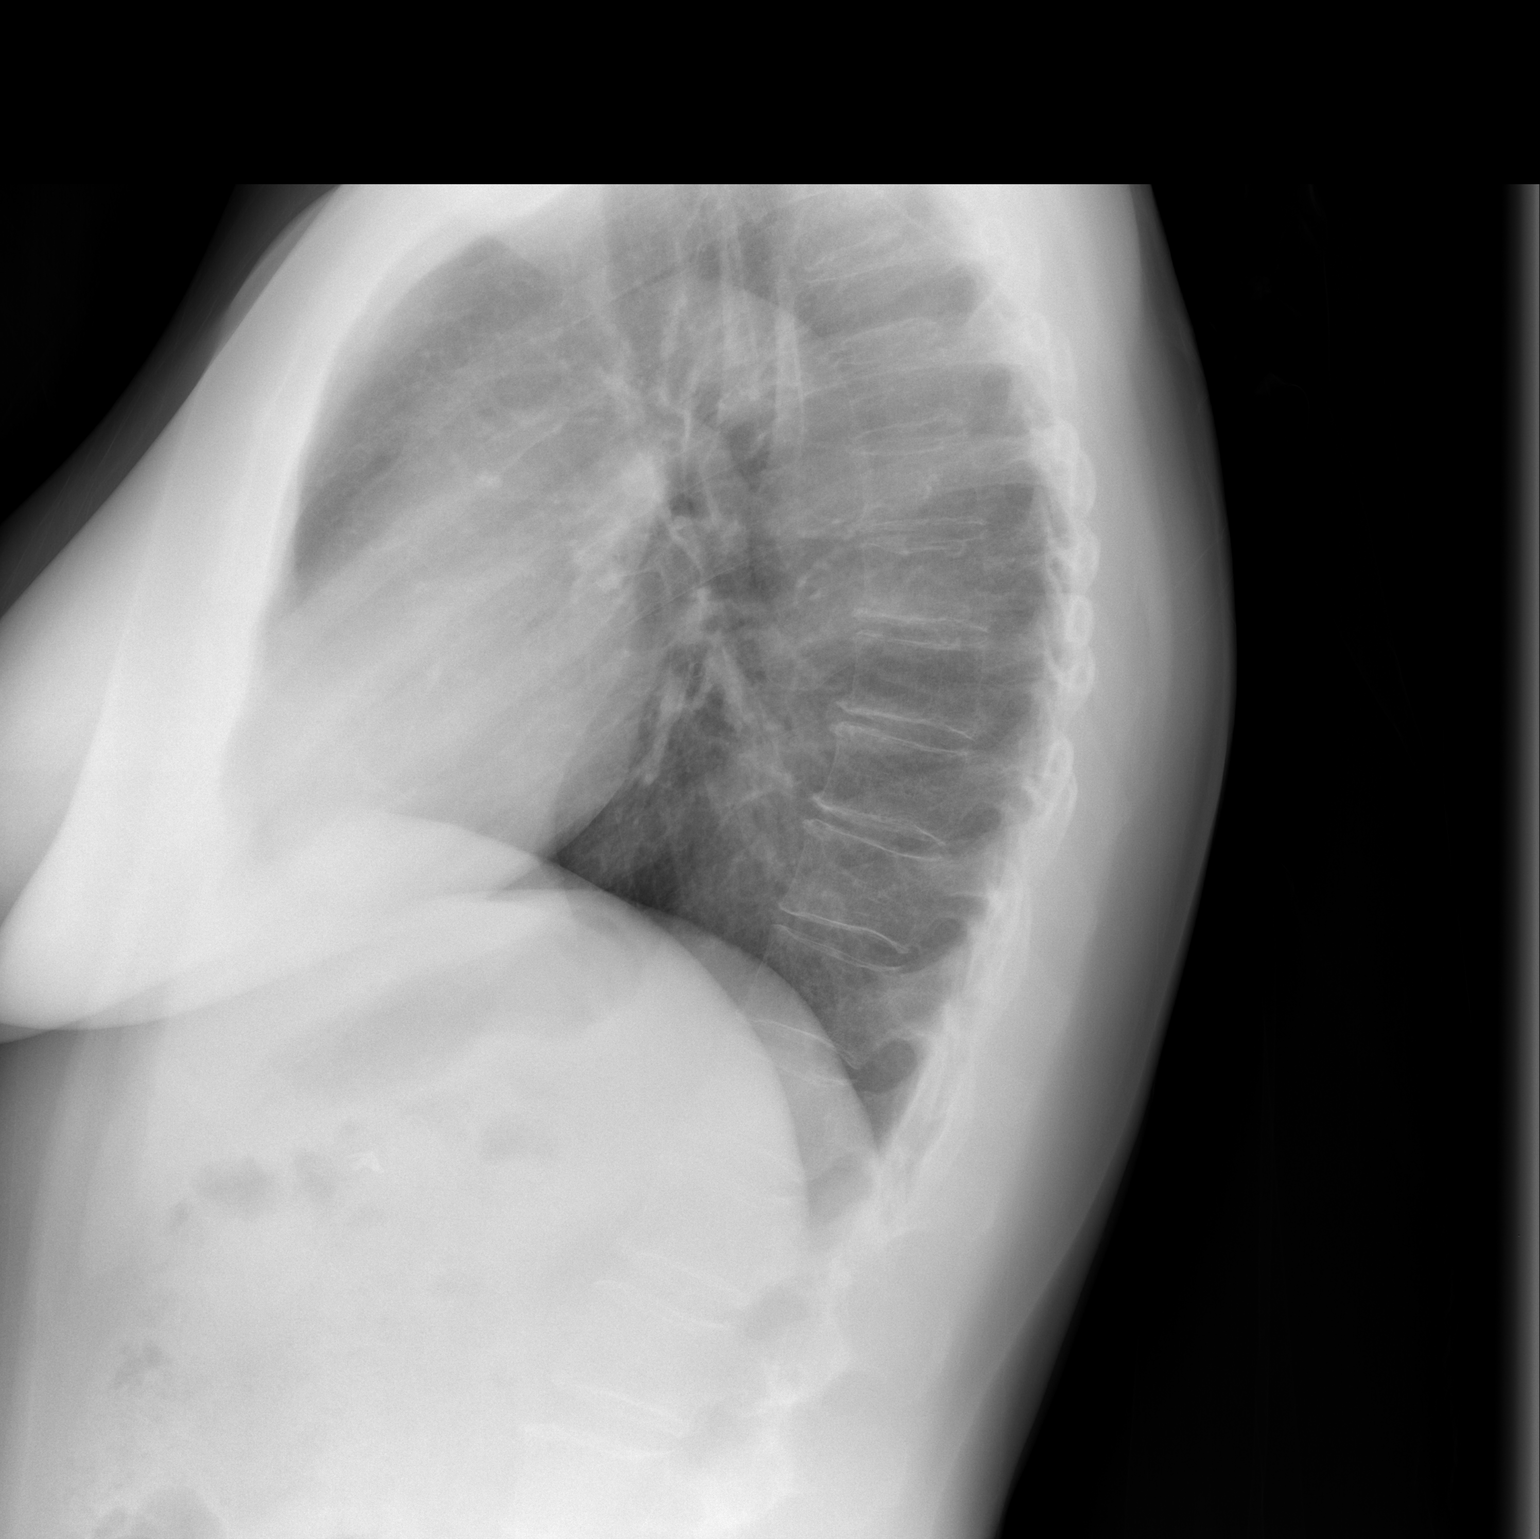

[2 of 2 positions shown; findings below may reference images not displayed]

FINDINGS: The heart size and mediastinal contours are within normal limits.
Both lungs are clear. The visualized skeletal structures are
unremarkable.
IMPRESSION: No active cardiopulmonary disease.

## 2017-02-16 DIAGNOSIS — Z8542 Personal history of malignant neoplasm of other parts of uterus: Secondary | ICD-10-CM | POA: Diagnosis not present

## 2017-02-16 DIAGNOSIS — R531 Weakness: Secondary | ICD-10-CM | POA: Diagnosis not present

## 2017-03-19 DIAGNOSIS — J069 Acute upper respiratory infection, unspecified: Secondary | ICD-10-CM | POA: Diagnosis not present

## 2017-03-19 DIAGNOSIS — Z6831 Body mass index (BMI) 31.0-31.9, adult: Secondary | ICD-10-CM | POA: Diagnosis not present

## 2017-03-19 DIAGNOSIS — B9789 Other viral agents as the cause of diseases classified elsewhere: Secondary | ICD-10-CM | POA: Diagnosis not present

## 2017-03-19 DIAGNOSIS — Z780 Asymptomatic menopausal state: Secondary | ICD-10-CM | POA: Diagnosis not present

## 2017-05-01 DIAGNOSIS — I700 Atherosclerosis of aorta: Secondary | ICD-10-CM | POA: Diagnosis not present

## 2017-05-01 DIAGNOSIS — K7689 Other specified diseases of liver: Secondary | ICD-10-CM | POA: Diagnosis not present

## 2017-05-01 DIAGNOSIS — R918 Other nonspecific abnormal finding of lung field: Secondary | ICD-10-CM | POA: Diagnosis not present

## 2017-05-01 DIAGNOSIS — Z8542 Personal history of malignant neoplasm of other parts of uterus: Secondary | ICD-10-CM | POA: Diagnosis not present

## 2017-05-02 DIAGNOSIS — R918 Other nonspecific abnormal finding of lung field: Secondary | ICD-10-CM | POA: Diagnosis not present

## 2017-05-02 DIAGNOSIS — Z8542 Personal history of malignant neoplasm of other parts of uterus: Secondary | ICD-10-CM | POA: Diagnosis not present

## 2017-05-08 ENCOUNTER — Encounter: Payer: Self-pay | Admitting: Gynecologic Oncology

## 2017-05-08 ENCOUNTER — Inpatient Hospital Stay: Payer: PPO | Attending: Gynecologic Oncology | Admitting: Gynecologic Oncology

## 2017-05-08 VITALS — BP 120/75 | HR 72 | Temp 98.0°F | Resp 18

## 2017-05-08 DIAGNOSIS — C541 Malignant neoplasm of endometrium: Secondary | ICD-10-CM

## 2017-05-08 DIAGNOSIS — Z9221 Personal history of antineoplastic chemotherapy: Secondary | ICD-10-CM

## 2017-05-08 DIAGNOSIS — Z90722 Acquired absence of ovaries, bilateral: Secondary | ICD-10-CM | POA: Diagnosis not present

## 2017-05-08 DIAGNOSIS — Z923 Personal history of irradiation: Secondary | ICD-10-CM

## 2017-05-08 DIAGNOSIS — Z90710 Acquired absence of both cervix and uterus: Secondary | ICD-10-CM

## 2017-05-08 NOTE — Patient Instructions (Signed)
Follow up with Dr. Brewster in 3 months as scheduled. 

## 2017-05-08 NOTE — Progress Notes (Signed)
GYN ONC OUTPATIENT VISIT   Erica Moyer 67 y.o. female  Chief Complaint:   Surveillance endometrial cancer  Assessment : Recurrent endometrial carcinoma   Plan: NED since 01/2016 Follow-up with Dr. Skeet Latch in 3 months. Follow-up with Dr. Anabel Bene as scheduled    HPI:   Oncology History   Erica Moyer reports having an episode of vaginal spotting in May 2016. She initially thought this was due to urethral irritation as she has bladder prolapse. She then continued to spot through the summer and fall of 2016 and was evaluated by Dr. Marvel Plan on 01/19/2015 at which time a transvaginal ultrasound scan was performed which revealed a normal size uterus measuring 7.4 x 8.4 x 3.7 cm with a thickened endometrial stripe 15 mm. The ovaries were grossly normal. She then underwent a endometrial biopsy on 01/28/2015 which revealed FIGO grade 2 moderately differentiated endometrioid adenocarcinoma     Endometrial cancer ([place])   01/28/2015 Initial Diagnosis    Endometrial cancer ([place]) FIGO grade 2 moderately differentiated endometrioid adenocarcinoma      02/25/2015 Surgery    robotic laparoscopic hysterectomy bilateral salpingo-oophorectomy and sentinel lymph nodes on       02/25/2015 Pathology Results     grade 3 endometrial cancer measuring 2.4 cm in diameter with superficial myometrial invasion. There was focal lymphatic vascular involvement. Lymph nodes and adnexa were negative for any metastatic disease.  Stage IA        - 06/11/2015 Radiation Therapy    Vaginal cuff brachytherapy      08/2015 Relapse/Recurrence     She was seen by Dr. Sofie Hartigan in mid July and at that time complained of increasing pelvic and lower back pain. CT scan was obtained on July 26 demonstrated what appears to be intraperitoneal disease including a left-sided metastatic site measuring 4.6 x 3.4 x 3 cm causing mass effect on the distal third of the left ureter and moderate proximal hydronephrosis and has  some mass effect on the sigmoid colon. There were 2 implants in the right hemipelvis measuring 3 x 2.2 and 2.9 x 2.3 cm.   CT Guided biopsy c/w recurrent endometrial cancer.       08/2015 - 01/15/2016 Chemotherapy    Received adjuvant taxol/carboplatin x 6      02/10/2016 Imaging    Imaging 02/10/2016 without evidence of metastatic carcinoma      05/08/2016 Imaging    Imaging 05/08/2016 without evidence of recurrent disease.      11/02/2016 Imaging    No evidence of metastatic disease.  Bilateral inguinal hernias containing fat      04/27/2017 Imaging    CT chest abdomen and pelvis No findings worrisome for metastatic disease in the chest abdomen or pelvis.  There is small pulmonary nodules bilaterally which are predominantly subpleural or perifissural all likely lymph nodes some of these were not previously imaged suggest attention on routine follow-up      05/01/2017 Miscellaneous    CA125    8.8        Review of Systems: No fever or chills, No nausea, Good appetite, no diarrhea or constipation. Reports weight gain.  No vaginal bleeding or discharge no changes in bowel or bladder habits no edema in the extremities no cough shortness of breath, no flank pain otherwise non contributary ROS  Vitals: Blood pressure 120/75, pulse 72, temperature 98 F (36.7 C), temperature source Oral, resp. rate 18, SpO2 100 %.  Physical Exam: General : The patient is a healthy woman  in no acute distress, HEENT: normocephalic, extraoccular movements normal; Sclera nonicteric  Lymph nodes: Supraclavicular and inguinal nodes not enlarged  Abdomen: Soft, non-tender, no ascites, no organomegally, no masses, no hernias  Pelvic:  EGBUS: Normal female  Vagina: Normal, no lesions , No discharge, tenderness or masses. Atrophic Urethra and Bladder: Normal Cervix: Surgically absent  Uterus: Surgically absent  Lower extremities: No edema or varicosities. Normal range of motion  Rectal: Good tone no  masses no rectovaginal septum nodularity   Past Medical History:  Diagnosis Date  . Cancer [facility])    endometrial cancer  . Complication of anesthesia    slow to wake up  . Frequency of urination   . GERD (gastroesophageal reflux disease)   . Headache    hx migraines  . Hyperlipidemia   . Osteopenia   . PONV (postoperative nausea and vomiting)   . Radiation 05/19/15-06/11/15   vaginal cuff 30 Gy  . Urgency incontinence     Past Surgical History:  Procedure Laterality Date  . CHOLECYSTECTOMY    . ROBOTIC ASSISTED TOTAL HYSTERECTOMY WITH BILATERAL SALPINGO OOPHERECTOMY Bilateral 02/25/2015   Procedure: XI ROBOTIC ASSISTED TOTAL HYSTERECTOMY WITH BILATERAL SALPINGO OOPHORECTOMY WITH SENTINAL LYMPH NODE MAPPING;  Surgeon: [name], MD;  Location: [facility];  Service: Gynecology;  Laterality: Bilateral;  . TUBAL LIGATION     1986    Current Outpatient Medications  Medication Sig Dispense Refill  . Calcium Carb-Cholecalciferol (CALTRATE 600+D3 SOFT PO) Take 1 tablet by mouth daily.    . calcium carbonate (TUMS - DOSED IN MG ELEMENTAL CALCIUM) 500 MG chewable tablet Chew 1 tablet by mouth 3 (three) times daily as needed for indigestion or heartburn. Reported on 05/19/2015    . gabapentin (NEURONTIN) 300 MG capsule Take 300 mg by mouth 3 (three) times daily.    Marland Kitchen ibuprofen (ADVIL,MOTRIN) 200 MG tablet Take 200 mg by mouth every 6 (six) hours as needed (pain). Reported on 04/15/2015    . mometasone (NASONEX) 50 MCG/ACT nasal spray Place 2 sprays into the nose daily as needed.    . pyridOXINE (VITAMIN B-6) 100 MG tablet Take 100 mg by mouth daily.    . ranitidine (ZANTAC) 150 MG tablet Take 150 mg by mouth at bedtime.    . rosuvastatin (CRESTOR) 10 MG tablet Take 10 mg by mouth at bedtime.   3   No current facility-administered medications for this visit.     Social History   Socioeconomic History  . Marital status: Married    Spouse name: Not on file  . Number of children: 1  .  Years of education: Not on file  . Highest education level: Not on file  Occupational History  . Occupation: retired Tour manager  . Financial resource strain: Not on file  . Food insecurity:    Worry: Not on file    Inability: Not on file  . Transportation needs:    Medical: Not on file    Non-medical: Not on file  Tobacco Use  . Smoking status: Never Smoker  . Smokeless tobacco: Never Used  Substance and Sexual Activity  . Alcohol use: No  . Drug use: No  . Sexual activity: Not on file  Lifestyle  . Physical activity:    Days per week: Not on file    Minutes per session: Not on file  . Stress: Not on file  Relationships  . Social connections:    Talks on phone: Not on file    Gets  together: Not on file    Attends religious service: Not on file    Active member of club or organization: Not on file    Attends meetings of clubs or organizations: Not on file    Relationship status: Not on file  . Intimate partner violence:    Fear of current or ex partner: Not on file    Emotionally abused: Not on file    Physically abused: Not on file    Forced sexual activity: Not on file  Other Topics Concern  . Not on file  Social History Narrative  . Not on file    Family History  Problem Relation Age of Onset  . Anesthesia problems Mother   . Hypertension Mother   . Hypertension Father   . Stroke Father   . Cancer Paternal Grandmother        not sure what type

## 2017-06-13 DIAGNOSIS — E785 Hyperlipidemia, unspecified: Secondary | ICD-10-CM | POA: Diagnosis not present

## 2017-06-13 DIAGNOSIS — N6320 Unspecified lump in the left breast, unspecified quadrant: Secondary | ICD-10-CM | POA: Diagnosis not present

## 2017-06-13 DIAGNOSIS — Z6831 Body mass index (BMI) 31.0-31.9, adult: Secondary | ICD-10-CM | POA: Diagnosis not present

## 2017-06-13 DIAGNOSIS — Z1331 Encounter for screening for depression: Secondary | ICD-10-CM | POA: Diagnosis not present

## 2017-06-13 DIAGNOSIS — Z1339 Encounter for screening examination for other mental health and behavioral disorders: Secondary | ICD-10-CM | POA: Diagnosis not present

## 2017-06-13 DIAGNOSIS — Z79899 Other long term (current) drug therapy: Secondary | ICD-10-CM | POA: Diagnosis not present

## 2017-06-13 DIAGNOSIS — Z0000 Encounter for general adult medical examination without abnormal findings: Secondary | ICD-10-CM | POA: Diagnosis not present

## 2017-06-13 DIAGNOSIS — Z9181 History of falling: Secondary | ICD-10-CM | POA: Diagnosis not present

## 2017-06-14 DIAGNOSIS — R922 Inconclusive mammogram: Secondary | ICD-10-CM | POA: Diagnosis not present

## 2017-06-14 DIAGNOSIS — N6321 Unspecified lump in the left breast, upper outer quadrant: Secondary | ICD-10-CM | POA: Diagnosis not present

## 2017-07-31 DIAGNOSIS — D485 Neoplasm of uncertain behavior of skin: Secondary | ICD-10-CM | POA: Diagnosis not present

## 2017-07-31 DIAGNOSIS — D225 Melanocytic nevi of trunk: Secondary | ICD-10-CM | POA: Diagnosis not present

## 2017-07-31 DIAGNOSIS — L958 Other vasculitis limited to the skin: Secondary | ICD-10-CM | POA: Diagnosis not present

## 2017-07-31 DIAGNOSIS — D1801 Hemangioma of skin and subcutaneous tissue: Secondary | ICD-10-CM | POA: Diagnosis not present

## 2017-07-31 DIAGNOSIS — D2239 Melanocytic nevi of other parts of face: Secondary | ICD-10-CM | POA: Diagnosis not present

## 2017-08-02 ENCOUNTER — Encounter: Payer: Self-pay | Admitting: Gynecologic Oncology

## 2017-08-02 ENCOUNTER — Inpatient Hospital Stay: Payer: PPO | Attending: Gynecologic Oncology | Admitting: Gynecologic Oncology

## 2017-08-02 VITALS — BP 127/59 | HR 75 | Temp 98.4°F | Resp 20 | Ht 67.0 in | Wt 201.7 lb

## 2017-08-02 DIAGNOSIS — Z90710 Acquired absence of both cervix and uterus: Secondary | ICD-10-CM | POA: Insufficient documentation

## 2017-08-02 DIAGNOSIS — C541 Malignant neoplasm of endometrium: Secondary | ICD-10-CM

## 2017-08-02 DIAGNOSIS — Z90722 Acquired absence of ovaries, bilateral: Secondary | ICD-10-CM | POA: Diagnosis not present

## 2017-08-02 DIAGNOSIS — C786 Secondary malignant neoplasm of retroperitoneum and peritoneum: Secondary | ICD-10-CM | POA: Diagnosis not present

## 2017-08-02 NOTE — Patient Instructions (Signed)
Plan to follow up in three months with Dr. Skeet Latch or sooner if needed.

## 2017-08-02 NOTE — Progress Notes (Signed)
GYN ONC OUTPATIENT VISIT   Erica Moyer 67 y.o. female  Chief Complaint:   Surveillance endometrial cancer  Assessment : Recurrent endometrial carcinoma   Plan: NED since 01/2016 Follow-up with Dr. Skeet Latch in 3 months. Follow-up with Dr. Hinton Rao as scheduled Surveillance imaging scheduled by Dr. Hinton Rao for September 2019   HPI:   Oncology History   Erica Moyer reports having an episode of vaginal spotting in May 2016. She initially thought this was due to urethral irritation as she has bladder prolapse. She then continued to spot through the summer and fall of 2016 and was evaluated by Dr. Marvel Plan on 01/19/2015 at which time a transvaginal ultrasound scan was performed which revealed a normal size uterus measuring 7.4 x 8.4 x 3.7 cm with a thickened endometrial stripe 15 mm. The ovaries were grossly normal. She then underwent a endometrial biopsy on 01/28/2015 which revealed FIGO grade 2 moderately differentiated endometrioid adenocarcinoma     Endometrial cancer ([place])   01/28/2015 Initial Diagnosis    Endometrial cancer ([place]) FIGO grade 2 moderately differentiated endometrioid adenocarcinoma      02/25/2015 Surgery    robotic laparoscopic hysterectomy bilateral salpingo-oophorectomy and sentinel lymph nodes on       02/25/2015 Pathology Results     grade 3 endometrial cancer measuring 2.4 cm in diameter with superficial myometrial invasion. There was focal lymphatic vascular involvement. Lymph nodes and adnexa were negative for any metastatic disease.  Stage IA        - 06/11/2015 Radiation Therapy    Vaginal cuff brachytherapy      08/2015 Relapse/Recurrence     She was seen by Dr. Sofie Hartigan in mid July and at that time complained of increasing pelvic and lower back pain. CT scan was obtained on July 26 demonstrated what appears to be intraperitoneal disease including a left-sided metastatic site measuring 4.6 x 3.4 x 3 cm causing mass effect on the distal third  of the left ureter and moderate proximal hydronephrosis and has some mass effect on the sigmoid colon. There were 2 implants in the right hemipelvis measuring 3 x 2.2 and 2.9 x 2.3 cm.   CT Guided biopsy c/w recurrent endometrial cancer.       08/2015 - 01/15/2016 Chemotherapy    Received adjuvant taxol/carboplatin x 6      02/10/2016 Imaging    Imaging 02/10/2016 without evidence of metastatic carcinoma      05/08/2016 Imaging    Imaging 05/08/2016 without evidence of recurrent disease.      11/02/2016 Imaging    No evidence of metastatic disease.  Bilateral inguinal hernias containing fat      04/27/2017 Imaging    CT chest abdomen and pelvis No findings worrisome for metastatic disease in the chest abdomen or pelvis.  There is small pulmonary nodules bilaterally which are predominantly subpleural or perifissural all likely lymph nodes some of these were not previously imaged suggest attention on routine follow-up      05/01/2017 Miscellaneous    CA125    8.8       Review of Systems: No fever or chills, No nausea, Good appetite, no diarrhea or constipation. Reports weight gain.  No vaginal bleeding or discharge no changes in bowel or bladder habits no edema in the extremities no cough shortness of breath, no flank pain otherwise non contributary ROS  Vitals: Blood pressure (!) 127/59, pulse 75, temperature 98.4 F (36.9 C), temperature source Oral, resp. rate 20, height 5\' 7"  (1.702 m), weight  201 lb 11.2 oz (91.5 kg), SpO2 99 %.  Physical Exam: General : The patient is a healthy woman in no acute distress, HEENT: normocephalic, extraoccular movements normal; Sclera nonicteric  Lymph nodes: Supraclavicular and inguinal nodes not enlarged  Abdomen: Soft, non-tender, no ascites, no organomegally, no masses, no hernias  Pelvic:  EGBUS: Normal female  Vagina: Normal, no lesions , No discharge, tenderness or masses. Atrophic anterior and posterior vaginal wall prolapse Urethra and  Bladder: Normal Cervix: Surgically absent  Uterus: Surgically absent  Lower extremities: No edema or varicosities. Normal range of motion  Rectal: Good tone no masses no rectovaginal septum nodularity   Past Medical History:  Diagnosis Date  . Cancer [facility])    endometrial cancer  . Complication of anesthesia    slow to wake up  . Frequency of urination   . GERD (gastroesophageal reflux disease)   . Headache    hx migraines  . Hyperlipidemia   . Osteopenia   . PONV (postoperative nausea and vomiting)   . Radiation 05/19/15-06/11/15   vaginal cuff 30 Gy  . Urgency incontinence     Past Surgical History:  Procedure Laterality Date  . CHOLECYSTECTOMY    . ROBOTIC ASSISTED TOTAL HYSTERECTOMY WITH BILATERAL SALPINGO OOPHERECTOMY Bilateral 02/25/2015   Procedure: XI ROBOTIC ASSISTED TOTAL HYSTERECTOMY WITH BILATERAL SALPINGO OOPHORECTOMY WITH SENTINAL LYMPH NODE MAPPING;  Surgeon: [name], MD;  Location: [facility];  Service: Gynecology;  Laterality: Bilateral;  . TUBAL LIGATION     1986    Current Outpatient Medications  Medication Sig Dispense Refill  . Calcium Carb-Cholecalciferol (CALTRATE 600+D3 SOFT PO) Take 1 tablet by mouth daily.    . calcium carbonate (TUMS - DOSED IN MG ELEMENTAL CALCIUM) 500 MG chewable tablet Chew 1 tablet by mouth 3 (three) times daily as needed for indigestion or heartburn. Reported on 05/19/2015    . gabapentin (NEURONTIN) 300 MG capsule Take 300 mg by mouth 3 (three) times daily.    Marland Kitchen ibuprofen (ADVIL,MOTRIN) 200 MG tablet Take 200 mg by mouth every 6 (six) hours as needed (pain). Reported on 04/15/2015    . mometasone (NASONEX) 50 MCG/ACT nasal spray Place 2 sprays into the nose daily as needed.    . pyridOXINE (VITAMIN B-6) 100 MG tablet Take 100 mg by mouth daily.    . ranitidine (ZANTAC) 150 MG tablet Take 150 mg by mouth at bedtime.    . rosuvastatin (CRESTOR) 10 MG tablet Take 10 mg by mouth at bedtime.   3   No current facility-administered  medications for this visit.     Social History   Socioeconomic History  . Marital status: Married    Spouse name: Not on file  . Number of children: 1  . Years of education: Not on file  . Highest education level: Not on file  Occupational History  . Occupation: retired Tour manager  . Financial resource strain: Not on file  . Food insecurity:    Worry: Not on file    Inability: Not on file  . Transportation needs:    Medical: Not on file    Non-medical: Not on file  Tobacco Use  . Smoking status: Never Smoker  . Smokeless tobacco: Never Used  Substance and Sexual Activity  . Alcohol use: No  . Drug use: No  . Sexual activity: Not on file  Lifestyle  . Physical activity:    Days per week: Not on file    Minutes per session: Not on  file  . Stress: Not on file  Relationships  . Social connections:    Talks on phone: Not on file    Gets together: Not on file    Attends religious service: Not on file    Active member of club or organization: Not on file    Attends meetings of clubs or organizations: Not on file    Relationship status: Not on file  . Intimate partner violence:    Fear of current or ex partner: Not on file    Emotionally abused: Not on file    Physically abused: Not on file    Forced sexual activity: Not on file  Other Topics Concern  . Not on file  Social History Narrative  . Not on file   Provides care for her granddaughters on the weekend while her daughter works.  This is a source of significant personal and social satisfaction.  The granddaughters have decided that they would each like a weeknight individually with the grandparents.  Family History  Problem Relation Age of Onset  . Anesthesia problems Mother   . Hypertension Mother   . Hypertension Father   . Stroke Father   . Cancer Paternal Grandmother        not sure what type

## 2017-08-17 IMAGING — CT CT ABD-PELV W/ CM
2 of 5 series · 14 of 46 positions shown, 16 images · IV contrast (ISOVUE)
Comparison: No priors.

CLINICAL DATA: 64-year-old female with history of endometrial
carcinoma diagnosed in January 2015, status post TAH/BSO.
Chemotherapy completed in May 2015. Lower abdominal pain for 1
month. Constipation and bloating.

EXAM:
CT ABDOMEN AND PELVIS WITH CONTRAST
TECHNIQUE: Multidetector CT imaging of the abdomen and pelvis was performed
using the standard protocol following bolus administration of
intravenous contrast.
CONTRAST:  100mL LJ0ZRL-UZZ IOPAMIDOL (LJ0ZRL-UZZ) INJECTION 61%

[Series 2: abd/pel with · axial · 0.74mm/px · z∈[+1152,+1577]mm · 11 of 99 slices shown, 13 images]
[im 7/99  soft-tissue]
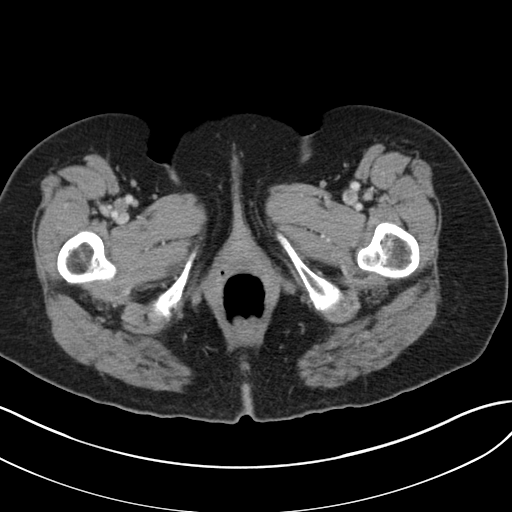
[im 7/99  bone]
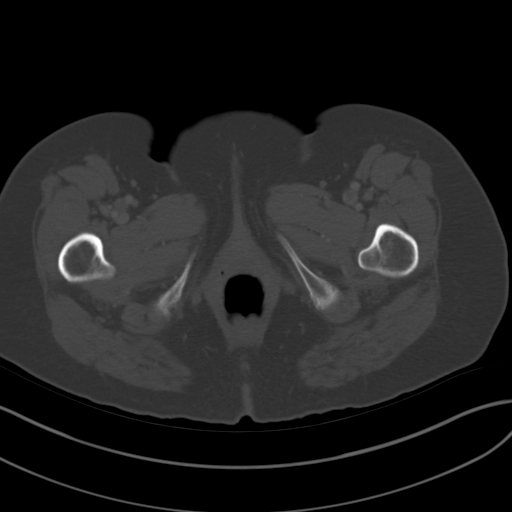
[im 14/99  soft-tissue]
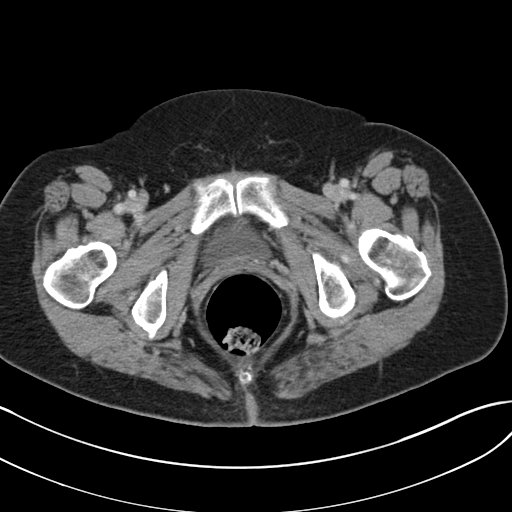
[im 27/99  soft-tissue]
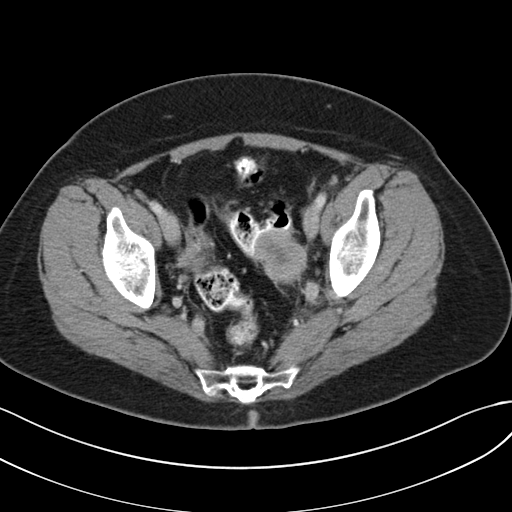
[im 33/99  soft-tissue]
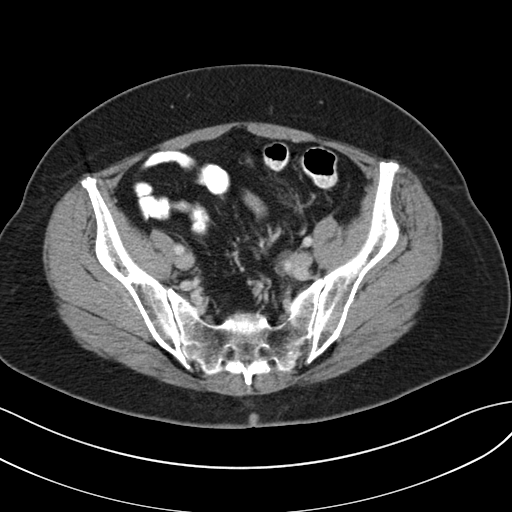
[im 40/99  soft-tissue]
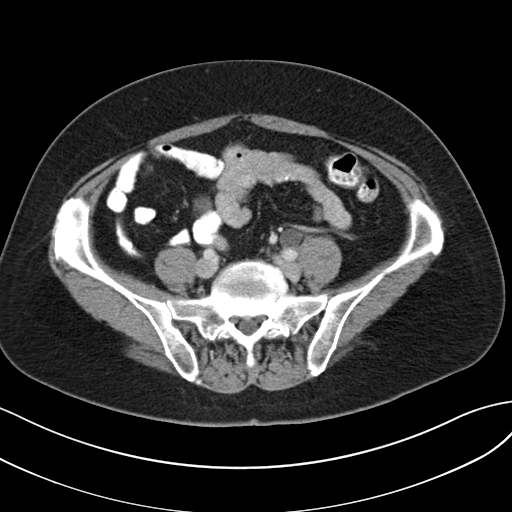
[im 53/99  soft-tissue]
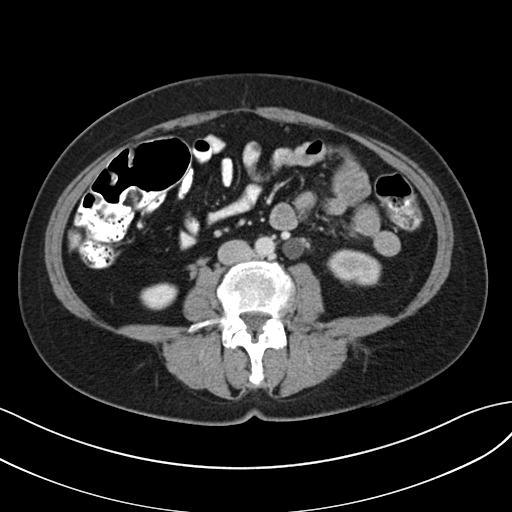
[im 59/99  soft-tissue]
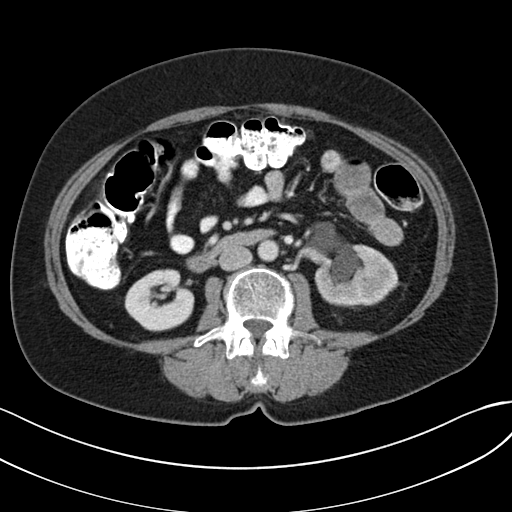
[im 66/99  soft-tissue]
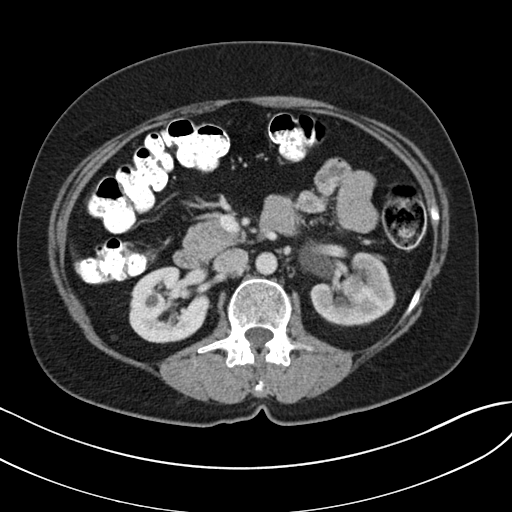
[im 72/99  soft-tissue]
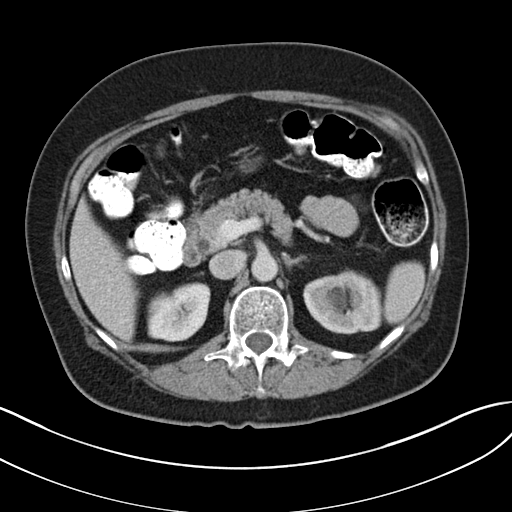
[im 72/99  bone]
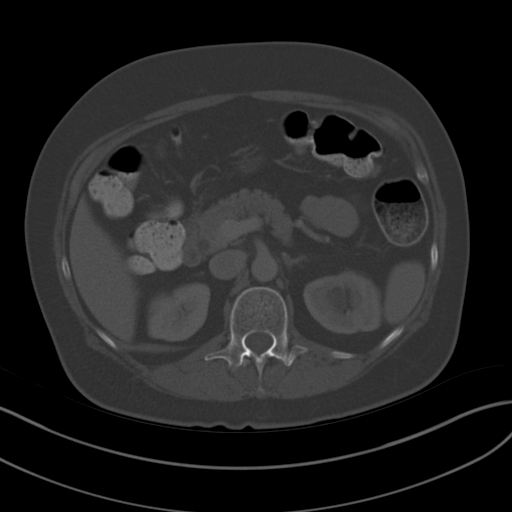
[im 85/99  soft-tissue]
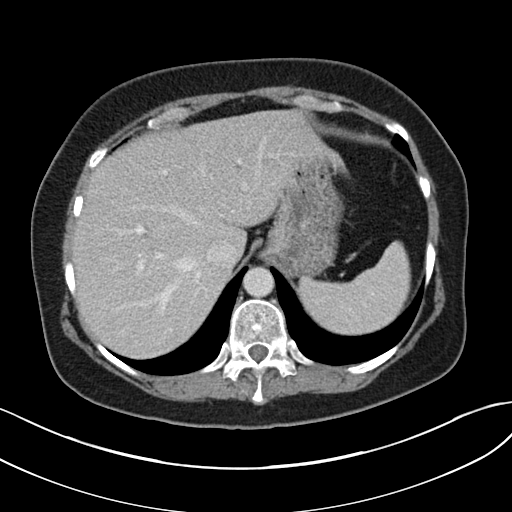
[im 92/99  soft-tissue]
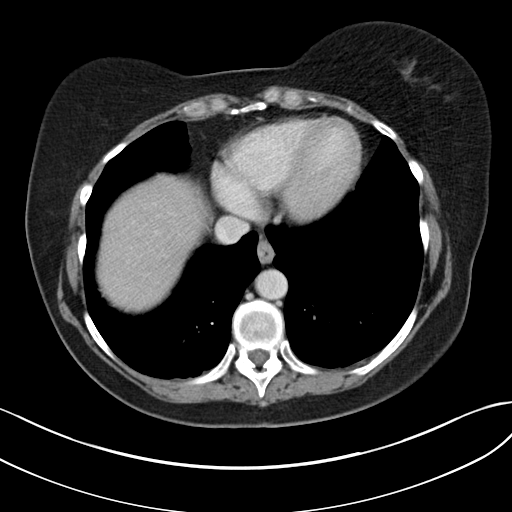

[Series 5: coronal a/|p · coronal · 0.76mm/px · 3 of 142 slices shown]
[im 48/142  soft-tissue]
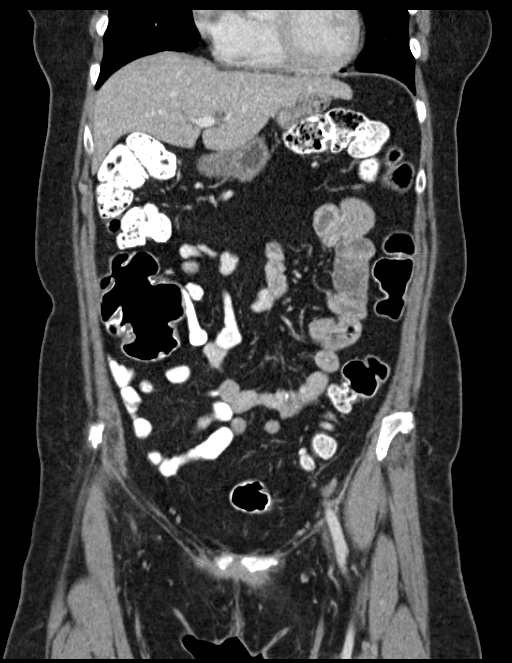
[im 63/142  soft-tissue]
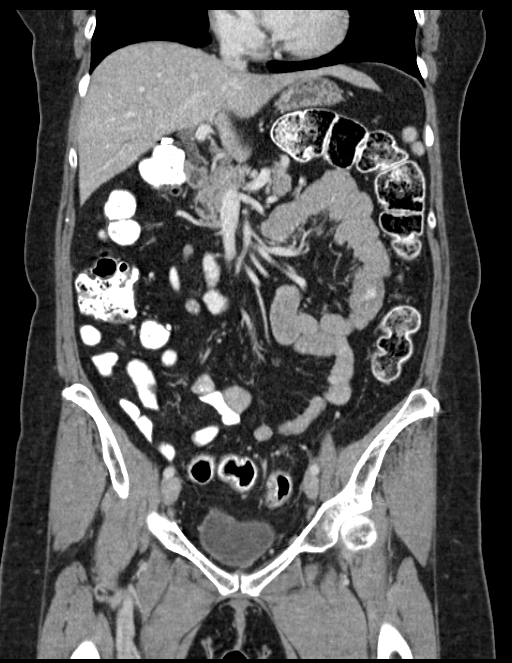
[im 79/142  soft-tissue]
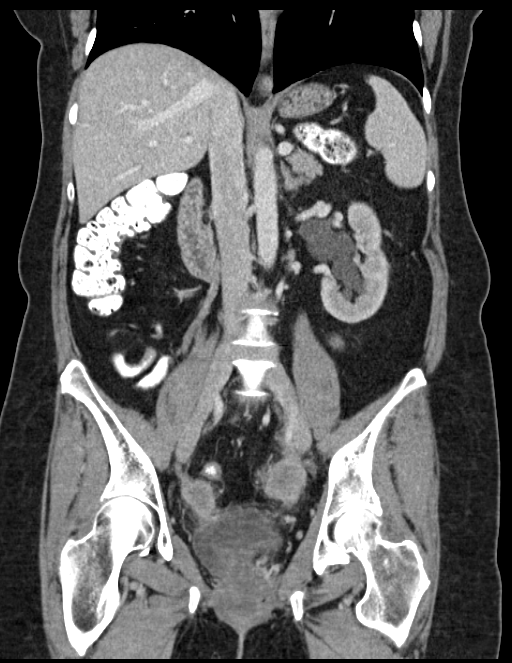

[14 of 46 positions shown; findings below may reference images not displayed]

FINDINGS: Lower chest: 4 mm pulmonary nodule in the left lower lobe
posteriorly (image 17 of series 4).

Hepatobiliary: No suspicious cystic or solid hepatic lesions. Status
post cholecystectomy. Minimal intrahepatic biliary ductal
dilatation, and the common bile duct measures 10 mm in the porta
hepatis; imaging findings favored to reflect benign post
cholecystectomy physiology.

Pancreas: No pancreatic mass. No pancreatic ductal dilatation. No
pancreatic or peripancreatic fluid or inflammatory changes.

Spleen: Unremarkable.

Adrenals/Urinary Tract: Multiple sub cm low-attenuation lesions in
the right kidney are too small to definitively characterize, but
favored to represent tiny cysts. No focal lesions in the left
kidney. However, there is moderate left-sided hydroureteronephrosis,
which appears related to an obstructing soft tissue lesion exerting
mass effect upon the distal third of the left ureter (see below).
Urinary bladder is normal in appearance.

Stomach/Bowel: Normal appearance of the stomach. No pathologic
dilatation of small bowel or colon. Normal appendix. Peritoneal
implant in the low left hemipelvis exerts mass effect upon the mid
sigmoid colon, narrowing the lumen, without evidence of obstruction.
This appears very intimately associated with the wall of the sigmoid
colon, best appreciated on sagittal image 100 of series 6, such that
early colonic invasion from this peritoneal implant is not excluded.

Vascular/Lymphatic: Minimal atherosclerosis in the pelvic
vasculature, without evidence of aneurysm or dissection. No definite
lymphadenopathy noted in the abdomen or pelvis.

Reproductive: Status post total abdominal hysterectomy and bilateral
salpingo-oophorectomy.

Other: In the pelvis bilaterally there are enhancing soft tissue
nodules, highly concerning for intraperitoneal metastatic lesions.
The largest of these is located on the left side measuring 4.6 x
x 3.0 cm (axial image 73 of series 2 and coronal image 76 of series
5), where it exerts mass effect upon the distal third of the left
ureter causing moderate proximal hydroureteronephrosis, and exerts
mass effect upon the mid sigmoid colon with evidence of potential
early direct colonic invasion. Two smaller implants are also noted
in the right hemipelvis on images 75 and 77 of series 2 measuring
3.0 x 2.2 cm and 2.9 x 2.3 cm respectively. The distal third of the
right ureter comes in close proximity to these lesions, but passes
without obstruction. The more posterior of the lesions (image 77 of
series 2) is intimately associated with the vaginal apex on the
right side. Some degree of direct vaginal invasion is not excluded
(coronal image 87 of series 5). No significant volume of ascites. No
pneumoperitoneum.

Musculoskeletal: There are no aggressive appearing lytic or blastic
lesions noted in the visualized portions of the skeleton.
IMPRESSION: 1. Status post total abdominal hysterectomy and bilateral
salpingo-oophorectomy, with evidence of intraperitoneal metastatic
disease in the low anatomic pelvis, as detailed above. The largest
of the 3 implants noted in the low anatomic pelvis partially
obstructs the distal third of the left ureter resulting in moderate
proximal left hydroureteronephrosis, and also exerts significant
mass effect upon the adjacent mid sigmoid colon with evidence of
potential early direct colonic invasion. One of the lesions in the
right hemipelvis may involve the apex of the vagina on the right
side.
2. Additional incidental findings, as above.
These results will be called to the ordering clinician or
representative by the Radiologist Assistant, and communication
documented in the PACS or zVision Dashboard.

## 2017-11-01 DIAGNOSIS — C55 Malignant neoplasm of uterus, part unspecified: Secondary | ICD-10-CM | POA: Diagnosis not present

## 2017-11-01 DIAGNOSIS — R911 Solitary pulmonary nodule: Secondary | ICD-10-CM | POA: Diagnosis not present

## 2017-11-01 DIAGNOSIS — C541 Malignant neoplasm of endometrium: Secondary | ICD-10-CM | POA: Diagnosis not present

## 2017-11-01 DIAGNOSIS — C786 Secondary malignant neoplasm of retroperitoneum and peritoneum: Secondary | ICD-10-CM | POA: Diagnosis not present

## 2017-11-01 DIAGNOSIS — K449 Diaphragmatic hernia without obstruction or gangrene: Secondary | ICD-10-CM | POA: Diagnosis not present

## 2017-11-01 DIAGNOSIS — I700 Atherosclerosis of aorta: Secondary | ICD-10-CM | POA: Diagnosis not present

## 2017-11-05 DIAGNOSIS — Z8542 Personal history of malignant neoplasm of other parts of uterus: Secondary | ICD-10-CM | POA: Diagnosis not present

## 2017-11-14 NOTE — Progress Notes (Signed)
GYN ONC OUTPATIENT VISIT   Erica Moyer 67 y.o. female  Chief Complaint:   Surveillance endometrial cancer  Assessment : Recurrent endometrial carcinoma   Plan: NED since 01/2016 Follow-up with Dr. Skeet Latch in 6 months. Follow-up with Dr. Hinton Rao as scheduled    HPI:   Oncology History   Elmer reports having an episode of vaginal spotting in May 2016. She initially thought this was due to urethral irritation as she has bladder prolapse. She then continued to spot through the summer and fall of 2016 and was evaluated by Dr. Marvel Plan on 01/19/2015 at which time a transvaginal ultrasound scan was performed which revealed a normal size uterus measuring 7.4 x 8.4 x 3.7 cm with a thickened endometrial stripe 15 mm. The ovaries were grossly normal. She then underwent a endometrial biopsy on 01/28/2015 which revealed FIGO grade 2 moderately differentiated endometrioid adenocarcinoma     Endometrial cancer ([place])   01/28/2015 Initial Diagnosis    Endometrial cancer ([place]) FIGO grade 2 moderately differentiated endometrioid adenocarcinoma    02/25/2015 Surgery    robotic laparoscopic hysterectomy bilateral salpingo-oophorectomy and sentinel lymph nodes on     02/25/2015 Pathology Results     grade 3 endometrial cancer measuring 2.4 cm in diameter with superficial myometrial invasion. There was focal lymphatic vascular involvement. Lymph nodes and adnexa were negative for any metastatic disease.  Stage IA      - 06/11/2015 Radiation Therapy    Vaginal cuff brachytherapy    08/2015 Relapse/Recurrence     She was seen by Dr. Sofie Hartigan in mid July and at that time complained of increasing pelvic and lower back pain. CT scan was obtained on July 26 demonstrated what appears to be intraperitoneal disease including a left-sided metastatic site measuring 4.6 x 3.4 x 3 cm causing mass effect on the distal third of the left ureter and moderate proximal hydronephrosis and has some mass  effect on the sigmoid colon. There were 2 implants in the right hemipelvis measuring 3 x 2.2 and 2.9 x 2.3 cm.   CT Guided biopsy c/w recurrent endometrial cancer.     08/2015 - 01/15/2016 Chemotherapy    Received adjuvant taxol/carboplatin x 6    02/10/2016 Imaging    Imaging 02/10/2016 without evidence of metastatic carcinoma    05/08/2016 Imaging    Imaging 05/08/2016 without evidence of recurrent disease.    11/02/2016 Imaging    No evidence of metastatic disease.  Bilateral inguinal hernias containing fat    04/27/2017 Imaging    CT chest abdomen and pelvis No findings worrisome for metastatic disease in the chest abdomen or pelvis.  There is small pulmonary nodules bilaterally which are predominantly subpleural or perifissural all likely lymph nodes some of these were not previously imaged suggest attention on routine follow-up    05/01/2017 Miscellaneous    CA125    8.8    11/01/2017 Imaging    CT of the chest abdomen and pelvis with contrast Small 4 mm right upper lobe pulmonary nodule likely part of the apical pleural-parenchymal scarring Small type I hiatal hernia aortic atherosclerosis no findings of active or recurrent malignancy     Review of Systems: No fever or chills, No nausea, Good appetite, no diarrhea or constipation. Reports weight gain.  No vaginal bleeding or discharge no changes in bowel or bladder habits no edema in the extremities no cough shortness of breath, no flank pain otherwise non contributary ROS  Vitals: Blood pressure (!) 121/54, pulse 63, temperature  98.4 F (36.9 C), temperature source Oral, resp. rate 18, height 5\' 7"  (1.702 m), weight 198 lb 12.8 oz (90.2 kg).  Physical Exam: General : The patient is a healthy woman in no acute distress, HEENT: normocephalic, extraoccular movements normal; Sclera nonicteric  Lymph nodes: Supraclavicular and inguinal nodes not enlarged  Abdomen: Soft, non-tender, no ascites, no organomegally, no masses, no hernias   Pelvic:  EGBUS: Normal female  Vagina: Normal, no lesions-millimeter sebaceous cyst of the inner labia majora, No discharge, tenderness or masses. Atrophic anterior and posterior vaginal wall prolapse Urethra and Bladder: Normal Cervix: Surgically absent  Uterus: Surgically absent  Lower extremities: No edema or varicosities. Normal range of motion  Rectal: Good tone no masses no rectovaginal septum nodularity   Past Medical History:  Diagnosis Date  . Cancer [facility])    endometrial cancer  . Complication of anesthesia    slow to wake up  . Frequency of urination   . GERD (gastroesophageal reflux disease)   . Headache    hx migraines  . Hyperlipidemia   . Osteopenia   . PONV (postoperative nausea and vomiting)   . Radiation 05/19/15-06/11/15   vaginal cuff 30 Gy  . Urgency incontinence     Past Surgical History:  Procedure Laterality Date  . CHOLECYSTECTOMY    . ROBOTIC ASSISTED TOTAL HYSTERECTOMY WITH BILATERAL SALPINGO OOPHERECTOMY Bilateral 02/25/2015   Procedure: XI ROBOTIC ASSISTED TOTAL HYSTERECTOMY WITH BILATERAL SALPINGO OOPHORECTOMY WITH SENTINAL LYMPH NODE MAPPING;  Surgeon: [name], MD;  Location: [facility];  Service: Gynecology;  Laterality: Bilateral;  . TUBAL LIGATION     1986    Current Outpatient Medications  Medication Sig Dispense Refill  . Calcium Carb-Cholecalciferol (CALTRATE 600+D3 SOFT PO) Take 1 tablet by mouth daily.    . calcium carbonate (TUMS - DOSED IN MG ELEMENTAL CALCIUM) 500 MG chewable tablet Chew 1 tablet by mouth 3 (three) times daily as needed for indigestion or heartburn. Reported on 05/19/2015    . gabapentin (NEURONTIN) 300 MG capsule Take 300 mg by mouth 3 (three) times daily.    Marland Kitchen ibuprofen (ADVIL,MOTRIN) 200 MG tablet Take 200 mg by mouth every 6 (six) hours as needed (pain). Reported on 04/15/2015    . mometasone (NASONEX) 50 MCG/ACT nasal spray Place 2 sprays into the nose daily as needed.    Marland Kitchen OVER THE COUNTER MEDICATION Take 1  tablet by mouth. Multi-vitamin  Women's One A Day    . pyridOXINE (VITAMIN B-6) 100 MG tablet Take 100 mg by mouth daily.    . rosuvastatin (CRESTOR) 10 MG tablet Take 10 mg by mouth at bedtime.   3   No current facility-administered medications for this visit.     Social History   Socioeconomic History  . Marital status: Married    Spouse name: Not on file  . Number of children: 1  . Years of education: Not on file  . Highest education level: Not on file  Occupational History  . Occupation: retired Tour manager  . Financial resource strain: Not on file  . Food insecurity:    Worry: Not on file    Inability: Not on file  . Transportation needs:    Medical: Not on file    Non-medical: Not on file  Tobacco Use  . Smoking status: Never Smoker  . Smokeless tobacco: Never Used  Substance and Sexual Activity  . Alcohol use: No  . Drug use: No  . Sexual activity: Not on file  Lifestyle  . Physical activity:    Days per week: Not on file    Minutes per session: Not on file  . Stress: Not on file  Relationships  . Social connections:    Talks on phone: Not on file    Gets together: Not on file    Attends religious service: Not on file    Active member of club or organization: Not on file    Attends meetings of clubs or organizations: Not on file    Relationship status: Not on file  . Intimate partner violence:    Fear of current or ex partner: Not on file    Emotionally abused: Not on file    Physically abused: Not on file    Forced sexual activity: Not on file  Other Topics Concern  . Not on file  Social History Narrative  . Not on file   Provides care for her granddaughters on the weekend while her daughter works.  This is a source of significant personal and social satisfaction.  Is unable to exercise because of neuropathy and the fact that most of the walking area where she lives is gravel.  Family History  Problem Relation Age of Onset  . Anesthesia  problems Mother   . Hypertension Mother   . Hypertension Father   . Stroke Father   . Cancer Paternal Grandmother        not sure what type

## 2017-11-15 ENCOUNTER — Inpatient Hospital Stay: Payer: PPO | Attending: Gynecologic Oncology | Admitting: Gynecologic Oncology

## 2017-11-15 ENCOUNTER — Encounter: Payer: Self-pay | Admitting: Gynecologic Oncology

## 2017-11-15 VITALS — BP 121/54 | HR 63 | Temp 98.4°F | Resp 18 | Ht 67.0 in | Wt 198.8 lb

## 2017-11-15 DIAGNOSIS — C541 Malignant neoplasm of endometrium: Secondary | ICD-10-CM | POA: Insufficient documentation

## 2017-11-15 DIAGNOSIS — Z90722 Acquired absence of ovaries, bilateral: Secondary | ICD-10-CM

## 2017-11-15 DIAGNOSIS — Z90710 Acquired absence of both cervix and uterus: Secondary | ICD-10-CM | POA: Diagnosis not present

## 2017-11-15 DIAGNOSIS — Z9221 Personal history of antineoplastic chemotherapy: Secondary | ICD-10-CM | POA: Diagnosis not present

## 2017-11-15 DIAGNOSIS — Z923 Personal history of irradiation: Secondary | ICD-10-CM | POA: Diagnosis not present

## 2017-11-15 NOTE — Patient Instructions (Signed)
Plan to follow up in six months or sooner if needed.  Please call [phone] in November or December 2019 to schedule an appointment for April 2020 with Dr. Skeet Latch.  Please call for any needs, concerns, or new symptoms such as vaginal bleeding.

## 2017-12-02 DIAGNOSIS — Z23 Encounter for immunization: Secondary | ICD-10-CM | POA: Diagnosis not present

## 2017-12-27 DIAGNOSIS — Z23 Encounter for immunization: Secondary | ICD-10-CM | POA: Diagnosis not present

## 2018-01-03 ENCOUNTER — Telehealth: Payer: Self-pay | Admitting: *Deleted

## 2018-01-03 NOTE — Telephone Encounter (Signed)
Patient called and scheduled her follow up appt for April

## 2018-01-28 DIAGNOSIS — Z8542 Personal history of malignant neoplasm of other parts of uterus: Secondary | ICD-10-CM | POA: Diagnosis not present

## 2018-01-29 DIAGNOSIS — Z90722 Acquired absence of ovaries, bilateral: Secondary | ICD-10-CM

## 2018-01-29 DIAGNOSIS — D72819 Decreased white blood cell count, unspecified: Secondary | ICD-10-CM | POA: Diagnosis not present

## 2018-01-29 DIAGNOSIS — Z923 Personal history of irradiation: Secondary | ICD-10-CM | POA: Diagnosis not present

## 2018-01-29 DIAGNOSIS — M8589 Other specified disorders of bone density and structure, multiple sites: Secondary | ICD-10-CM | POA: Diagnosis not present

## 2018-01-29 DIAGNOSIS — M8580 Other specified disorders of bone density and structure, unspecified site: Secondary | ICD-10-CM | POA: Diagnosis not present

## 2018-01-29 DIAGNOSIS — Z9221 Personal history of antineoplastic chemotherapy: Secondary | ICD-10-CM

## 2018-01-29 DIAGNOSIS — Z90710 Acquired absence of both cervix and uterus: Secondary | ICD-10-CM | POA: Diagnosis not present

## 2018-01-29 DIAGNOSIS — C541 Malignant neoplasm of endometrium: Secondary | ICD-10-CM | POA: Diagnosis not present

## 2018-01-29 DIAGNOSIS — C55 Malignant neoplasm of uterus, part unspecified: Secondary | ICD-10-CM | POA: Diagnosis not present

## 2018-01-29 DIAGNOSIS — C786 Secondary malignant neoplasm of retroperitoneum and peritoneum: Secondary | ICD-10-CM | POA: Diagnosis not present

## 2018-01-31 DIAGNOSIS — L821 Other seborrheic keratosis: Secondary | ICD-10-CM | POA: Diagnosis not present

## 2018-01-31 DIAGNOSIS — D485 Neoplasm of uncertain behavior of skin: Secondary | ICD-10-CM | POA: Diagnosis not present

## 2018-01-31 DIAGNOSIS — D225 Melanocytic nevi of trunk: Secondary | ICD-10-CM | POA: Diagnosis not present

## 2018-01-31 DIAGNOSIS — L814 Other melanin hyperpigmentation: Secondary | ICD-10-CM | POA: Diagnosis not present

## 2018-01-31 DIAGNOSIS — D2239 Melanocytic nevi of other parts of face: Secondary | ICD-10-CM | POA: Diagnosis not present

## 2018-03-05 DIAGNOSIS — Z1231 Encounter for screening mammogram for malignant neoplasm of breast: Secondary | ICD-10-CM | POA: Diagnosis not present

## 2018-03-11 ENCOUNTER — Telehealth: Payer: Self-pay

## 2018-03-11 NOTE — Telephone Encounter (Signed)
Outgoing call to pt to return her call.  She wanted to confirm her upcoming appt with Dr Skeet Latch on April 23 at 10:45 am. No other needs per pt at this time.

## 2018-04-29 DIAGNOSIS — Z8542 Personal history of malignant neoplasm of other parts of uterus: Secondary | ICD-10-CM | POA: Diagnosis not present

## 2018-04-29 DIAGNOSIS — C541 Malignant neoplasm of endometrium: Secondary | ICD-10-CM | POA: Diagnosis not present

## 2018-04-29 DIAGNOSIS — R978 Other abnormal tumor markers: Secondary | ICD-10-CM | POA: Diagnosis not present

## 2018-05-17 ENCOUNTER — Telehealth: Payer: Self-pay | Admitting: *Deleted

## 2018-05-17 NOTE — Telephone Encounter (Signed)
Called and spoke with the patient regarding her appt on 4/23. Appt moved to 5/28 due to hospital policy to move all routine appts out several due to COVID-19

## 2018-05-28 ENCOUNTER — Other Ambulatory Visit: Payer: Self-pay

## 2018-05-28 ENCOUNTER — Telehealth (INDEPENDENT_AMBULATORY_CARE_PROVIDER_SITE_OTHER): Payer: PPO | Admitting: Gastroenterology

## 2018-05-28 ENCOUNTER — Encounter: Payer: Self-pay | Admitting: Gastroenterology

## 2018-05-28 VITALS — Ht 67.0 in | Wt 200.0 lb

## 2018-05-28 DIAGNOSIS — K219 Gastro-esophageal reflux disease without esophagitis: Secondary | ICD-10-CM | POA: Diagnosis not present

## 2018-05-28 DIAGNOSIS — C541 Malignant neoplasm of endometrium: Secondary | ICD-10-CM

## 2018-05-28 MED ORDER — PANTOPRAZOLE SODIUM 40 MG PO TBEC: 40.0000 mg | DELAYED_RELEASE_TABLET | Freq: Every day | ORAL | 11 refills | Status: DC

## 2018-05-28 NOTE — Patient Instructions (Addendum)
Protonix 40 mg p.o. QD, 1/2 hr before breakfast. #30.  11 refills.  EGD with esophageal biopsies and possibly dilatation in 2 weeks at [facility].  Discussed risks and benefits including small but definite risks of esophageal perforation.  Patient will call back mid May to schedule when she is sure she will be able to have Dr Lyndel Safe preform the procedure    I have instructed patient to chew foods especially meats and breads well and eat slowly.  Thank You for choosing [facility]

## 2018-05-28 NOTE — Progress Notes (Signed)
Chief Complaint: Dysphagia  Referring Provider:  [name], MD      ASSESSMENT AND PLAN;    #1. GERD with esophageal dysphagia. D/d includes esophageal stricture, Schatzki's ring, motility disorder, eosinophilic esophagitis, pill induced esophagitis, rule out esophageal carcinoma or extrinsic lesions.   #2.  Endometrial adenocarcinoma s/p robotic TAH with BSO 02/2015 followed by RT with recurrence of peritoneum 08/2015 treated with 6 cycles of carboplatin/paclitaxel neg CT abdo/pelvis 04/2018. Neg colon 09/2013  Plan: -Protonix 40 mg p.o. QD, 1/2 hr before breakfast. #30.  11 refills. -EGD with esophageal biopsies and possibly dilatation in 2 weeks at [facility].  Discussed risks and benefits including small but definite risks of esophageal perforation.  - I have instructed patient to chew foods especially meats and breads well and eat slowly.   HPI:    Erica Moyer is a 68 y.o. female  [name] Hamilton's mother With solid food dysphagia x 3 to 4 months, intermittent, mid chest, has to occasionally wash with liquids. Has mucus mostly in the morning-every day. Has been having occasional heartburn.  Did not tolerate Nexium in the past due to abdominal bloating.  Has stopped drinking coffee, chocolates.  Does admit that Crestor has always made her reflux worse.  She has been taking it at night. Was very much concerned with esophageal neoplasms.  No significant weight loss.  Alt diarrhea and constipation, Dx with IBS in 2005 No melena or hematochezia.  Review of labs from 04/29/2018-hemoglobin 13.7, MCV 88, platelets 144, WBC count 4.7, CA 125, CEA 0.6.  Normal CMP with BUN/creatinine 13/0.7.  Normal LFTs.  Magnesium 1.9.  Past GI procedures: -Colonoscopy 09/2013 ([facility])- neg. -CT Abdo/pelvis 04/29/2018 at [facility] negative for any recurrence.  Normal liver, status post cholecystectomy, normal pancreas, spleen mild scarring in the right upper kidney.  Otherwise unremarkable.  Past Medical History:  Diagnosis Date  . Cancer [facility])    endometrial cancer  . Complication of anesthesia    slow to wake up  . Frequency of urination   . GERD (gastroesophageal reflux disease)   . Headache    hx migraines  . Hyperlipidemia   . Osteopenia   . PONV (postoperative nausea and vomiting)   . Radiation 05/19/15-06/11/15   vaginal cuff 30 Gy  . Urgency incontinence     Past Surgical History:  Procedure Laterality Date  . CHOLECYSTECTOMY    . ROBOTIC ASSISTED TOTAL HYSTERECTOMY WITH BILATERAL SALPINGO OOPHERECTOMY Bilateral 02/25/2015   Procedure: XI ROBOTIC ASSISTED TOTAL HYSTERECTOMY WITH BILATERAL SALPINGO OOPHORECTOMY WITH SENTINAL LYMPH NODE MAPPING;  Surgeon: [name], MD;  Location: [facility];  Service: Gynecology;  Laterality: Bilateral;  . TUBAL LIGATION     1986    Family History  Problem Relation Age of Onset  . Anesthesia problems Mother   . Hypertension Mother   . Hypertension Father   . Stroke Father   . Cancer Paternal Grandmother        not sure what type    Social History   Tobacco Use  . Smoking status: Never Smoker  . Smokeless tobacco: Never Used  Substance Use Topics  . Alcohol use: No  . Drug use: No    Current Outpatient Medications  Medication Sig Dispense Refill  . Calcium Carb-Cholecalciferol (CALTRATE 600+D3 SOFT PO) Take 1 tablet by mouth 2 (two) times daily.     . calcium carbonate (TUMS - DOSED IN MG ELEMENTAL CALCIUM) 500 MG chewable tablet Chew 1 tablet by mouth 3 (  three) times daily as needed for indigestion or heartburn. Reported on 05/19/2015    . cetirizine (ZYRTEC) 10 MG tablet Take 10 mg by mouth daily.    Marland Kitchen gabapentin (NEURONTIN) 300 MG capsule Take 300 mg by mouth 3 (three) times daily.    Marland Kitchen ibuprofen (ADVIL,MOTRIN) 200 MG tablet Take 200 mg by mouth every 6 (six) hours as needed (pain). Reported on 04/15/2015    . mometasone (NASONEX) 50 MCG/ACT nasal spray Place 2 sprays into the nose daily as needed.    . Multiple  Vitamin (MULTIVITAMIN) tablet Take 1 tablet by mouth daily. Women's one a day vitamin    . OVER THE COUNTER MEDICATION Vitamin B6 100 mg tab daily    . OVER THE COUNTER MEDICATION Vita fusion Beautiful Hair with biotin 2500 mcg one gummie a day    . rosuvastatin (CRESTOR) 10 MG tablet Take 10 mg by mouth at bedtime.   3   No current facility-administered medications for this visit.     No Known Allergies  Review of Systems:  Constitutional: Denies fever, chills, diaphoresis, appetite change and fatigue.  HEENT: Denies photophobia, eye pain, redness, hearing loss, ear pain, congestion, sore throat, rhinorrhea, sneezing, mouth sores, neck pain, neck stiffness and tinnitus.   Respiratory: Denies SOB, DOE, cough, chest tightness,  and wheezing.   Cardiovascular: Denies chest pain, palpitations and leg swelling.  Genitourinary: Denies dysuria, urgency, frequency, hematuria, flank pain and difficulty urinating.  Musculoskeletal: Denies myalgias, back pain, joint swelling, arthralgias and gait problem.  Skin: No rash.  Neurological: Denies dizziness, seizures, syncope, weakness, light-headedness, numbness and headaches.  Hematological: Denies adenopathy. Easy bruising, personal or family bleeding history  Psychiatric/Behavioral: has anxiety or depression     Physical Exam:    Ht 5\' 7"  (1.702 m)   Wt 200 lb (90.7 kg)   BMI 31.32 kg/m  Filed Weights   05/28/18 0809  Weight: 200 lb (90.7 kg)   Not examined since it was a tele-visit  Data Reviewed: I have personally reviewed following labs and imaging studies  CBC: CBC Latest Ref Rng & Units 09/16/2015 08/25/2015 02/26/2015  WBC 4.0 - 10.5 K/uL 8.0 6.2 8.9  Hemoglobin 12.0 - 15.0 g/dL 13.6 13.1 12.5  Hematocrit 36.0 - 46.0 % 40.2 39.6 39.3  Platelets 150 - 400 K/uL 211 176 187    CMP: CMP Latest Ref Rng & Units 08/25/2015 02/26/2015 02/23/2015  Glucose 70 - 140 mg/dl 90 103(H) 103(H)  BUN 7.0 - 26.0 mg/dL 15.1 10 13   Creatinine 0.6 -  1.1 mg/dL 0.8 0.78 0.69  Sodium 136 - 145 mEq/L 143 144 144  Potassium 3.5 - 5.1 mEq/L 3.9 4.2 4.6  Chloride 101 - 111 mmol/L - 110 110  CO2 22 - 29 mEq/L 24 24 27   Calcium 8.4 - 10.4 mg/dL 9.6 9.1 9.8  Total Protein 6.5 - 8.1 g/dL - - 7.6  Total Bilirubin 0.3 - 1.2 mg/dL - - 0.9  Alkaline Phos 38 - 126 U/L - - 54  AST 15 - 41 U/L - - 22  ALT 14 - 54 U/L - - 23   This service was provided via telemedicine.  The patient was located at home.  The provider was located in office.  The patient did consent to this telephone visit and is aware of possible charges through their insurance for this visit.  The patient was referred by Dr. Helene Kelp.   Time spent on call and coordination of care: 45 min  Carmell Austria, MD 05/28/2018, 9:11 AM  Cc: [name], MD

## 2018-06-06 ENCOUNTER — Ambulatory Visit: Payer: PPO | Admitting: Gynecologic Oncology

## 2018-06-27 ENCOUNTER — Ambulatory Visit: Payer: PPO | Admitting: Gynecologic Oncology

## 2018-07-11 ENCOUNTER — Inpatient Hospital Stay: Payer: PPO | Attending: Gynecologic Oncology | Admitting: Gynecologic Oncology

## 2018-07-11 ENCOUNTER — Other Ambulatory Visit: Payer: Self-pay

## 2018-07-11 ENCOUNTER — Encounter: Payer: Self-pay | Admitting: Gynecologic Oncology

## 2018-07-11 VITALS — BP 128/46 | HR 68 | Temp 98.7°F | Resp 19 | Ht 67.0 in | Wt 203.1 lb

## 2018-07-11 DIAGNOSIS — Z08 Encounter for follow-up examination after completed treatment for malignant neoplasm: Secondary | ICD-10-CM | POA: Diagnosis not present

## 2018-07-11 DIAGNOSIS — Z9221 Personal history of antineoplastic chemotherapy: Secondary | ICD-10-CM | POA: Diagnosis not present

## 2018-07-11 DIAGNOSIS — C541 Malignant neoplasm of endometrium: Secondary | ICD-10-CM

## 2018-07-11 DIAGNOSIS — Z90710 Acquired absence of both cervix and uterus: Secondary | ICD-10-CM | POA: Insufficient documentation

## 2018-07-11 DIAGNOSIS — Z923 Personal history of irradiation: Secondary | ICD-10-CM | POA: Insufficient documentation

## 2018-07-11 DIAGNOSIS — Z8542 Personal history of malignant neoplasm of other parts of uterus: Secondary | ICD-10-CM | POA: Insufficient documentation

## 2018-07-11 DIAGNOSIS — Z90722 Acquired absence of ovaries, bilateral: Secondary | ICD-10-CM | POA: Diagnosis not present

## 2018-07-11 NOTE — Patient Instructions (Signed)
Follow up with Dr. Skeet Latch in November 2020. Call the office  In September to schedule your appointment. Office number is [phone].

## 2018-07-11 NOTE — Progress Notes (Signed)
GYN ONC OUTPATIENT VISIT   Erica Moyer 68 y.o. female  Chief Complaint:   Surveillance endometrial cancer  Assessment : Recurrent endometrial carcinoma   Plan: NED since 01/2016 Follow-up with Dr. Skeet Latch in 6 months. Follow-up with Dr. Hinton Rao as scheduled  F/U with PCP reg immunizations    HPI:   Oncology History   Erica Moyer reports having an episode of vaginal spotting in May 2016. She initially thought this was due to urethral irritation as she has bladder prolapse. She then continued to spot through the summer and fall of 2016 and was evaluated by Dr. Marvel Plan on 01/19/2015 at which time a transvaginal ultrasound scan was performed which revealed a normal size uterus measuring 7.4 x 8.4 x 3.7 cm with a thickened endometrial stripe 15 mm. The ovaries were grossly normal. She then underwent a endometrial biopsy on 01/28/2015 which revealed FIGO grade 2 moderately differentiated endometrioid adenocarcinoma     Endometrial cancer ([place])   01/28/2015 Initial Diagnosis    Endometrial cancer ([place]) FIGO grade 2 moderately differentiated endometrioid adenocarcinoma    02/25/2015 Surgery    robotic laparoscopic hysterectomy bilateral salpingo-oophorectomy and sentinel lymph nodes on     02/25/2015 Pathology Results     grade 3 endometrial cancer measuring 2.4 cm in diameter with superficial myometrial invasion. There was focal lymphatic vascular involvement. Lymph nodes and adnexa were negative for any metastatic disease.  Stage IA      - 06/11/2015 Radiation Therapy    Vaginal cuff brachytherapy    08/2015 Relapse/Recurrence     She was seen by Dr. Sofie Hartigan in mid July and at that time complained of increasing pelvic and lower back pain. CT scan was obtained on July 26 demonstrated what appears to be intraperitoneal disease including a left-sided metastatic site measuring 4.6 x 3.4 x 3 cm causing mass effect on the distal third of the left ureter and moderate proximal  hydronephrosis and has some mass effect on the sigmoid colon. There were 2 implants in the right hemipelvis measuring 3 x 2.2 and 2.9 x 2.3 cm.   CT Guided biopsy c/w recurrent endometrial cancer.     08/2015 - 01/15/2016 Chemotherapy    Received adjuvant taxol/carboplatin x 6    02/10/2016 Imaging    Imaging 02/10/2016 without evidence of metastatic carcinoma    05/08/2016 Imaging    Imaging 05/08/2016 without evidence of recurrent disease.    11/02/2016 Imaging    No evidence of metastatic disease.  Bilateral inguinal hernias containing fat    04/27/2017 Imaging    CT chest abdomen and pelvis No findings worrisome for metastatic disease in the chest abdomen or pelvis.  There is small pulmonary nodules bilaterally which are predominantly subpleural or perifissural all likely lymph nodes some of these were not previously imaged suggest attention on routine follow-up    05/01/2017 Miscellaneous    CA125    8.8    11/01/2017 Imaging    CT of the chest abdomen and pelvis with contrast Small 4 mm right upper lobe pulmonary nodule likely part of the apical pleural-parenchymal scarring Small type I hiatal hernia aortic atherosclerosis no findings of active or recurrent malignancy     Review of Systems: No fever or chills, No nausea, Good appetite, no diarrhea or constipation. Reports weight gain.  No vaginal bleeding or discharge no changes in bowel or bladder habits no edema in the extremities no cough shortness of breath, no flank pain otherwise non contributary ROS  Vitals: Blood  pressure (!) 128/46, pulse 68, temperature 98.7 F (37.1 C), temperature source Oral, resp. rate 19, height 5\' 7"  (1.702 m), weight 203 lb 1.6 oz (92.1 kg), SpO2 98 %.  Wt Readings from Last 3 Encounters:  07/11/18 203 lb 1.6 oz (92.1 kg)  05/28/18 200 lb (90.7 kg)  11/15/17 198 lb 12.8 oz (90.2 kg)     Physical Exam: General : The patient is a healthy woman in no acute distress, HEENT: normocephalic,  extraoccular movements normal; Sclera nonicteric  Lymph nodes: Supraclavicular and inguinal nodes not enlarged  Abdomen: Soft, non-tender,obese,  no ascites, no organomegally, no masses, no hernias  Pelvic:  EGBUS: Normal female  Vagina: Normal, No discharge, tenderness or masses. Atrophic anterior and posterior vaginal wall prolapse Lower extremities: No edema or varicosities. Normal range of motion  Rectal: Good tone no masses no rectovaginal septum nodularity   Past Medical History:  Diagnosis Date  . Cancer [facility])    endometrial cancer  . Complication of anesthesia    slow to wake up  . Frequency of urination   . GERD (gastroesophageal reflux disease)   . Headache    hx migraines  . Hyperlipidemia   . Osteopenia   . PONV (postoperative nausea and vomiting)   . Radiation 05/19/15-06/11/15   vaginal cuff 30 Gy  . Urgency incontinence     Past Surgical History:  Procedure Laterality Date  . CHOLECYSTECTOMY    . ROBOTIC ASSISTED TOTAL HYSTERECTOMY WITH BILATERAL SALPINGO OOPHERECTOMY Bilateral 02/25/2015   Procedure: XI ROBOTIC ASSISTED TOTAL HYSTERECTOMY WITH BILATERAL SALPINGO OOPHORECTOMY WITH SENTINAL LYMPH NODE MAPPING;  Surgeon: [name], MD;  Location: [facility];  Service: Gynecology;  Laterality: Bilateral;  . TUBAL LIGATION     1986    Current Outpatient Medications  Medication Sig Dispense Refill  . calcium carbonate (TUMS - DOSED IN MG ELEMENTAL CALCIUM) 500 MG chewable tablet Chew 1 tablet by mouth 3 (three) times daily as needed for indigestion or heartburn. Reported on 05/19/2015    . Calcium Carbonate-Vit D-Min (CALCIUM 600+D PLUS MINERALS) 600-400 MG-UNIT CHEW Chew 1 tablet by mouth 2 (two) times a day.    . cetirizine (ZYRTEC) 10 MG tablet Take 10 mg by mouth daily.    Marland Kitchen gabapentin (NEURONTIN) 300 MG capsule Take 300 mg by mouth 3 (three) times daily.    Marland Kitchen ibuprofen (ADVIL,MOTRIN) 200 MG tablet Take 200 mg by mouth every 6 (six) hours as needed (pain). Reported  on 04/15/2015    . mometasone (NASONEX) 50 MCG/ACT nasal spray Place 2 sprays into the nose daily as needed.    . Multiple Vitamin (MULTIVITAMIN) tablet Take 1 tablet by mouth daily. Women's one a day vitamin    . OVER THE COUNTER MEDICATION Vitamin B6 100 mg tab daily    . OVER THE COUNTER MEDICATION Vita fusion Beautiful Hair with biotin 2500 mcg one gummie a day    . pantoprazole (PROTONIX) 40 MG tablet Take 1 tablet (40 mg total) by mouth daily. 30 tablet 11  . rosuvastatin (CRESTOR) 10 MG tablet Take 10 mg by mouth at bedtime.   3   No current facility-administered medications for this visit.     Social History   Socioeconomic History  . Marital status: Married    Spouse name: Not on file  . Number of children: 1  . Years of education: Not on file  . Highest education level: Not on file  Occupational History  . Occupation: retired Tour manager  .  Financial resource strain: Not on file  . Food insecurity:    Worry: Not on file    Inability: Not on file  . Transportation needs:    Medical: Not on file    Non-medical: Not on file  Tobacco Use  . Smoking status: Never Smoker  . Smokeless tobacco: Never Used  Substance and Sexual Activity  . Alcohol use: No  . Drug use: No  . Sexual activity: Not Currently  Lifestyle  . Physical activity:    Days per week: Not on file    Minutes per session: Not on file  . Stress: Not on file  Relationships  . Social connections:    Talks on phone: Not on file    Gets together: Not on file    Attends religious service: Not on file    Active member of club or organization: Not on file    Attends meetings of clubs or organizations: Not on file    Relationship status: Not on file  . Intimate partner violence:    Fear of current or ex partner: Not on file    Emotionally abused: Not on file    Physically abused: Not on file    Forced sexual activity: Not on file  Other Topics Concern  . Not on file  Social History Narrative   . Not on file   Is not able to interact with her grandchildren because of COVID restrictions and this causes some sadness  Family History  Problem Relation Age of Onset  . Anesthesia problems Mother   . Hypertension Mother   . Hypertension Father   . Stroke Father   . Cancer Paternal Grandmother        not sure what type

## 2018-08-05 DIAGNOSIS — D225 Melanocytic nevi of trunk: Secondary | ICD-10-CM | POA: Diagnosis not present

## 2018-08-05 DIAGNOSIS — D1801 Hemangioma of skin and subcutaneous tissue: Secondary | ICD-10-CM | POA: Diagnosis not present

## 2018-08-05 DIAGNOSIS — L239 Allergic contact dermatitis, unspecified cause: Secondary | ICD-10-CM | POA: Diagnosis not present

## 2018-08-05 DIAGNOSIS — D2239 Melanocytic nevi of other parts of face: Secondary | ICD-10-CM | POA: Diagnosis not present

## 2018-08-06 DIAGNOSIS — R978 Other abnormal tumor markers: Secondary | ICD-10-CM | POA: Diagnosis not present

## 2018-08-06 DIAGNOSIS — C541 Malignant neoplasm of endometrium: Secondary | ICD-10-CM | POA: Diagnosis not present

## 2018-08-12 DIAGNOSIS — R1310 Dysphagia, unspecified: Secondary | ICD-10-CM | POA: Diagnosis not present

## 2018-08-12 DIAGNOSIS — Z8542 Personal history of malignant neoplasm of other parts of uterus: Secondary | ICD-10-CM | POA: Diagnosis not present

## 2018-08-12 DIAGNOSIS — M8589 Other specified disorders of bone density and structure, multiple sites: Secondary | ICD-10-CM | POA: Diagnosis not present

## 2018-08-12 DIAGNOSIS — G620 Drug-induced polyneuropathy: Secondary | ICD-10-CM | POA: Diagnosis not present

## 2018-08-22 DIAGNOSIS — Z0000 Encounter for general adult medical examination without abnormal findings: Secondary | ICD-10-CM | POA: Diagnosis not present

## 2018-08-22 DIAGNOSIS — E785 Hyperlipidemia, unspecified: Secondary | ICD-10-CM | POA: Diagnosis not present

## 2018-08-22 DIAGNOSIS — M8589 Other specified disorders of bone density and structure, multiple sites: Secondary | ICD-10-CM | POA: Diagnosis not present

## 2018-08-22 DIAGNOSIS — Z780 Asymptomatic menopausal state: Secondary | ICD-10-CM | POA: Diagnosis not present

## 2018-08-22 DIAGNOSIS — Z79899 Other long term (current) drug therapy: Secondary | ICD-10-CM | POA: Diagnosis not present

## 2018-08-22 DIAGNOSIS — Z6831 Body mass index (BMI) 31.0-31.9, adult: Secondary | ICD-10-CM | POA: Diagnosis not present

## 2018-08-22 DIAGNOSIS — M8580 Other specified disorders of bone density and structure, unspecified site: Secondary | ICD-10-CM | POA: Diagnosis not present

## 2018-09-13 ENCOUNTER — Other Ambulatory Visit: Payer: Self-pay

## 2018-11-12 DIAGNOSIS — R911 Solitary pulmonary nodule: Secondary | ICD-10-CM | POA: Diagnosis not present

## 2018-11-12 DIAGNOSIS — C55 Malignant neoplasm of uterus, part unspecified: Secondary | ICD-10-CM | POA: Diagnosis not present

## 2018-11-12 DIAGNOSIS — C786 Secondary malignant neoplasm of retroperitoneum and peritoneum: Secondary | ICD-10-CM | POA: Diagnosis not present

## 2018-11-13 DIAGNOSIS — C55 Malignant neoplasm of uterus, part unspecified: Secondary | ICD-10-CM | POA: Diagnosis not present

## 2018-11-13 DIAGNOSIS — C541 Malignant neoplasm of endometrium: Secondary | ICD-10-CM | POA: Diagnosis not present

## 2018-11-13 DIAGNOSIS — Z23 Encounter for immunization: Secondary | ICD-10-CM | POA: Diagnosis not present

## 2018-11-13 DIAGNOSIS — Z515 Encounter for palliative care: Secondary | ICD-10-CM | POA: Diagnosis not present

## 2018-11-13 DIAGNOSIS — M8589 Other specified disorders of bone density and structure, multiple sites: Secondary | ICD-10-CM | POA: Diagnosis not present

## 2018-11-13 DIAGNOSIS — C786 Secondary malignant neoplasm of retroperitoneum and peritoneum: Secondary | ICD-10-CM | POA: Diagnosis not present

## 2018-11-23 DIAGNOSIS — Z23 Encounter for immunization: Secondary | ICD-10-CM | POA: Diagnosis not present

## 2018-12-09 ENCOUNTER — Telehealth: Payer: Self-pay

## 2018-12-09 NOTE — Telephone Encounter (Signed)
Pt called Dr. Remi Deter  office and states that seh has a burning, red, and raw area on the wall of her vagina.  Pt wants to be seen sooner then 01-13-19. Pt can be seen tomorrow 12-10-18 at 3:15 PM by Dr. Denman George. Arrive at 2:45 to check in.

## 2018-12-10 ENCOUNTER — Encounter: Payer: Self-pay | Admitting: Gynecologic Oncology

## 2018-12-10 ENCOUNTER — Other Ambulatory Visit: Payer: Self-pay

## 2018-12-10 ENCOUNTER — Inpatient Hospital Stay: Payer: PPO | Attending: Gynecologic Oncology | Admitting: Gynecologic Oncology

## 2018-12-10 VITALS — BP 143/82 | HR 80 | Temp 98.5°F | Resp 20 | Ht 67.0 in | Wt 202.2 lb

## 2018-12-10 DIAGNOSIS — Z8542 Personal history of malignant neoplasm of other parts of uterus: Secondary | ICD-10-CM | POA: Diagnosis not present

## 2018-12-10 DIAGNOSIS — Z9221 Personal history of antineoplastic chemotherapy: Secondary | ICD-10-CM | POA: Insufficient documentation

## 2018-12-10 DIAGNOSIS — Z90722 Acquired absence of ovaries, bilateral: Secondary | ICD-10-CM | POA: Diagnosis not present

## 2018-12-10 DIAGNOSIS — Z08 Encounter for follow-up examination after completed treatment for malignant neoplasm: Secondary | ICD-10-CM

## 2018-12-10 DIAGNOSIS — Z79899 Other long term (current) drug therapy: Secondary | ICD-10-CM | POA: Insufficient documentation

## 2018-12-10 DIAGNOSIS — Z7951 Long term (current) use of inhaled steroids: Secondary | ICD-10-CM | POA: Insufficient documentation

## 2018-12-10 DIAGNOSIS — C541 Malignant neoplasm of endometrium: Secondary | ICD-10-CM

## 2018-12-10 DIAGNOSIS — Z90710 Acquired absence of both cervix and uterus: Secondary | ICD-10-CM | POA: Insufficient documentation

## 2018-12-10 NOTE — Progress Notes (Signed)
GYN ONC OUTPATIENT VISIT   Ceci Ravens Horst 68 y.o. female  Chief Complaint:   Surveillance endometrial cancer  Assessment : History of recurrent endometrial carcinoma  No evidence for recurrent disease.  Plan:  NED since 01/2016 Follow-up with Gyn Onc in 6 months. Follow-up with Dr. Hinton Rao as scheduled  F/U with PCP reg immunizations   HPI:   Oncology History Overview Note  Livvy Dhaliwal Bostick reports having an episode of vaginal spotting in May 2016. She initially thought this was due to urethral irritation as she has bladder prolapse. She then continued to spot through the summer and fall of 2016 and was evaluated by Dr. Marvel Plan on 01/19/2015 at which time a transvaginal ultrasound scan was performed which revealed a normal size uterus measuring 7.4 x 8.4 x 3.7 cm with a thickened endometrial stripe 15 mm. The ovaries were grossly normal. She then underwent a endometrial biopsy on 01/28/2015 which revealed FIGO grade 2 moderately differentiated endometrioid adenocarcinoma   Endometrial cancer ([place])  01/28/2015 Initial Diagnosis   Endometrial cancer ([place]) FIGO grade 2 moderately differentiated endometrioid adenocarcinoma   02/25/2015 Surgery   robotic laparoscopic hysterectomy bilateral salpingo-oophorectomy and sentinel lymph nodes on    02/25/2015 Pathology Results    grade 3 endometrial cancer measuring 2.4 cm in diameter with superficial myometrial invasion. There was focal lymphatic vascular involvement. Lymph nodes and adnexa were negative for any metastatic disease.  Stage IA     - 06/11/2015 Radiation Therapy   Vaginal cuff brachytherapy   08/2015 Relapse/Recurrence    She was seen by Dr. Sofie Hartigan in mid July and at that time complained of increasing pelvic and lower back pain. CT scan was obtained on July 26 demonstrated what appears to be intraperitoneal disease including a left-sided metastatic site measuring 4.6 x 3.4 x 3 cm causing mass effect on the distal third  of the left ureter and moderate proximal hydronephrosis and has some mass effect on the sigmoid colon. There were 2 implants in the right hemipelvis measuring 3 x 2.2 and 2.9 x 2.3 cm.   CT Guided biopsy c/w recurrent endometrial cancer.    08/2015 - 01/15/2016 Chemotherapy   Received adjuvant taxol/carboplatin x 6   02/10/2016 Imaging   Imaging 02/10/2016 without evidence of metastatic carcinoma   05/08/2016 Imaging   Imaging 05/08/2016 without evidence of recurrent disease.   11/02/2016 Imaging   No evidence of metastatic disease.  Bilateral inguinal hernias containing fat   04/27/2017 Imaging   CT chest abdomen and pelvis No findings worrisome for metastatic disease in the chest abdomen or pelvis.  There is small pulmonary nodules bilaterally which are predominantly subpleural or perifissural all likely lymph nodes some of these were not previously imaged suggest attention on routine follow-up   05/01/2017 Miscellaneous   CA125    8.8   11/01/2017 Imaging   CT of the chest abdomen and pelvis with contrast Small 4 mm right upper lobe pulmonary nodule likely part of the apical pleural-parenchymal scarring Small type I hiatal hernia aortic atherosclerosis no findings of active or recurrent malignancy   04/29/2018 Imaging   CT of the abdomen and pelvis No evidence of recurrent or metastatic disease     Review of Systems: Returns today with complaint of concerns of finding an excoriation/red area at posterior introitus.   Vitals: Blood pressure (!) 143/82, pulse 80, temperature 98.5 F (36.9 C), temperature source Temporal, resp. rate 20, height 5\' 7"  (1.702 m), weight 202 lb 3 oz (91.7 kg), SpO2  100 %.  Wt Readings from Last 3 Encounters:  12/10/18 202 lb 3 oz (91.7 kg)  07/11/18 203 lb 1.6 oz (92.1 kg)  05/28/18 200 lb (90.7 kg)     Physical Exam: General : The patient is a healthy woman in no acute distress, HEENT: normocephalic, extraoccular movements normal; Sclera nonicteric   Lymph nodes: Supraclavicular and inguinal nodes not enlarged  Abdomen: Soft, non-tender,obese,  no ascites, no organomegally, no masses, no hernias  Pelvic:  EGBUS: Normal female  Vagina: Normal, No discharge, tenderness or masses. Atrophic anterior and posterior vaginal wall prolapse. No excoriation.  Lower extremities: No edema or varicosities. Normal range of motion  Rectal: Good tone no masses no rectovaginal septum nodularity   Past Medical History:  Diagnosis Date  . Cancer [facility])    endometrial cancer  . Complication of anesthesia    slow to wake up  . Frequency of urination   . GERD (gastroesophageal reflux disease)   . Headache    hx migraines  . Hyperlipidemia   . Osteopenia   . PONV (postoperative nausea and vomiting)   . Radiation 05/19/15-06/11/15   vaginal cuff 30 Gy  . Urgency incontinence     Past Surgical History:  Procedure Laterality Date  . CHOLECYSTECTOMY    . ROBOTIC ASSISTED TOTAL HYSTERECTOMY WITH BILATERAL SALPINGO OOPHERECTOMY Bilateral 02/25/2015   Procedure: XI ROBOTIC ASSISTED TOTAL HYSTERECTOMY WITH BILATERAL SALPINGO OOPHORECTOMY WITH SENTINAL LYMPH NODE MAPPING;  Surgeon: [name], MD;  Location: [facility];  Service: Gynecology;  Laterality: Bilateral;  . TUBAL LIGATION     1986    Current Outpatient Medications  Medication Sig Dispense Refill  . calcium carbonate (TUMS - DOSED IN MG ELEMENTAL CALCIUM) 500 MG chewable tablet Chew 1 tablet by mouth 3 (three) times daily as needed for indigestion or heartburn. Reported on 05/19/2015    . Calcium Carbonate-Vit D-Min (CALCIUM 600+D PLUS MINERALS) 600-400 MG-UNIT CHEW Chew 1 tablet by mouth 2 (two) times a day.    . cetirizine (ZYRTEC) 10 MG tablet Take 10 mg by mouth daily.    Marland Kitchen gabapentin (NEURONTIN) 300 MG capsule Take 300 mg by mouth 3 (three) times daily.    Marland Kitchen ibuprofen (ADVIL,MOTRIN) 200 MG tablet Take 200 mg by mouth every 6 (six) hours as needed (pain). Reported on 04/15/2015    . mometasone  (NASONEX) 50 MCG/ACT nasal spray Place 2 sprays into the nose daily as needed.    . Multiple Vitamin (MULTIVITAMIN) tablet Take 1 tablet by mouth daily. Women's one a day vitamin    . OVER THE COUNTER MEDICATION Vitamin B6 100 mg tab daily    . OVER THE COUNTER MEDICATION Vita fusion Beautiful Hair with biotin 2500 mcg one gummie a day    . pantoprazole (PROTONIX) 40 MG tablet Take 1 tablet (40 mg total) by mouth daily. 30 tablet 11  . rosuvastatin (CRESTOR) 10 MG tablet Take 10 mg by mouth at bedtime.   3   No current facility-administered medications for this visit.     Social History   Socioeconomic History  . Marital status: Married    Spouse name: Not on file  . Number of children: 1  . Years of education: Not on file  . Highest education level: Not on file  Occupational History  . Occupation: retired Tour manager  . Financial resource strain: Not on file  . Food insecurity    Worry: Not on file    Inability: Not on file  .  Transportation needs    Medical: Not on file    Non-medical: Not on file  Tobacco Use  . Smoking status: Never Smoker  . Smokeless tobacco: Never Used  Substance and Sexual Activity  . Alcohol use: No  . Drug use: No  . Sexual activity: Not Currently  Lifestyle  . Physical activity    Days per week: Not on file    Minutes per session: Not on file  . Stress: Not on file  Relationships  . Social Herbalist on phone: Not on file    Gets together: Not on file    Attends religious service: Not on file    Active member of club or organization: Not on file    Attends meetings of clubs or organizations: Not on file    Relationship status: Not on file  . Intimate partner violence    Fear of current or ex partner: Not on file    Emotionally abused: Not on file    Physically abused: Not on file    Forced sexual activity: Not on file  Other Topics Concern  . Not on file  Social History Narrative  . Not on file   Is not able to  interact with her grandchildren because of COVID restrictions and this causes some sadness  Family History  Problem Relation Age of Onset  . Anesthesia problems Mother   . Hypertension Mother   . Hypertension Father   . Stroke Father   . Cancer Paternal Grandmother        not sure what type    Thereasa Solo, MD

## 2018-12-10 NOTE — Patient Instructions (Addendum)
Please notify Dr Denman George at phone number [phone] if you notice vaginal bleeding, new pelvic or abdominal pains, bloating, feeling full easy, or a change in bladder or bowel function.   Please contact Dr Serita Grit office (at [phone]) in January, 2021 to request an appointment with April, 2021.  Dr Denman George did not see any cancer recurrence. There is prolapse (weakening) of the vaginal wall which may have caused abrasions with the pantiliner.

## 2019-01-13 ENCOUNTER — Ambulatory Visit: Payer: PPO | Admitting: Gynecologic Oncology

## 2019-02-10 DIAGNOSIS — D2239 Melanocytic nevi of other parts of face: Secondary | ICD-10-CM | POA: Diagnosis not present

## 2019-02-10 DIAGNOSIS — D225 Melanocytic nevi of trunk: Secondary | ICD-10-CM | POA: Diagnosis not present

## 2019-02-10 DIAGNOSIS — D1801 Hemangioma of skin and subcutaneous tissue: Secondary | ICD-10-CM | POA: Diagnosis not present

## 2019-02-10 DIAGNOSIS — L814 Other melanin hyperpigmentation: Secondary | ICD-10-CM | POA: Diagnosis not present

## 2019-03-11 DIAGNOSIS — Z1231 Encounter for screening mammogram for malignant neoplasm of breast: Secondary | ICD-10-CM | POA: Diagnosis not present

## 2019-03-13 DIAGNOSIS — M8589 Other specified disorders of bone density and structure, multiple sites: Secondary | ICD-10-CM | POA: Diagnosis not present

## 2019-03-13 DIAGNOSIS — G620 Drug-induced polyneuropathy: Secondary | ICD-10-CM | POA: Diagnosis not present

## 2019-03-13 DIAGNOSIS — R918 Other nonspecific abnormal finding of lung field: Secondary | ICD-10-CM | POA: Diagnosis not present

## 2019-03-13 DIAGNOSIS — Z8542 Personal history of malignant neoplasm of other parts of uterus: Secondary | ICD-10-CM | POA: Diagnosis not present

## 2019-03-21 DIAGNOSIS — Z452 Encounter for adjustment and management of vascular access device: Secondary | ICD-10-CM | POA: Diagnosis not present

## 2019-03-21 DIAGNOSIS — C541 Malignant neoplasm of endometrium: Secondary | ICD-10-CM | POA: Diagnosis not present

## 2019-04-15 ENCOUNTER — Telehealth: Payer: Self-pay | Admitting: Gastroenterology

## 2019-04-15 MED ORDER — PANTOPRAZOLE SODIUM 40 MG PO TBEC: 40.0000 mg | DELAYED_RELEASE_TABLET | Freq: Every day | ORAL | 4 refills | Status: DC

## 2019-04-15 NOTE — Telephone Encounter (Signed)
If she is feeling better we would hold off on EGD for now Please fill in Protonix 40 mg p.o. once a day, 90 with 4 refills  RG

## 2019-04-15 NOTE — Telephone Encounter (Signed)
Called and spoke with patient -patient informed of RX refill being sent to pharmacy listed in chart; patient has many questions and has requested to be seen in the [place] in order for EGD to be/not to be scheduled; OV has been scheduled on 04/25/2019 at 3:20 pm;  Patient advised to call back to the office at [phone] should questions/concerns arise;  Patient verbalized understanding of information/instructions;

## 2019-04-15 NOTE — Telephone Encounter (Signed)
Please review previous message and advise 

## 2019-04-15 NOTE — Telephone Encounter (Signed)
Patient called and stated she was suppose to schedule for EGD last year but did not due to everything starting with Covid. She said she has been feeling better since she's been on Protonix. Would like to know if she needs to follow up with Dr. Lyndel Safe for a refill and she also would like to know if Dr. Lyndel Safe still recommends she go through having EGD.

## 2019-04-25 ENCOUNTER — Ambulatory Visit (INDEPENDENT_AMBULATORY_CARE_PROVIDER_SITE_OTHER): Payer: PPO | Admitting: Gastroenterology

## 2019-04-25 ENCOUNTER — Other Ambulatory Visit: Payer: Self-pay

## 2019-04-25 ENCOUNTER — Encounter: Payer: Self-pay | Admitting: Gastroenterology

## 2019-04-25 VITALS — BP 134/86 | HR 75 | Temp 97.5°F | Ht 67.0 in | Wt 204.5 lb

## 2019-04-25 DIAGNOSIS — K219 Gastro-esophageal reflux disease without esophagitis: Secondary | ICD-10-CM

## 2019-04-25 DIAGNOSIS — R1310 Dysphagia, unspecified: Secondary | ICD-10-CM

## 2019-04-25 NOTE — Progress Notes (Signed)
Chief Complaint: Dysphagia  Referring Provider:  [name], MD      ASSESSMENT AND PLAN;    #1. GERD with eso dysphagia.  Reluctant to do EGD at this time.  #2.  Endometrial adenocarcinoma s/p robotic TAH with BSO 02/2015 followed by RT with recurrence of peritoneum 08/2015 treated with 6 cycles of carboplatin/paclitaxel neg CT abdo/pelvis 04/2018. Neg colon 09/2013  Plan: - Protonix 40 mg p.o. QD, 1/2 hr before supper #30.  11 refills. - Ba swallow with Ba tab - If still with problems or if Ba is abn, EGD with eso Bx/dil. - Raised HOB 6 inch blocks. - If still with problems- pepcid QHS or GI coctail. - I have instructed patient to chew foods especially meats and breads well and eat slowly.   HPI:    Erica Moyer is a 69 y.o. female  [name] Hamilton's mother For follow-up visit. Started on Protonix 40 mg p.o. once a day She feels significantly better and decided to hold off on EGD.  Over the last few days her reflux has come back.  It occurs mostly in the early morning time.  She takes Tums and it gets better.  I have asked to take Protonix before supper.  Dysphagia has only intermittent, slightly better than the last time.  Mostly in the mid chest.  Currently mostly to pills especially Neurontin.  Heartburn has resolved.  Did not tolerate Nexium in the past due to abdominal bloating.  Has stopped drinking coffee, chocolates.  Does admit that Crestor has always made her reflux worse.  She has been taking it at night. Was very much concerned with esophageal neoplasms.  No significant weight loss.  Alt diarrhea and constipation, Dx with IBS in 2005 No melena or hematochezia.  Review of labs from 04/29/2018-hemoglobin 13.7, MCV 88, platelets 144, WBC count 4.7, CA 125, CEA 0.6.  Normal CMP with BUN/creatinine 13/0.7.  Normal LFTs.  Magnesium 1.9.  Past GI procedures: -Colonoscopy 09/2013 ([facility])- neg. -CT Abdo/pelvis 04/29/2018 at [facility] negative for any  recurrence.  Normal liver, status post cholecystectomy, normal pancreas, spleen mild scarring in the right upper kidney.  Otherwise unremarkable. Past Medical History:  Diagnosis Date  . Cancer [facility])    endometrial cancer  . Complication of anesthesia    slow to wake up  . Frequency of urination   . GERD (gastroesophageal reflux disease)   . Headache    hx migraines  . Hyperlipidemia   . Neuropathy   . Osteopenia   . PONV (postoperative nausea and vomiting)   . Radiation 05/19/15-06/11/15   vaginal cuff 30 Gy  . Urgency incontinence     Past Surgical History:  Procedure Laterality Date  . ABDOMINAL HYSTERECTOMY    . CHOLECYSTECTOMY    . ROBOTIC ASSISTED TOTAL HYSTERECTOMY WITH BILATERAL SALPINGO OOPHERECTOMY Bilateral 02/25/2015   Procedure: XI ROBOTIC ASSISTED TOTAL HYSTERECTOMY WITH BILATERAL SALPINGO OOPHORECTOMY WITH SENTINAL LYMPH NODE MAPPING;  Surgeon: [name], MD;  Location: [facility];  Service: Gynecology;  Laterality: Bilateral;  . TUBAL LIGATION     1986    Family History  Problem Relation Age of Onset  . Anesthesia problems Mother   . Hypertension Mother   . Hypertension Father   . Stroke Father   . Cancer Paternal Grandmother        not sure what type    Social History   Tobacco Use  . Smoking status: Never Smoker  . Smokeless tobacco: Never Used  Substance Use Topics  . Alcohol use: No  . Drug use: No    Current Outpatient Medications  Medication Sig Dispense Refill  . calcium carbonate (TUMS - DOSED IN MG ELEMENTAL CALCIUM) 500 MG chewable tablet Chew 1 tablet by mouth 3 (three) times daily as needed for indigestion or heartburn. Reported on 05/19/2015    . Calcium Carbonate-Vit D-Min (CALCIUM 600+D PLUS MINERALS) 600-400 MG-UNIT CHEW Chew 1 tablet by mouth 2 (two) times a day.    . cetirizine (ZYRTEC) 10 MG tablet Take 10 mg by mouth daily.    Marland Kitchen gabapentin (NEURONTIN) 300 MG capsule Take 300 mg by mouth 3 (three) times daily.    Marland Kitchen ibuprofen  (ADVIL,MOTRIN) 200 MG tablet Take 200 mg by mouth every 6 (six) hours as needed (pain). Reported on 04/15/2015    . OVER THE COUNTER MEDICATION Vitamin B6 100 mg tab daily    . pantoprazole (PROTONIX) 40 MG tablet Take 1 tablet (40 mg total) by mouth daily. 90 tablet 4  . Prenatal MV-Min-FA-Omega-3 (PRENATAL GUMMIES/DHA & FA PO) Take 2 tablets by mouth daily. Take 2 gummies. One a Day multivitamin    . rosuvastatin (CRESTOR) 10 MG tablet Take 10 mg by mouth at bedtime.   3  . mometasone (NASONEX) 50 MCG/ACT nasal spray Place 2 sprays into the nose daily as needed.     No current facility-administered medications for this visit.    No Known Allergies  Review of Systems:  Constitutional: Denies fever, chills, diaphoresis, appetite change and fatigue.  HEENT: Denies photophobia, eye pain, redness, hearing loss, ear pain, congestion, sore throat, rhinorrhea, sneezing, mouth sores, neck pain, neck stiffness and tinnitus.   Respiratory: Denies SOB, DOE, cough, chest tightness,  and wheezing.   Cardiovascular: Denies chest pain, palpitations and leg swelling.  Genitourinary: Denies dysuria, urgency, frequency, hematuria, flank pain and difficulty urinating.  Musculoskeletal: Denies myalgias, back pain, joint swelling, arthralgias and gait problem.  Skin: No rash.  Neurological: Denies dizziness, seizures, syncope, weakness, light-headedness, numbness and headaches.  Hematological: Denies adenopathy. Easy bruising, personal or family bleeding history  Psychiatric/Behavioral: has anxiety or depression     Physical Exam:    BP 134/86   Pulse 75   Temp (!) 97.5 F (36.4 C)   Ht 5\' 7"  (1.702 m)   Wt 204 lb 8 oz (92.8 kg)   BMI 32.03 kg/m  Filed Weights   04/25/19 1522  Weight: 204 lb 8 oz (92.8 kg)  HEENT: No jaundice, pallor. Resp: Bilaterally clear CVS: S1-S2 normal no S3 or S4. Abdomen: Soft nontender bowel sounds present no definite hepatosplenomegaly.  Data Reviewed: I have  personally reviewed following labs and imaging studies  CBC: CBC Latest Ref Rng & Units 09/16/2015 08/25/2015 02/26/2015  WBC 4.0 - 10.5 K/uL 8.0 6.2 8.9  Hemoglobin 12.0 - 15.0 g/dL 13.6 13.1 12.5  Hematocrit 36.0 - 46.0 % 40.2 39.6 39.3  Platelets 150 - 400 K/uL 211 176 187    CMP: CMP Latest Ref Rng & Units 08/25/2015 02/26/2015 02/23/2015  Glucose 70 - 140 mg/dl 90 103(H) 103(H)  BUN 7.0 - 26.0 mg/dL 15.1 10 13   Creatinine 0.6 - 1.1 mg/dL 0.8 0.78 0.69  Sodium 136 - 145 mEq/L 143 144 144  Potassium 3.5 - 5.1 mEq/L 3.9 4.2 4.6  Chloride 101 - 111 mmol/L - 110 110  CO2 22 - 29 mEq/L 24 24 27   Calcium 8.4 - 10.4 mg/dL 9.6 9.1 9.8  Total Protein 6.5 - 8.1 g/dL - -  7.6  Total Bilirubin 0.3 - 1.2 mg/dL - - 0.9  Alkaline Phos 38 - 126 U/L - - 54  AST 15 - 41 U/L - - 22  ALT 14 - 54 U/L - - 23       Carmell Austria, MD 04/25/2019, 3:40 PM  Cc: [name], MD

## 2019-04-25 NOTE — Patient Instructions (Signed)
You have been scheduled for a Barium Esophogram     At       Please arrive 15 minutes prior to your appointment for registration. Make certain not to have anything to eat or drink 3 hours prior to your test. If you need to reschedule for any reason, please contact radiology at [phone] to do so. __________________________________________________________________ A barium swallow is an examination that concentrates on views of the esophagus. This tends to be a double contrast exam (barium and two liquids which, when combined, create a gas to distend the wall of the oesophagus) or single contrast (non-ionic iodine based). The study is usually tailored to your symptoms so a good history is essential. Attention is paid during the study to the form, structure and configuration of the esophagus, looking for functional disorders (such as aspiration, dysphagia, achalasia, motility and reflux) EXAMINATION You may be asked to change into a gown, depending on the type of swallow being performed. A radiologist and radiographer will perform the procedure. The radiologist will advise you of the type of contrast selected for your procedure and direct you during the exam. You will be asked to stand, sit or lie in several different positions and to hold a small amount of fluid in your mouth before being asked to swallow while the imaging is performed .In some instances you may be asked to swallow barium coated marshmallows to assess the motility of a solid food bolus. The exam can be recorded as a digital or video fluoroscopy procedure. POST PROCEDURE It will take 1-2 days for the barium to pass through your system. To facilitate this, it is important, unless otherwise directed, to increase your fluids for the next 24-48hrs and to resume your normal diet.  This test typically takes about 30 minutes to perform.  Take protonix 1/2 hour before supper  Elevate head of bed 6 inches with blocks  Thank you,  Dr. Jackquline Denmark  __________________________________________________________________________________

## 2019-04-30 ENCOUNTER — Encounter: Payer: Self-pay | Admitting: Gastroenterology

## 2019-04-30 DIAGNOSIS — K449 Diaphragmatic hernia without obstruction or gangrene: Secondary | ICD-10-CM | POA: Diagnosis not present

## 2019-04-30 DIAGNOSIS — R1310 Dysphagia, unspecified: Secondary | ICD-10-CM | POA: Diagnosis not present

## 2019-06-10 ENCOUNTER — Telehealth: Payer: Self-pay | Admitting: *Deleted

## 2019-06-10 NOTE — Telephone Encounter (Signed)
Called and moved the patient's appt from the afternoon to the morning

## 2019-06-10 NOTE — Progress Notes (Signed)
GYN ONC OUTPATIENT VISIT   Erica Moyer 69 y.o. female  Chief Complaint:   Surveillance endometrial cancer  Assessment : History of recurrent endometrial carcinoma  No evidence for recurrent disease.  Plan:  NED since 01/2016 Follow-up with Gyn Onc in 6 months. Follow-up with Dr. Hinton Rao as scheduled  F/U with PCP reg immunizations   HPI:   Oncology History Overview Note  Erica Moyer reports having an episode of vaginal spotting in May 2016. She initially thought this was due to urethral irritation as she has bladder prolapse. She then continued to spot through the summer and fall of 2016 and was evaluated by Dr. Marvel Plan on 01/19/2015 at which time a transvaginal ultrasound scan was performed which revealed a normal size uterus measuring 7.4 x 8.4 x 3.7 cm with a thickened endometrial stripe 15 mm. The ovaries were grossly normal. She then underwent a endometrial biopsy on 01/28/2015 which revealed FIGO grade 2 moderately differentiated endometrioid adenocarcinoma   Endometrial cancer ([place])  01/28/2015 Initial Diagnosis   Endometrial cancer ([place]) FIGO grade 2 moderately differentiated endometrioid adenocarcinoma   02/25/2015 Surgery   robotic laparoscopic hysterectomy bilateral salpingo-oophorectomy and sentinel lymph nodes on    02/25/2015 Pathology Results    grade 3 endometrial cancer measuring 2.4 cm in diameter with superficial myometrial invasion. There was focal lymphatic vascular involvement. Lymph nodes and adnexa were negative for any metastatic disease.  Stage IA     - 06/11/2015 Radiation Therapy   Vaginal cuff brachytherapy   08/2015 Relapse/Recurrence    She was seen by Dr. Sofie Hartigan in mid July and at that time complained of increasing pelvic and lower back pain. CT scan was obtained on July 26 demonstrated what appears to be intraperitoneal disease including a left-sided metastatic site measuring 4.6 x 3.4 x 3 cm causing mass effect on the distal third  of the left ureter and moderate proximal hydronephrosis and has some mass effect on the sigmoid colon. There were 2 implants in the right hemipelvis measuring 3 x 2.2 and 2.9 x 2.3 cm.   CT Guided biopsy c/w recurrent endometrial cancer.    08/2015 - 01/15/2016 Chemotherapy   Received adjuvant taxol/carboplatin x 6   02/10/2016 Imaging   Imaging 02/10/2016 without evidence of metastatic carcinoma   05/08/2016 Imaging   Imaging 05/08/2016 without evidence of recurrent disease.   11/02/2016 Imaging   No evidence of metastatic disease.  Bilateral inguinal hernias containing fat   04/27/2017 Imaging   CT chest abdomen and pelvis No findings worrisome for metastatic disease in the chest abdomen or pelvis.  There is small pulmonary nodules bilaterally which are predominantly subpleural or perifissural all likely lymph nodes some of these were not previously imaged suggest attention on routine follow-up   05/01/2017 Miscellaneous   CA125    8.8   11/01/2017 Imaging   CT of the chest abdomen and pelvis with contrast Small 4 mm right upper lobe pulmonary nodule likely part of the apical pleural-parenchymal scarring Small type I hiatal hernia aortic atherosclerosis no findings of active or recurrent malignancy   04/29/2018 Imaging   CT of the abdomen and pelvis No evidence of recurrent or metastatic disease     Interval Hx:  She has no symptoms concerning for recurrence, or enduring toxicities of therapy. Her next scheduled CT imaging with Dr Hinton Rao is for May 25th, 2021.  Review of Systems: Returns today with complaint of concerns of finding an excoriation/red area at posterior introitus.   Vitals: There  were no vitals taken for this visit.  Wt Readings from Last 3 Encounters:  04/25/19 204 lb 8 oz (92.8 kg)  12/10/18 202 lb 3 oz (91.7 kg)  07/11/18 203 lb 1.6 oz (92.1 kg)     Physical Exam: General : The patient is a healthy woman in no acute distress, HEENT: normocephalic,  extraoccular movements normal; Sclera nonicteric  Lymph nodes: Supraclavicular and inguinal nodes not enlarged  Abdomen: Soft, non-tender,obese,  no ascites, no organomegally, no masses, no hernias  Pelvic:  EGBUS: Normal female  Vagina: Normal, No discharge, tenderness or masses. Atrophic anterior and posterior vaginal wall prolapse. No excoriation. No masses on deep pelvic bimanual exam. Lower extremities: No edema or varicosities. Normal range of motion  Rectal: Good tone no masses no rectovaginal septum nodularity   Past Medical History:  Diagnosis Date  . Cancer [facility])    endometrial cancer  . Complication of anesthesia    slow to wake up  . Frequency of urination   . GERD (gastroesophageal reflux disease)   . Headache    hx migraines  . Hyperlipidemia   . Neuropathy   . Osteopenia   . PONV (postoperative nausea and vomiting)   . Radiation 05/19/15-06/11/15   vaginal cuff 30 Gy  . Urgency incontinence     Past Surgical History:  Procedure Laterality Date  . ABDOMINAL HYSTERECTOMY    . CHOLECYSTECTOMY    . ROBOTIC ASSISTED TOTAL HYSTERECTOMY WITH BILATERAL SALPINGO OOPHERECTOMY Bilateral 02/25/2015   Procedure: XI ROBOTIC ASSISTED TOTAL HYSTERECTOMY WITH BILATERAL SALPINGO OOPHORECTOMY WITH SENTINAL LYMPH NODE MAPPING;  Surgeon: [name], MD;  Location: [facility];  Service: Gynecology;  Laterality: Bilateral;  . TUBAL LIGATION     1986    Current Outpatient Medications  Medication Sig Dispense Refill  . calcium carbonate (TUMS - DOSED IN MG ELEMENTAL CALCIUM) 500 MG chewable tablet Chew 1 tablet by mouth 3 (three) times daily as needed for indigestion or heartburn. Reported on 05/19/2015    . Calcium Carbonate-Vit D-Min (CALCIUM 600+D PLUS MINERALS) 600-400 MG-UNIT CHEW Chew 1 tablet by mouth 2 (two) times a day.    . cetirizine (ZYRTEC) 10 MG tablet Take 10 mg by mouth daily.    Marland Kitchen gabapentin (NEURONTIN) 300 MG capsule Take 300 mg by mouth 3 (three) times daily.    Marland Kitchen  ibuprofen (ADVIL,MOTRIN) 200 MG tablet Take 200 mg by mouth every 6 (six) hours as needed (pain). Reported on 04/15/2015    . mometasone (NASONEX) 50 MCG/ACT nasal spray Place 2 sprays into the nose daily as needed.    Marland Kitchen OVER THE COUNTER MEDICATION Vitamin B6 100 mg tab daily    . pantoprazole (PROTONIX) 40 MG tablet Take 1 tablet (40 mg total) by mouth daily. (Patient taking differently: Take 40 mg by mouth daily. Take 1/2 hour before supper) 90 tablet 4  . Prenatal MV-Min-FA-Omega-3 (PRENATAL GUMMIES/DHA & FA PO) Take 2 tablets by mouth daily. Take 2 gummies. One a Day multivitamin    . rosuvastatin (CRESTOR) 10 MG tablet Take 10 mg by mouth at bedtime.   3   No current facility-administered medications for this visit.    Social History   Socioeconomic History  . Marital status: Married    Spouse name: Not on file  . Number of children: 1  . Years of education: Not on file  . Highest education level: Not on file  Occupational History  . Occupation: retired Pharmacist, hospital  Tobacco Use  . Smoking status: Never Smoker  .  Smokeless tobacco: Never Used  Substance and Sexual Activity  . Alcohol use: No  . Drug use: No  . Sexual activity: Not Currently  Other Topics Concern  . Not on file  Social History Narrative  . Not on file   Social Determinants of Health   Financial Resource Strain:   . Difficulty of Paying Living Expenses:   Food Insecurity:   . Worried About Charity fundraiser in the Last Year:   . Arboriculturist in the Last Year:   Transportation Needs:   . Film/video editor (Medical):   Marland Kitchen Lack of Transportation (Non-Medical):   Physical Activity:   . Days of Exercise per Week:   . Minutes of Exercise per Session:   Stress:   . Feeling of Stress :   Social Connections:   . Frequency of Communication with Friends and Family:   . Frequency of Social Gatherings with Friends and Family:   . Attends Religious Services:   . Active Member of Clubs or Organizations:   .  Attends Archivist Meetings:   Marland Kitchen Marital Status:   Intimate Partner Violence:   . Fear of Current or Ex-Partner:   . Emotionally Abused:   Marland Kitchen Physically Abused:   . Sexually Abused:    Is not able to interact with her grandchildren because of COVID restrictions and this causes some sadness  Family History  Problem Relation Age of Onset  . Anesthesia problems Mother   . Hypertension Mother   . Hypertension Father   . Stroke Father   . Cancer Paternal Grandmother        not sure what type    Thereasa Solo, MD

## 2019-06-11 ENCOUNTER — Encounter: Payer: Self-pay | Admitting: Gynecologic Oncology

## 2019-06-11 ENCOUNTER — Other Ambulatory Visit: Payer: Self-pay

## 2019-06-11 ENCOUNTER — Inpatient Hospital Stay: Payer: PPO | Attending: Gynecologic Oncology | Admitting: Gynecologic Oncology

## 2019-06-11 VITALS — BP 113/56 | HR 63 | Temp 98.5°F | Resp 16 | Ht 67.0 in | Wt 203.0 lb

## 2019-06-11 DIAGNOSIS — Z8542 Personal history of malignant neoplasm of other parts of uterus: Secondary | ICD-10-CM | POA: Insufficient documentation

## 2019-06-11 DIAGNOSIS — Z90710 Acquired absence of both cervix and uterus: Secondary | ICD-10-CM | POA: Insufficient documentation

## 2019-06-11 DIAGNOSIS — C541 Malignant neoplasm of endometrium: Secondary | ICD-10-CM

## 2019-06-11 DIAGNOSIS — Z923 Personal history of irradiation: Secondary | ICD-10-CM

## 2019-06-11 DIAGNOSIS — Z90722 Acquired absence of ovaries, bilateral: Secondary | ICD-10-CM | POA: Insufficient documentation

## 2019-06-11 DIAGNOSIS — Z8541 Personal history of malignant neoplasm of cervix uteri: Secondary | ICD-10-CM | POA: Diagnosis not present

## 2019-06-11 DIAGNOSIS — Z08 Encounter for follow-up examination after completed treatment for malignant neoplasm: Secondary | ICD-10-CM | POA: Diagnosis not present

## 2019-06-11 DIAGNOSIS — Z9221 Personal history of antineoplastic chemotherapy: Secondary | ICD-10-CM | POA: Insufficient documentation

## 2019-06-11 NOTE — Patient Instructions (Signed)
Please contact Dr Serita Grit office (at [phone]) in July to request an appointment with her for October, 2021.  Please follow-up with Dr Hinton Rao as scheduled.  Please notify Dr Denman George at phone number [phone] if you notice vaginal bleeding, new pelvic or abdominal pains, bloating, feeling full easy, or a change in bladder or bowel function.

## 2019-07-09 DIAGNOSIS — C55 Malignant neoplasm of uterus, part unspecified: Secondary | ICD-10-CM | POA: Diagnosis not present

## 2019-07-09 DIAGNOSIS — R970 Elevated carcinoembryonic antigen [CEA]: Secondary | ICD-10-CM | POA: Diagnosis not present

## 2019-07-09 DIAGNOSIS — C786 Secondary malignant neoplasm of retroperitoneum and peritoneum: Secondary | ICD-10-CM | POA: Diagnosis not present

## 2019-07-09 DIAGNOSIS — R971 Elevated cancer antigen 125 [CA 125]: Secondary | ICD-10-CM | POA: Diagnosis not present

## 2019-07-09 DIAGNOSIS — C541 Malignant neoplasm of endometrium: Secondary | ICD-10-CM | POA: Diagnosis not present

## 2019-07-11 DIAGNOSIS — R911 Solitary pulmonary nodule: Secondary | ICD-10-CM | POA: Diagnosis not present

## 2019-07-11 DIAGNOSIS — C55 Malignant neoplasm of uterus, part unspecified: Secondary | ICD-10-CM | POA: Diagnosis not present

## 2019-07-11 DIAGNOSIS — C786 Secondary malignant neoplasm of retroperitoneum and peritoneum: Secondary | ICD-10-CM | POA: Diagnosis not present

## 2019-07-11 DIAGNOSIS — G620 Drug-induced polyneuropathy: Secondary | ICD-10-CM | POA: Diagnosis not present

## 2019-07-11 DIAGNOSIS — M8589 Other specified disorders of bone density and structure, multiple sites: Secondary | ICD-10-CM | POA: Diagnosis not present

## 2019-07-11 DIAGNOSIS — C541 Malignant neoplasm of endometrium: Secondary | ICD-10-CM | POA: Diagnosis not present

## 2019-09-04 DIAGNOSIS — L578 Other skin changes due to chronic exposure to nonionizing radiation: Secondary | ICD-10-CM | POA: Diagnosis not present

## 2019-09-04 DIAGNOSIS — D2239 Melanocytic nevi of other parts of face: Secondary | ICD-10-CM | POA: Diagnosis not present

## 2019-09-04 DIAGNOSIS — D1801 Hemangioma of skin and subcutaneous tissue: Secondary | ICD-10-CM | POA: Diagnosis not present

## 2019-09-04 DIAGNOSIS — D225 Melanocytic nevi of trunk: Secondary | ICD-10-CM | POA: Diagnosis not present

## 2019-09-09 ENCOUNTER — Other Ambulatory Visit: Payer: Self-pay

## 2019-09-10 DIAGNOSIS — Z1331 Encounter for screening for depression: Secondary | ICD-10-CM | POA: Diagnosis not present

## 2019-09-10 DIAGNOSIS — Z79899 Other long term (current) drug therapy: Secondary | ICD-10-CM | POA: Diagnosis not present

## 2019-09-10 DIAGNOSIS — E7800 Pure hypercholesterolemia, unspecified: Secondary | ICD-10-CM | POA: Diagnosis not present

## 2019-09-10 DIAGNOSIS — Z0000 Encounter for general adult medical examination without abnormal findings: Secondary | ICD-10-CM | POA: Diagnosis not present

## 2019-09-10 DIAGNOSIS — Z6832 Body mass index (BMI) 32.0-32.9, adult: Secondary | ICD-10-CM | POA: Diagnosis not present

## 2019-09-10 DIAGNOSIS — M8580 Other specified disorders of bone density and structure, unspecified site: Secondary | ICD-10-CM | POA: Diagnosis not present

## 2019-09-23 DIAGNOSIS — L720 Epidermal cyst: Secondary | ICD-10-CM | POA: Diagnosis not present

## 2019-11-11 DIAGNOSIS — Z8542 Personal history of malignant neoplasm of other parts of uterus: Secondary | ICD-10-CM | POA: Diagnosis not present

## 2019-11-11 DIAGNOSIS — R911 Solitary pulmonary nodule: Secondary | ICD-10-CM | POA: Diagnosis not present

## 2019-11-11 DIAGNOSIS — Z23 Encounter for immunization: Secondary | ICD-10-CM | POA: Diagnosis not present

## 2019-11-11 DIAGNOSIS — M8589 Other specified disorders of bone density and structure, multiple sites: Secondary | ICD-10-CM | POA: Diagnosis not present

## 2019-11-11 DIAGNOSIS — G620 Drug-induced polyneuropathy: Secondary | ICD-10-CM | POA: Diagnosis not present

## 2019-11-28 ENCOUNTER — Inpatient Hospital Stay: Payer: PPO | Attending: Gynecologic Oncology | Admitting: Gynecologic Oncology

## 2019-11-28 ENCOUNTER — Encounter: Payer: Self-pay | Admitting: Gynecologic Oncology

## 2019-11-28 ENCOUNTER — Other Ambulatory Visit: Payer: Self-pay

## 2019-11-28 VITALS — BP 122/66 | HR 68 | Temp 97.0°F | Resp 18 | Wt 202.6 lb

## 2019-11-28 DIAGNOSIS — Z90710 Acquired absence of both cervix and uterus: Secondary | ICD-10-CM | POA: Diagnosis not present

## 2019-11-28 DIAGNOSIS — K219 Gastro-esophageal reflux disease without esophagitis: Secondary | ICD-10-CM | POA: Diagnosis not present

## 2019-11-28 DIAGNOSIS — Z90722 Acquired absence of ovaries, bilateral: Secondary | ICD-10-CM | POA: Diagnosis not present

## 2019-11-28 DIAGNOSIS — Z9221 Personal history of antineoplastic chemotherapy: Secondary | ICD-10-CM | POA: Diagnosis not present

## 2019-11-28 DIAGNOSIS — E785 Hyperlipidemia, unspecified: Secondary | ICD-10-CM | POA: Diagnosis not present

## 2019-11-28 DIAGNOSIS — C541 Malignant neoplasm of endometrium: Secondary | ICD-10-CM

## 2019-11-28 DIAGNOSIS — Z79899 Other long term (current) drug therapy: Secondary | ICD-10-CM | POA: Insufficient documentation

## 2019-11-28 DIAGNOSIS — Z08 Encounter for follow-up examination after completed treatment for malignant neoplasm: Secondary | ICD-10-CM | POA: Diagnosis not present

## 2019-11-28 DIAGNOSIS — Z923 Personal history of irradiation: Secondary | ICD-10-CM | POA: Diagnosis not present

## 2019-11-28 DIAGNOSIS — Z8542 Personal history of malignant neoplasm of other parts of uterus: Secondary | ICD-10-CM | POA: Diagnosis not present

## 2019-11-28 DIAGNOSIS — M8580 Other specified disorders of bone density and structure, unspecified site: Secondary | ICD-10-CM | POA: Diagnosis not present

## 2019-11-28 DIAGNOSIS — Z7951 Long term (current) use of inhaled steroids: Secondary | ICD-10-CM | POA: Insufficient documentation

## 2019-11-28 NOTE — Patient Instructions (Signed)
Please notify Dr Denman George at phone number [phone] if you notice vaginal bleeding, new pelvic or abdominal pains, bloating, feeling full easy, or a change in bladder or bowel function.   Please contact Dr Serita Grit office (at [phone]) in January, 2022 to request an appointment with her for April, 2022.

## 2019-11-28 NOTE — Progress Notes (Signed)
GYN ONC OUTPATIENT VISIT   Erica Moyer 69 y.o. female  Chief Complaint:   Surveillance endometrial cancer  Assessment : History of recurrent endometrial carcinoma  No evidence for recurrent disease.  Plan:  NED since 01/2016 Follow-up with Gyn Onc in 6 months. Follow-up with Dr. Hinton Rao as scheduled  F/U with PCP reg immunizations   HPI:   Oncology History Overview Note  Erica Moyer reports having an episode of vaginal spotting in May 2016. She initially thought this was due to urethral irritation as she has bladder prolapse. She then continued to spot through the summer and fall of 2016 and was evaluated by Dr. Marvel Plan on 01/19/2015 at which time a transvaginal ultrasound scan was performed which revealed a normal size uterus measuring 7.4 x 8.4 x 3.7 cm with a thickened endometrial stripe 15 mm. The ovaries were grossly normal. She then underwent a endometrial biopsy on 01/28/2015 which revealed FIGO grade 2 moderately differentiated endometrioid adenocarcinoma   Endometrial cancer ([place])  01/28/2015 Initial Diagnosis   Endometrial cancer ([place]) FIGO grade 2 moderately differentiated endometrioid adenocarcinoma   02/25/2015 Surgery   robotic laparoscopic hysterectomy bilateral salpingo-oophorectomy and sentinel lymph nodes on    02/25/2015 Pathology Results    grade 3 endometrial cancer measuring 2.4 cm in diameter with superficial myometrial invasion. There was focal lymphatic vascular involvement. Lymph nodes and adnexa were negative for any metastatic disease.  Stage IA     - 06/11/2015 Radiation Therapy   Vaginal cuff brachytherapy   08/2015 Relapse/Recurrence    She was seen by Dr. Sofie Hartigan in mid July and at that time complained of increasing pelvic and lower back pain. CT scan was obtained on July 26 demonstrated what appears to be intraperitoneal disease including a left-sided metastatic site measuring 4.6 x 3.4 x 3 cm causing mass effect on the distal third  of the left ureter and moderate proximal hydronephrosis and has some mass effect on the sigmoid colon. There were 2 implants in the right hemipelvis measuring 3 x 2.2 and 2.9 x 2.3 cm.   CT Guided biopsy c/w recurrent endometrial cancer.    08/2015 - 01/15/2016 Chemotherapy   Received adjuvant taxol/carboplatin x 6   02/10/2016 Imaging   Imaging 02/10/2016 without evidence of metastatic carcinoma   05/08/2016 Imaging   Imaging 05/08/2016 without evidence of recurrent disease.   11/02/2016 Imaging   No evidence of metastatic disease.  Bilateral inguinal hernias containing fat   04/27/2017 Imaging   CT chest abdomen and pelvis No findings worrisome for metastatic disease in the chest abdomen or pelvis.  There is small pulmonary nodules bilaterally which are predominantly subpleural or perifissural all likely lymph nodes some of these were not previously imaged suggest attention on routine follow-up   05/01/2017 Miscellaneous   CA125    8.8   11/01/2017 Imaging   CT of the chest abdomen and pelvis with contrast Small 4 mm right upper lobe pulmonary nodule likely part of the apical pleural-parenchymal scarring Small type I hiatal hernia aortic atherosclerosis no findings of active or recurrent malignancy   04/29/2018 Imaging   CT of the abdomen and pelvis No evidence of recurrent or metastatic disease     Interval Hx:  She has no symptoms concerning for recurrence, or enduring toxicities of therapy. CT imaging with Dr Hinton Rao in  May 25th, 2021 showed no recurrence and tumor markers were benign.  Review of Systems: all negative  Vitals: Blood pressure 122/66, pulse 68, temperature (!) 97 F (  36.1 C), temperature source Tympanic, resp. rate 18, weight 202 lb 9.6 oz (91.9 kg), SpO2 100 %.  Wt Readings from Last 3 Encounters:  11/28/19 202 lb 9.6 oz (91.9 kg)  06/11/19 203 lb (92.1 kg)  04/25/19 204 lb 8 oz (92.8 kg)     Physical Exam: General : The patient is a healthy woman in no  acute distress, HEENT: normocephalic, extraoccular movements normal; Sclera nonicteric  Lymph nodes: Supraclavicular and inguinal nodes not enlarged  Abdomen: Soft, non-tender,obese,  no ascites, no organomegally, no masses, no hernias  Pelvic:  EGBUS: Normal female  Vagina: Normal, No discharge, tenderness or masses. Atrophic anterior and posterior vaginal wall prolapse. No excoriation. No masses on deep pelvic bimanual exam. Urethral caruncle.  Lower extremities: No edema or varicosities. Normal range of motion  Rectal: Good tone no masses no rectovaginal septum nodularity   Past Medical History:  Diagnosis Date   Cancer ([place])    endometrial cancer   Complication of anesthesia    slow to wake up   Frequency of urination    GERD (gastroesophageal reflux disease)    Headache    hx migraines   Hyperlipidemia    Neuropathy    Osteopenia    PONV (postoperative nausea and vomiting)    Radiation 05/19/15-06/11/15   vaginal cuff 30 Gy   Urgency incontinence     Past Surgical History:  Procedure Laterality Date   ABDOMINAL HYSTERECTOMY     CHOLECYSTECTOMY     ROBOTIC ASSISTED TOTAL HYSTERECTOMY WITH BILATERAL SALPINGO OOPHERECTOMY Bilateral 02/25/2015   Procedure: XI ROBOTIC ASSISTED TOTAL HYSTERECTOMY WITH BILATERAL SALPINGO OOPHORECTOMY WITH SENTINAL LYMPH NODE MAPPING;  Surgeon: [name], MD;  Location: [facility];  Service: Gynecology;  Laterality: Bilateral;   TUBAL LIGATION     1986    Current Outpatient Medications  Medication Sig Dispense Refill   calcium carbonate (TUMS - DOSED IN MG ELEMENTAL CALCIUM) 500 MG chewable tablet Chew 1 tablet by mouth 3 (three) times daily as needed for indigestion or heartburn. Reported on 05/19/2015     cetirizine (ZYRTEC) 10 MG tablet Take 10 mg by mouth daily.     gabapentin (NEURONTIN) 300 MG capsule Take 300 mg by mouth 3 (three) times daily.     ibuprofen (ADVIL,MOTRIN) 200 MG tablet Take 200 mg by mouth every 6 (six)  hours as needed (pain). Reported on 04/15/2015     OVER THE COUNTER MEDICATION Vitamin B6 100 mg tab daily     pantoprazole (PROTONIX) 40 MG tablet Take 1 tablet (40 mg total) by mouth daily. (Patient taking differently: Take 40 mg by mouth daily. Take 1/2 hour before supper) 90 tablet 4   Prenatal MV-Min-FA-Omega-3 (PRENATAL GUMMIES/DHA & FA PO) Take 2 tablets by mouth daily. Take 2 gummies. One a Day multivitamin     rosuvastatin (CRESTOR) 10 MG tablet Take 10 mg by mouth at bedtime.   3   mometasone (NASONEX) 50 MCG/ACT nasal spray Place 2 sprays into the nose daily as needed. (Patient not taking: Reported on 11/28/2019)     No current facility-administered medications for this visit.    Social History   Socioeconomic History   Marital status: Married    Spouse name: Not on file   Number of children: 1   Years of education: Not on file   Highest education level: Not on file  Occupational History   Occupation: retired Pharmacist, hospital  Tobacco Use   Smoking status: Never Smoker   Smokeless tobacco: Never Used  Vaping Use   Vaping Use: Never used  Substance and Sexual Activity   Alcohol use: No   Drug use: No   Sexual activity: Not Currently  Other Topics Concern   Not on file  Social History Narrative   Not on file   Social Determinants of Health   Financial Resource Strain:    Difficulty of Paying Living Expenses: Not on file  Food Insecurity:    Worried About [place] in the Last Year: Not on file   Ran Out of Food in the Last Year: Not on file  Transportation Needs:    Lack of Transportation (Medical): Not on file   Lack of Transportation (Non-Medical): Not on file  Physical Activity:    Days of Exercise per Week: Not on file   Minutes of Exercise per Session: Not on file  Stress:    Feeling of Stress : Not on file  Social Connections:    Frequency of Communication with Friends and Family: Not on file   Frequency of Social Gatherings  with Friends and Family: Not on file   Attends Religious Services: Not on file   Active Member of [place] or Organizations: Not on file   Attends Archivist Meetings: Not on file   Marital Status: Not on file  Intimate Partner Violence:    Fear of Current or Ex-Partner: Not on file   Emotionally Abused: Not on file   Physically Abused: Not on file   Sexually Abused: Not on file   Is not able to interact with her grandchildren because of COVID restrictions and this causes some sadness  Family History  Problem Relation Age of Onset   Anesthesia problems Mother    Hypertension Mother    Hypertension Father    Stroke Father    Cancer Paternal Grandmother        not sure what type    Thereasa Solo, MD

## 2020-03-10 NOTE — Progress Notes (Signed)
[facility]  [address] [place],  [place]  [address] [phone]  Clinic Day:  03/12/2020  Referring physician: [name], MD   This document serves as a record of services personally performed by Hosie Poisson, MD. It was created on their behalf by Curry,Lauren E, a trained medical scribe. The creation of this record is based on the scribe's personal observations and the provider's statements to them.   CHIEF COMPLAINT:  CC: History of stage IA endometrioid adenocarcinoma  Current Treatment:  Surveillance   HISTORY OF PRESENT ILLNESS:  Erica Moyer is a 70 y.o. female with a history of stage IA (T1 N0 M0) endometrioid adenocarcinoma.  She underwent robotic laparoscopic hysterectomy and bilateral salpingo-oophorectomy in January 2017.  Pathology revealed a 2.4 cm, grade 3, endometrioid adenocarcinoma with squamous differentiation.  There was superficial myometrial invasion.  Focal lymphovascular invasion was seen.  Seven nodes were negative for metastasis.  She received adjuvant radiotherapy completed in April 2017.   She developed abdominal pain and change in her stool caliber in June.  She saw Dr. Sondra Come in July 2017 and pelvic examination was normal.  CT abdomen and pelvis revealed recurrent endometrial carcinoma in the peritoneum with associated left hydronephrosis.  Biopsy in August revealed poorly differentiated carcinoma consistent with her endometrial primary.  She saw Dr. Fermin Schwab, who recommended palliative chemotherapy with carboplatin/paclitaxel.  We began seeing her in August 2017 to initiate chemotherapy.  She received 6 cycles of carboplatin/paclitaxel completed in December 2017 with an excellent response.  She had difficulty tolerating chemotherapy due to anorexia, nausea, dehydration, neuropathy and hypomagnesium.  She required IV and oral magnesium replacement.  Magnesium was subsequently discontinued.  She developed  peripheral neuropathy of the hands and feet and remains on gabapentin 300mg  three times daily and pyridoxine 100 mg daily.  Repeat imaging since completing chemotherapy has not revealed recurrent disease.  Colonoscopy in August 2015 was normal, so repeat 10 years was recommended.  Bone density scan in February 2019 revealed worsening osteopenia with a T-score of -1.7 in the spine and a T-score of -1.8 in the femur, for which she is on calcium and vitamin-D.  She saw Dr. Helene Kelp in May 2019 and was referred for left diagnostic mammogram and ultrasound to evaluate a possible breast mass.  There was no evidence of malignancy.  CT abdomen and pelvis in September revealed a stable left lower lobe lung nodule, without evidence of recurrent or metastatic disease.  Bilateral screening mammogram on January 2021 did not reveal any evidence of malignancy.  CT abdomen and pelvis from May 2021 revealed no findings to suggest residual or recurrent disease.  The 4 mm left lower lobe lung nodule remains stable.  CEA was normal at 0.5.  CA 125 was normal at 7.9.  She developed neuropathy secondary to chemotherapy, for which she is on gabapentin 300 mg twice daily.  INTERVAL HISTORY:  Erica Moyer is here for routine follow up and states that she has been well.  Her only complaint is frequent acid reflux.  She is due for annual mammogram which is being scheduled through Dr. Kennith Maes, and I do recommend 3D imaging.  Blood counts and chemistries are unremarkable.  Her  appetite is good, and she has gained 3 pounds since her last visit.  She denies fever, chills or other signs of infection.  She denies nausea, vomiting, bowel issues, or abdominal pain.  She denies sore throat, cough, dyspnea, or chest pain.  REVIEW  OF SYSTEMS:  Review of Systems  Constitutional: Negative.   HENT:  Negative.   Eyes: Negative.   Respiratory: Negative.   Cardiovascular: Negative.   Gastrointestinal:       Acid reflux, frequent  Endocrine:  Negative.   Genitourinary: Negative.    Musculoskeletal: Negative.   Skin: Negative.   Neurological: Negative.   Hematological: Negative.   Psychiatric/Behavioral: Negative.      VITALS:  Blood pressure (!) 147/80, pulse 82, resp. rate 18, height 5\' 7"  (1.702 m), weight 207 lb 1.6 oz (93.9 kg), SpO2 98 %.  Wt Readings from Last 3 Encounters:  03/12/20 207 lb 1.6 oz (93.9 kg)  11/28/19 202 lb 9.6 oz (91.9 kg)  06/11/19 203 lb (92.1 kg)    Body mass index is 32.44 kg/m.  Performance status (ECOG): 0 - Asymptomatic  PHYSICAL EXAM:  Physical Exam Constitutional:      General: She is not in acute distress.    Appearance: Normal appearance. She is normal weight.  HENT:     Head: Normocephalic and atraumatic.  Eyes:     General: No scleral icterus.    Extraocular Movements: Extraocular movements intact.     Conjunctiva/sclera: Conjunctivae normal.     Pupils: Pupils are equal, round, and reactive to light.  Cardiovascular:     Rate and Rhythm: Normal rate and regular rhythm.     Pulses: Normal pulses.     Heart sounds: Normal heart sounds. No murmur heard. No friction rub. No gallop.   Pulmonary:     Effort: Pulmonary effort is normal. No respiratory distress.     Breath sounds: Normal breath sounds.  Abdominal:     General: Bowel sounds are normal. There is no distension.     Palpations: Abdomen is soft. There is no hepatomegaly, splenomegaly or mass.     Tenderness: There is no abdominal tenderness.  Musculoskeletal:        General: Normal range of motion.     Cervical back: Normal range of motion and neck supple.     Right lower leg: No edema.     Left lower leg: No edema.  Lymphadenopathy:     Cervical: No cervical adenopathy.  Skin:    General: Skin is warm and dry.  Neurological:     General: No focal deficit present.     Mental Status: She is alert and oriented to person, place, and time. Mental status is at baseline.  Psychiatric:        Mood and Affect:  Mood normal.        Behavior: Behavior normal.        Thought Content: Thought content normal.        Judgment: Judgment normal.     LABS:   CBC Latest Ref Rng & Units 09/16/2015 08/25/2015 02/26/2015  WBC 4.0 - 10.5 K/uL 8.0 6.2 8.9  Hemoglobin 12.0 - 15.0 g/dL 13.6 13.1 12.5  Hematocrit 36.0 - 46.0 % 40.2 39.6 39.3  Platelets 150 - 400 K/uL 211 176 187   CMP Latest Ref Rng & Units 08/25/2015 02/26/2015 02/23/2015  Glucose 70 - 140 mg/dl 90 103(H) 103(H)  BUN 7.0 - 26.0 mg/dL 15.1 10 13   Creatinine 0.6 - 1.1 mg/dL 0.8 0.78 0.69  Sodium 136 - 145 mEq/L 143 144 144  Potassium 3.5 - 5.1 mEq/L 3.9 4.2 4.6  Chloride 101 - 111 mmol/L - 110 110  CO2 22 - 29 mEq/L 24 24 27   Calcium 8.4 - 10.4 mg/dL 9.6  9.1 9.8  Total Protein 6.5 - 8.1 g/dL - - 7.6  Total Bilirubin 0.3 - 1.2 mg/dL - - 0.9  Alkaline Phos 38 - 126 U/L - - 54  AST 15 - 41 U/L - - 22  ALT 14 - 54 U/L - - 23     No results found for: [id number]    STUDIES:  No results found.   Allergies: No Known Allergies  Current Medications: Current Outpatient Medications  Medication Sig Dispense Refill  . calcium carbonate (TUMS - DOSED IN MG ELEMENTAL CALCIUM) 500 MG chewable tablet Chew 1 tablet by mouth 3 (three) times daily as needed for indigestion or heartburn. Reported on 05/19/2015    . cetirizine (ZYRTEC) 10 MG tablet Take 10 mg by mouth daily.    Marland Kitchen gabapentin (NEURONTIN) 300 MG capsule Take 300 mg by mouth 3 (three) times daily.    Marland Kitchen ibuprofen (ADVIL,MOTRIN) 200 MG tablet Take 200 mg by mouth every 6 (six) hours as needed (pain). Reported on 04/15/2015    . mometasone (NASONEX) 50 MCG/ACT nasal spray Place 2 sprays into the nose daily as needed. (Patient not taking: Reported on 11/28/2019)    . OVER THE COUNTER MEDICATION Vitamin B6 100 mg tab daily    . pantoprazole (PROTONIX) 40 MG tablet Take 1 tablet (40 mg total) by mouth daily. (Patient taking differently: Take 40 mg by mouth daily. Take 1/2 hour before supper) 90 tablet 4   . Prenatal MV-Min-FA-Omega-3 (PRENATAL GUMMIES/DHA & FA PO) Take 2 tablets by mouth daily. Take 2 gummies. One a Day multivitamin    . rosuvastatin (CRESTOR) 10 MG tablet Take 10 mg by mouth at bedtime.   3   No current facility-administered medications for this visit.     ASSESSMENT & PLAN:   Assessment:   1. History of recurrent endometrial cancer in the peritoneum treated with chemotherapy with a complete response.  She remains without evidence of disease now over 4 years off therapy.    2. Osteopenia, for which she is on calcium and vitamin-D.  She is up to date on bone density scan in February 2021 and has these done at Dr. Greggory Keen office.    3. Peripheral neuropathy secondary to chemotherapy.      4. Lung nodule of the left lower lobe, which measures 4 mm and remains stable on last CT imaging.  Plan: We will plan to see her back in 3 months with a CBC, comprehensive metabolic panel, magnesium, CA 125, CEA and CT abdomen and pelvis for reevaluation.  If all is well, we can see her back every 6 months.  Dr. Kennith Maes will be scheduling her annual mammogram.  The patient understands the plans discussed today and is in agreement with them.  She knows to contact our office if she develops concerns prior to her next appointment.   I provided 20 minutes of face-to-face time during this this encounter and > 50% was spent counseling as documented under my assessment and plan.    Derwood Kaplan, MD [facility] [address] [place] [address] Dept: [phone] Dept Fax: [phone]   I, Rita Ohara, am acting as scribe for Derwood Kaplan, MD  I have reviewed this report as typed by the medical scribe, and it is complete and accurate.

## 2020-03-11 ENCOUNTER — Other Ambulatory Visit: Payer: Self-pay | Admitting: Hematology and Oncology

## 2020-03-11 ENCOUNTER — Telehealth: Payer: Self-pay | Admitting: *Deleted

## 2020-03-11 DIAGNOSIS — C541 Malignant neoplasm of endometrium: Secondary | ICD-10-CM

## 2020-03-11 NOTE — Telephone Encounter (Signed)
Patient called and scheduled a follow up appt for April

## 2020-03-12 ENCOUNTER — Inpatient Hospital Stay: Payer: PPO | Attending: Oncology

## 2020-03-12 ENCOUNTER — Other Ambulatory Visit: Payer: Self-pay

## 2020-03-12 ENCOUNTER — Other Ambulatory Visit: Payer: Self-pay | Admitting: Oncology

## 2020-03-12 ENCOUNTER — Telehealth: Payer: Self-pay | Admitting: Oncology

## 2020-03-12 ENCOUNTER — Encounter: Payer: Self-pay | Admitting: Oncology

## 2020-03-12 ENCOUNTER — Other Ambulatory Visit: Payer: Self-pay | Admitting: Hematology and Oncology

## 2020-03-12 ENCOUNTER — Inpatient Hospital Stay (INDEPENDENT_AMBULATORY_CARE_PROVIDER_SITE_OTHER): Payer: PPO | Admitting: Oncology

## 2020-03-12 VITALS — BP 147/80 | HR 82 | Resp 18 | Ht 67.0 in | Wt 207.1 lb

## 2020-03-12 DIAGNOSIS — C541 Malignant neoplasm of endometrium: Secondary | ICD-10-CM

## 2020-03-12 DIAGNOSIS — Z8542 Personal history of malignant neoplasm of other parts of uterus: Secondary | ICD-10-CM | POA: Diagnosis not present

## 2020-03-12 DIAGNOSIS — G620 Drug-induced polyneuropathy: Secondary | ICD-10-CM | POA: Insufficient documentation

## 2020-03-12 DIAGNOSIS — M85859 Other specified disorders of bone density and structure, unspecified thigh: Secondary | ICD-10-CM | POA: Diagnosis not present

## 2020-03-12 DIAGNOSIS — R911 Solitary pulmonary nodule: Secondary | ICD-10-CM | POA: Insufficient documentation

## 2020-03-12 DIAGNOSIS — D649 Anemia, unspecified: Secondary | ICD-10-CM | POA: Diagnosis not present

## 2020-03-12 DIAGNOSIS — Z90722 Acquired absence of ovaries, bilateral: Secondary | ICD-10-CM | POA: Diagnosis not present

## 2020-03-12 DIAGNOSIS — Z9221 Personal history of antineoplastic chemotherapy: Secondary | ICD-10-CM | POA: Diagnosis not present

## 2020-03-12 DIAGNOSIS — Z79899 Other long term (current) drug therapy: Secondary | ICD-10-CM | POA: Diagnosis not present

## 2020-03-12 LAB — CBC AND DIFFERENTIAL
HCT: 41 (ref 36–46)
Hemoglobin: 13.7 (ref 12.0–16.0)
Neutrophils Absolute: 2.77
Platelets: 152 (ref 150–399)
WBC: 4.7

## 2020-03-12 LAB — COMPREHENSIVE METABOLIC PANEL
Albumin: 4.5 (ref 3.5–5.0)
Calcium: 9.7 (ref 8.7–10.7)

## 2020-03-12 LAB — BASIC METABOLIC PANEL
BUN: 14 (ref 4–21)
CO2: 27 — AB (ref 13–22)
Chloride: 105 (ref 99–108)
Creatinine: 0.8 (ref 0.5–1.1)
Glucose: 99
Potassium: 4.2 (ref 3.4–5.3)
Sodium: 139 (ref 137–147)

## 2020-03-12 LAB — HEPATIC FUNCTION PANEL
ALT: 28 (ref 7–35)
AST: 29 (ref 13–35)
Alkaline Phosphatase: 71 (ref 25–125)
Bilirubin, Total: 0.7

## 2020-03-12 LAB — CBC: RBC: 4.81 (ref 3.87–5.11)

## 2020-03-12 NOTE — Telephone Encounter (Signed)
Per 1/28 LOS, Patient scheduled for 5/27 Labs, CT Scans - 5/28 Follow Up.  Gave patient Appt Summary/Orders/Instructions

## 2020-03-13 LAB — CEA: CEA: 0.3 ng/mL (ref 0.0–4.7)

## 2020-03-16 ENCOUNTER — Telehealth: Payer: Self-pay | Admitting: *Deleted

## 2020-03-16 NOTE — Telephone Encounter (Signed)
Pt had pfizer vaccine 1st dose 03-02-2019, 2nd dose 03[facility]22-2019 [facility] [place] [facility] and [place] booster on 11-21-2019 also at [employer] [facility]

## 2020-03-22 ENCOUNTER — Telehealth: Payer: Self-pay

## 2020-03-22 NOTE — Telephone Encounter (Addendum)
Pt notified of below. She decided to just wait get tests later since Dr Hinton Rao doesn't think it is necessary to have drawn right now.  ----- Message from Derwood Kaplan, MD sent at 03/19/2020  5:15 PM EST ----- Regarding: RE: LAB RESULTS So tell pt rest of labs normal but CA 125 not done, will check next time when we get scans (unless she wants to come back for re-draw, I don't think it is necessary ----- Message ----- From: Dairl Ponder, RN Sent: 03/19/2020   2:18 PM EST To: Derwood Kaplan, MD Subject: RE: LAB RESULTS                                Per Isaias Sakai ---hey looking back at the orders looks like she added it on after blood was drawn and we didn't have enough blood to send off   ----- Message ----- From: Derwood Kaplan, MD Sent: 03/19/2020   1:43 PM EST To: Dairl Ponder, RN Subject: RE: LAB RESULTS                                CEA is normal, did they do the CA125? ----- Message ----- From: Dairl Ponder, RN Sent: 03/19/2020   1:34 PM EST To: Derwood Kaplan, MD Subject: LAB RESULTS                                    Pt has called to request lab results. [phone]

## 2020-03-24 DIAGNOSIS — Z1231 Encounter for screening mammogram for malignant neoplasm of breast: Secondary | ICD-10-CM | POA: Diagnosis not present

## 2020-05-21 ENCOUNTER — Ambulatory Visit (INDEPENDENT_AMBULATORY_CARE_PROVIDER_SITE_OTHER): Payer: PPO | Admitting: Gastroenterology

## 2020-05-21 ENCOUNTER — Other Ambulatory Visit: Payer: Self-pay

## 2020-05-21 ENCOUNTER — Encounter: Payer: Self-pay | Admitting: Gastroenterology

## 2020-05-21 VITALS — BP 138/80 | HR 54 | Ht 67.0 in | Wt 207.4 lb

## 2020-05-21 DIAGNOSIS — R1319 Other dysphagia: Secondary | ICD-10-CM | POA: Diagnosis not present

## 2020-05-21 DIAGNOSIS — K219 Gastro-esophageal reflux disease without esophagitis: Secondary | ICD-10-CM

## 2020-05-21 MED ORDER — PANTOPRAZOLE SODIUM 40 MG PO TBEC: 40.0000 mg | DELAYED_RELEASE_TABLET | Freq: Every day | ORAL | 4 refills | Status: DC

## 2020-05-21 NOTE — Progress Notes (Signed)
Chief Complaint: Dysphagia  Referring Provider:  [name], MD      ASSESSMENT AND PLAN;    #1. GERD with eso dysphagia.  Reluctant to do EGD at this time. Ba swallow 04/2019- small HH, otherwise Nl  #2.  Endometrial adenocarcinoma s/p robotic TAH with BSO 02/2015 followed by RT with recurrence of peritoneum 08/2015 treated with 6 cycles of carboplatin/paclitaxel neg CT abdo/pelvis 04/2018. Neg colon 09/2013  Plan: - Protonix 40 mg p.o. QD, 1/2 hr before supper #90  4 refills. - FU with Dr Hinton Rao. Getting CT AP May 2022 - FU PRN    HPI:    Erica Moyer is a 70 y.o. female  Erica Moyer's mother For follow-up visit.  Doing very well from GI standpoint.  No further dysphagia.  She underwent barium swallow which showed small hiatal hernia.  No strictures.  She would like to hold off on EGD.  Here for medication refill-Protonix.  Alt diarrhea and constipation, Dx with IBS in 2005 No melena or hematochezia.  Review of labs from 04/29/2018-hemoglobin 13.7, MCV 88, platelets 144, WBC count 4.7, CA 125, CEA 0.6.  Normal CMP with BUN/creatinine 13/0.7.  Normal LFTs.  Magnesium 1.9.  Past GI procedures: -Colonoscopy 09/2013 ([facility])- neg. -CT Abdo[facility]pelvis 04/29/2018 at [facility] negative for any recurrence.  Normal liver, status post cholecystectomy, normal pancreas, spleen mild scarring in the right upper kidney.  Otherwise unremarkable. Past Medical History:  Diagnosis Date  . Cancer [facility])    endometrial cancer  . Complication of anesthesia    slow to wake up  . Frequency of urination   . GERD (gastroesophageal reflux disease)   . Headache    hx migraines  . Hyperlipidemia   . Neuropathy   . Osteopenia   . PONV (postoperative nausea and vomiting)   . Radiation 05/19/15-06/11/15   vaginal cuff 30 Gy  . Urgency incontinence     Past Surgical History:  Procedure Laterality Date  . ABDOMINAL HYSTERECTOMY    . CHOLECYSTECTOMY    . ROBOTIC ASSISTED  TOTAL HYSTERECTOMY WITH BILATERAL SALPINGO OOPHERECTOMY Bilateral 02/25/2015   Procedure: XI ROBOTIC ASSISTED TOTAL HYSTERECTOMY WITH BILATERAL SALPINGO OOPHORECTOMY WITH SENTINAL LYMPH NODE MAPPING;  Surgeon: [name], MD;  Location: [facility];  Service: Gynecology;  Laterality: Bilateral;  . TUBAL LIGATION     1986    Family History  Problem Relation Age of Onset  . Anesthesia problems Mother   . Hypertension Mother   . Hypertension Father   . Stroke Father   . Cancer Paternal Grandmother        not sure what type    Social History   Tobacco Use  . Smoking status: Never Smoker  . Smokeless tobacco: Never Used  Vaping Use  . Vaping Use: Never used  Substance Use Topics  . Alcohol use: No  . Drug use: No    Current Outpatient Medications  Medication Sig Dispense Refill  . calcium carbonate (TUMS - DOSED IN MG ELEMENTAL CALCIUM) 500 MG chewable tablet Chew 1 tablet by mouth 3 (three) times daily as needed for indigestion or heartburn. Reported on 05/19/2015    . cetirizine (ZYRTEC) 10 MG tablet Take 10 mg by mouth daily.    Marland Kitchen gabapentin (NEURONTIN) 300 MG capsule Take 300 mg by mouth 3 (three) times daily.    Marland Kitchen ibuprofen (ADVIL,MOTRIN) 200 MG tablet Take 200 mg by mouth every 6 (six) hours as needed (pain). Reported on 04/15/2015    .  mometasone (NASONEX) 50 MCG/ACT nasal spray Place 2 sprays into the nose daily as needed.    Marland Kitchen OVER THE COUNTER MEDICATION Vitamin B6 100 mg tab daily    . pantoprazole (PROTONIX) 40 MG tablet Take 1 tablet (40 mg total) by mouth daily. (Patient taking differently: Take 40 mg by mouth daily. Take 1/2 hour before supper) 90 tablet 4  . Prenatal MV-Min-FA-Omega-3 (PRENATAL GUMMIES/DHA & FA PO) Take 2 tablets by mouth daily. Take 2 gummies. One a Day multivitamin    . rosuvastatin (CRESTOR) 10 MG tablet Take 10 mg by mouth at bedtime.   3   No current facility-administered medications for this visit.    No Known Allergies  Review of Systems:   Constitutional: Denies fever, chills, diaphoresis, appetite change and fatigue.  HEENT: Denies photophobia, eye pain, redness, hearing loss, ear pain, congestion, sore throat, rhinorrhea, sneezing, mouth sores, neck pain, neck stiffness and tinnitus.   Respiratory: Denies SOB, DOE, cough, chest tightness,  and wheezing.   Cardiovascular: Denies chest pain, palpitations and leg swelling.  Genitourinary: Denies dysuria, urgency, frequency, hematuria, flank pain and difficulty urinating.  Musculoskeletal: Denies myalgias, back pain, joint swelling, arthralgias and gait problem.  Skin: No rash.  Neurological: Denies dizziness, seizures, syncope, weakness, light-headedness, numbness and headaches.  Hematological: Denies adenopathy. Easy bruising, personal or family bleeding history  Psychiatric/Behavioral: has anxiety or depression     Physical Exam:    BP 138/80 (BP Location: Right Arm, Patient Position: Sitting, Cuff Size: Normal)   Pulse (!) 54   Ht 5\' 7"  (1.702 m)   Wt 207 lb 6 oz (94.1 kg)   BMI 32.48 kg/m  Filed Weights   05/21/20 1104  Weight: 207 lb 6 oz (94.1 kg)  HEENT: No jaundice, pallor. Resp: Bilaterally clear CVS: S1-S2 normal no S3 or S4. Abdomen: Soft nontender bowel sounds present no definite hepatosplenomegaly.  Data Reviewed: I have personally reviewed following labs and imaging studies  CBC: CBC Latest Ref Rng & Units 03/12/2020 09/16/2015 08/25/2015  WBC - 4.7 8.0 6.2  Hemoglobin 12.0 - 16.0 13.7 13.6 13.1  Hematocrit 36 - 46 41 40.2 39.6  Platelets 150 - 399 152 211 176    CMP: CMP Latest Ref Rng & Units 03/12/2020 08/25/2015 02/26/2015  Glucose 70 - 140 mg/dl - 90 103(H)  BUN 4 - 21 14 15.1 10  Creatinine 0.5 - 1.1 0.8 0.8 0.78  Sodium 137 - 147 139 143 144  Potassium 3.4 - 5.3 4.2 3.9 4.2  Chloride 99 - 108 105 - 110  CO2 13 - 22 27(A) 24 24  Calcium 8.7 - 10.7 9.7 9.6 9.1  Total Protein 6.5 - 8.1 g/dL - - -  Total Bilirubin 0.3 - 1.2 mg/dL - - -   Alkaline Phos 25 - 125 71 - -  AST 13 - 35 29 - -  ALT 7 - 35 28 - -       Carmell Austria, MD 05/21/2020, 11:37 AM  Cc: [name], MD

## 2020-05-21 NOTE — Patient Instructions (Signed)
If you are age 72 or older, your body mass index should be between 23-30. Your Body mass index is 32.48 kg/m. If this is out of the aforementioned range listed, please consider follow up with your Primary Care Provider.  If you are age 12 or younger, your body mass index should be between 19-25. Your Body mass index is 32.48 kg/m. If this is out of the aformentioned range listed, please consider follow up with your Primary Care Provider.   We have sent the following medications to your pharmacy for you to pick up at your convenience: Protonix  Keep appointment with Dr Hinton Rao.  Thank you,  Dr. Jackquline Denmark

## 2020-06-07 ENCOUNTER — Encounter: Payer: Self-pay | Admitting: Gynecologic Oncology

## 2020-06-08 ENCOUNTER — Other Ambulatory Visit: Payer: Self-pay

## 2020-06-08 ENCOUNTER — Inpatient Hospital Stay: Payer: PPO | Attending: Gynecologic Oncology | Admitting: Gynecologic Oncology

## 2020-06-08 VITALS — BP 119/61 | HR 67 | Temp 97.9°F | Resp 15 | Ht 67.0 in | Wt 206.8 lb

## 2020-06-08 DIAGNOSIS — K219 Gastro-esophageal reflux disease without esophagitis: Secondary | ICD-10-CM | POA: Insufficient documentation

## 2020-06-08 DIAGNOSIS — E785 Hyperlipidemia, unspecified: Secondary | ICD-10-CM | POA: Insufficient documentation

## 2020-06-08 DIAGNOSIS — Z9221 Personal history of antineoplastic chemotherapy: Secondary | ICD-10-CM | POA: Insufficient documentation

## 2020-06-08 DIAGNOSIS — Z90710 Acquired absence of both cervix and uterus: Secondary | ICD-10-CM | POA: Insufficient documentation

## 2020-06-08 DIAGNOSIS — Z8542 Personal history of malignant neoplasm of other parts of uterus: Secondary | ICD-10-CM | POA: Diagnosis not present

## 2020-06-08 DIAGNOSIS — Z90722 Acquired absence of ovaries, bilateral: Secondary | ICD-10-CM | POA: Diagnosis not present

## 2020-06-08 DIAGNOSIS — Z79899 Other long term (current) drug therapy: Secondary | ICD-10-CM | POA: Diagnosis not present

## 2020-06-08 DIAGNOSIS — Z923 Personal history of irradiation: Secondary | ICD-10-CM | POA: Diagnosis not present

## 2020-06-08 DIAGNOSIS — M8580 Other specified disorders of bone density and structure, unspecified site: Secondary | ICD-10-CM | POA: Diagnosis not present

## 2020-06-08 DIAGNOSIS — C541 Malignant neoplasm of endometrium: Secondary | ICD-10-CM

## 2020-06-08 DIAGNOSIS — Z08 Encounter for follow-up examination after completed treatment for malignant neoplasm: Secondary | ICD-10-CM | POA: Insufficient documentation

## 2020-06-08 NOTE — Patient Instructions (Signed)
Please notify Dr Denman George at phone number [phone] if you notice vaginal bleeding, new pelvic or abdominal pains, bloating, feeling full easy, or a change in bladder or bowel function.   Please contact Dr Serita Grit office (at [phone]) in July to request an appointment with her for October, 2022.

## 2020-06-08 NOTE — Progress Notes (Signed)
GYN ONC OUTPATIENT VISIT   Erica Moyer 70 y.o. female  Chief Complaint:   Surveillance endometrial cancer  Assessment : History of recurrent endometrial carcinoma  No evidence for recurrent disease.  Plan:  NED since 01/2016 Follow-up with Gyn Onc in 6 months. Follow-up with Dr. Hinton Rao as scheduled  F/U with PCP reg immunizations   HPI:   Oncology History Overview Note  Erica Moyer reports having an episode of vaginal spotting in May 2016. She initially thought this was due to urethral irritation as she has bladder prolapse. She then continued to spot through the summer and fall of 2016 and was evaluated by Dr. Marvel Plan on 01/19/2015 at which time a transvaginal ultrasound scan was performed which revealed a normal size uterus measuring 7.4 x 8.4 x 3.7 cm with a thickened endometrial stripe 15 mm. The ovaries were grossly normal. She then underwent a endometrial biopsy on 01/28/2015 which revealed FIGO grade 2 moderately differentiated endometrioid adenocarcinoma   Endometrial cancer ([place])  01/28/2015 Initial Diagnosis   Endometrial cancer ([place]) FIGO grade 2 moderately differentiated endometrioid adenocarcinoma   02/25/2015 Surgery   robotic laparoscopic hysterectomy bilateral salpingo-oophorectomy and sentinel lymph nodes on    02/25/2015 Pathology Results    grade 3 endometrial cancer measuring 2.4 cm in diameter with superficial myometrial invasion. There was focal lymphatic vascular involvement. Lymph nodes and adnexa were negative for any metastatic disease.  Stage IA     - 06/11/2015 Radiation Therapy   Vaginal cuff brachytherapy   08/2015 Relapse/Recurrence    She was seen by Dr. Sofie Hartigan in mid July and at that time complained of increasing pelvic and lower back pain. CT scan was obtained on July 26 demonstrated what appears to be intraperitoneal disease including a left-sided metastatic site measuring 4.6 x 3.4 x 3 cm causing mass effect on the distal third  of the left ureter and moderate proximal hydronephrosis and has some mass effect on the sigmoid colon. There were 2 implants in the right hemipelvis measuring 3 x 2.2 and 2.9 x 2.3 cm.   CT Guided biopsy c/w recurrent endometrial cancer.    08/2015 - 01/15/2016 Chemotherapy   Received adjuvant taxol/carboplatin x 6   02/10/2016 Imaging   Imaging 02/10/2016 without evidence of metastatic carcinoma   05/08/2016 Imaging   Imaging 05/08/2016 without evidence of recurrent disease.   11/02/2016 Imaging   No evidence of metastatic disease.  Bilateral inguinal hernias containing fat   04/27/2017 Imaging   CT chest abdomen and pelvis No findings worrisome for metastatic disease in the chest abdomen or pelvis.  There is small pulmonary nodules bilaterally which are predominantly subpleural or perifissural all likely lymph nodes some of these were not previously imaged suggest attention on routine follow-up   05/01/2017 Miscellaneous   CA125    8.8   11/01/2017 Imaging   CT of the chest abdomen and pelvis with contrast Small 4 mm right upper lobe pulmonary nodule likely part of the apical pleural-parenchymal scarring Small type I hiatal hernia aortic atherosclerosis no findings of active or recurrent malignancy   04/29/2018 Imaging   CT of the abdomen and pelvis No evidence of recurrent or metastatic disease     Interval Hx:  She has no symptoms concerning for recurrence, or enduring toxicities of therapy. CT imaging with Dr Hinton Rao in  May 25th, 2021 showed no recurrence and tumor markers were benign.  Review of Systems: all negative  Vitals: Blood pressure 119/61, pulse 67, temperature 97.9 F (36.6  C), temperature source Tympanic, resp. rate 15, height 5\' 7"  (1.702 m), weight 206 lb 12.8 oz (93.8 kg), SpO2 100 %.  Wt Readings from Last 3 Encounters:  06/08/20 206 lb 12.8 oz (93.8 kg)  05/21/20 207 lb 6 oz (94.1 kg)  03/12/20 207 lb 1.6 oz (93.9 kg)     Physical Exam: General : The  patient is a healthy woman in no acute distress, HEENT: normocephalic, extraoccular movements normal; Sclera nonicteric  Lymph nodes: Supraclavicular and inguinal nodes not enlarged  Abdomen: Soft, non-tender,obese,  no ascites, no organomegally, no masses, no hernias  Pelvic:  EGBUS: Normal female  Vagina: Normal, No discharge, tenderness or masses. Atrophic anterior and posterior vaginal wall prolapse. No excoriation. No masses on deep pelvic bimanual exam. Urethral caruncle.  Lower extremities: No edema or varicosities. Normal range of motion  Rectal: Good tone no masses no rectovaginal septum nodularity   Past Medical History:  Diagnosis Date  . Cancer [facility])    endometrial cancer  . Complication of anesthesia    slow to wake up  . Frequency of urination   . GERD (gastroesophageal reflux disease)   . Headache    hx migraines  . Hyperlipidemia   . Neuropathy   . Osteopenia   . PONV (postoperative nausea and vomiting)   . Radiation 05/19/15-06/11/15   vaginal cuff 30 Gy  . Urgency incontinence     Past Surgical History:  Procedure Laterality Date  . ABDOMINAL HYSTERECTOMY    . CHOLECYSTECTOMY    . ROBOTIC ASSISTED TOTAL HYSTERECTOMY WITH BILATERAL SALPINGO OOPHERECTOMY Bilateral 02/25/2015   Procedure: XI ROBOTIC ASSISTED TOTAL HYSTERECTOMY WITH BILATERAL SALPINGO OOPHORECTOMY WITH SENTINAL LYMPH NODE MAPPING;  Surgeon: [name], Erica Moyer;  Location: [facility];  Service: Gynecology;  Laterality: Bilateral;  . TUBAL LIGATION     1986    Current Outpatient Medications  Medication Sig Dispense Refill  . calcium carbonate (TUMS - DOSED IN MG ELEMENTAL CALCIUM) 500 MG chewable tablet Chew 1 tablet by mouth 3 (three) times daily as needed for indigestion or heartburn. Reported on 05/19/2015    . cetirizine (ZYRTEC) 10 MG tablet Take 10 mg by mouth daily.    Marland Kitchen gabapentin (NEURONTIN) 300 MG capsule Take 300 mg by mouth 3 (three) times daily.    Marland Kitchen OVER THE COUNTER MEDICATION Vitamin B6  100 mg tab daily    . pantoprazole (PROTONIX) 40 MG tablet Take 1 tablet (40 mg total) by mouth daily. Take 1/2 hour before supper 90 tablet 4  . Prenatal MV-Min-FA-Omega-3 (PRENATAL GUMMIES/DHA & FA PO) Take 2 tablets by mouth daily. Take 2 gummies. One a Day multivitamin    . rosuvastatin (CRESTOR) 10 MG tablet Take 10 mg by mouth at bedtime.   3  . ibuprofen (ADVIL,MOTRIN) 200 MG tablet Take 200 mg by mouth every 6 (six) hours as needed (pain). Reported on 04/15/2015 (Patient not taking: Reported on 06/07/2020)    . mometasone (NASONEX) 50 MCG/ACT nasal spray Place 2 sprays into the nose daily as needed. (Patient not taking: Reported on 06/07/2020)     No current facility-administered medications for this visit.    Social History   Socioeconomic History  . Marital status: Married    Spouse name: Not on file  . Number of children: 1  . Years of education: Not on file  . Highest education level: Not on file  Occupational History  . Occupation: retired Pharmacist, hospital  Tobacco Use  . Smoking status: Never Smoker  . Smokeless  tobacco: Never Used  Vaping Use  . Vaping Use: Never used  Substance and Sexual Activity  . Alcohol use: No  . Drug use: No  . Sexual activity: Not Currently  Other Topics Concern  . Not on file  Social History Narrative  . Not on file   Social Determinants of Health   Financial Resource Strain: Not on file  Food Insecurity: Not on file  Transportation Needs: Not on file  Physical Activity: Not on file  Stress: Not on file  Social Connections: Not on file  Intimate Partner Violence: Not on file   Is not able to interact with her grandchildren because of COVID restrictions and this causes some sadness  Family History  Problem Relation Age of Onset  . Anesthesia problems Mother   . Hypertension Mother   . Hypertension Father   . Stroke Father   . Cancer Paternal Grandmother        not sure what type    Erica Solo, Erica Moyer

## 2020-07-07 ENCOUNTER — Other Ambulatory Visit: Payer: PPO

## 2020-07-07 ENCOUNTER — Encounter: Payer: Self-pay | Admitting: Hematology and Oncology

## 2020-07-07 ENCOUNTER — Inpatient Hospital Stay: Payer: PPO | Attending: Oncology

## 2020-07-07 ENCOUNTER — Other Ambulatory Visit: Payer: Self-pay

## 2020-07-07 DIAGNOSIS — D649 Anemia, unspecified: Secondary | ICD-10-CM | POA: Diagnosis not present

## 2020-07-07 DIAGNOSIS — C786 Secondary malignant neoplasm of retroperitoneum and peritoneum: Secondary | ICD-10-CM | POA: Diagnosis not present

## 2020-07-07 DIAGNOSIS — R970 Elevated carcinoembryonic antigen [CEA]: Secondary | ICD-10-CM | POA: Diagnosis not present

## 2020-07-07 DIAGNOSIS — C541 Malignant neoplasm of endometrium: Secondary | ICD-10-CM

## 2020-07-07 DIAGNOSIS — K760 Fatty (change of) liver, not elsewhere classified: Secondary | ICD-10-CM | POA: Diagnosis not present

## 2020-07-07 DIAGNOSIS — C55 Malignant neoplasm of uterus, part unspecified: Secondary | ICD-10-CM | POA: Diagnosis not present

## 2020-07-07 LAB — CBC AND DIFFERENTIAL
HCT: 42 (ref 36–46)
Hemoglobin: 13.9 (ref 12.0–16.0)
Neutrophils Absolute: 2.41
Platelets: 147 — AB (ref 150–399)
WBC: 4.3

## 2020-07-07 LAB — BASIC METABOLIC PANEL
BUN: 14 (ref 4–21)
CO2: 25 — AB (ref 13–22)
Chloride: 106 (ref 99–108)
Creatinine: 0.9 (ref 0.5–1.1)
Glucose: 101
Potassium: 3.9 (ref 3.4–5.3)
Sodium: 141 (ref 137–147)

## 2020-07-07 LAB — CBC: RBC: 4.87 (ref 3.87–5.11)

## 2020-07-07 LAB — HEPATIC FUNCTION PANEL
ALT: 33 (ref 7–35)
AST: 35 (ref 13–35)
Alkaline Phosphatase: 69 (ref 25–125)
Bilirubin, Total: 0.8

## 2020-07-07 LAB — COMPREHENSIVE METABOLIC PANEL
Albumin: 4.5 (ref 3.5–5.0)
Calcium: 9.2 (ref 8.7–10.7)

## 2020-07-07 NOTE — Progress Notes (Signed)
Pt came in to have port accessed and labs drawn before CT scan upstairs. Port accessed per protocol. The port a cath +blood return,&  flushes easily. I could not get enough blood drawn back for labs, so pt required peripheral stick by phlebotomist.

## 2020-07-09 ENCOUNTER — Encounter: Payer: Self-pay | Admitting: Hematology and Oncology

## 2020-07-09 ENCOUNTER — Telehealth: Payer: Self-pay | Admitting: Hematology and Oncology

## 2020-07-09 ENCOUNTER — Inpatient Hospital Stay (INDEPENDENT_AMBULATORY_CARE_PROVIDER_SITE_OTHER): Payer: PPO | Admitting: Hematology and Oncology

## 2020-07-09 ENCOUNTER — Other Ambulatory Visit: Payer: Self-pay

## 2020-07-09 VITALS — BP 137/68 | HR 70 | Temp 98.4°F | Resp 18 | Ht 67.0 in | Wt 206.8 lb

## 2020-07-09 DIAGNOSIS — C541 Malignant neoplasm of endometrium: Secondary | ICD-10-CM | POA: Diagnosis not present

## 2020-07-09 DIAGNOSIS — Z789 Other specified health status: Secondary | ICD-10-CM | POA: Diagnosis not present

## 2020-07-09 NOTE — Progress Notes (Signed)
[facility] [facility]  [address] [place],    [address] [phone]  Clinic Day:  07/09/2020  Referring physician: [name], MD   CHIEF COMPLAINT:  CC:    Recurrent endometrial cancer  Current Treatment:   Observation    HISTORY OF PRESENT ILLNESS:  Erica Moyer is a 70 y.o. female with stage IA (T1 N0 M0) endometrioid adenocarcinoma treated with robotic laparoscopic hysterectomy and bilateral salpingo-oophorectomy in January 2017.  Pathology revealed a 2.4 cm, grade 3, endometrioid adenocarcinoma with squamous differentiation.  There was superficial myometrial invasion.  Focal lymphovascular invasion was seen.  Seven nodes were negative for metastasis.  She received adjuvant radiotherapy completed in April 2017.   She developed abdominal pain and change in her stool caliber in June.  She saw Dr. Sondra Come in July 2017 and pelvic examination was normal.  CT abdomen and pelvis revealed recurrent endometrial carcinoma in the peritoneum with associated left hydronephrosis.  Biopsy in August revealed poorly differentiated carcinoma consistent with her endometrial primary.  She saw Dr. Fermin Schwab, who recommended palliative chemotherapy with carboplatin/paclitaxel.  We began seeing her in August 2017 to initiate chemotherapy.  She received 6 cycles of carboplatin/paclitaxel completed in December 2017 with an excellent response.  She had difficulty tolerating chemotherapy due to anorexia, nausea, dehydration, neuropathy and hypomagnesemia.  She required IV and oral magnesium replacement.  Magnesium was subsequently discontinued.  She developed peripheral neuropathy of the hands and feet and remains on gabapentin 300 mg three times daily and pyridoxine 100 mg daily.  Repeat imaging and tumor markers have remained normal since completing chemotherapy has not revealed recurrent disease.  Colonoscopy in August 2015 was normal, so repeat 10 years was  recommended.    Bone density scan in February 2019 revealed worsening osteopenia with a T-score of -1.7 in the spine and a T-score of -1.8 in the femur, for which she is on calcium and vitamin-D.  In May 2019, Dr. Helene Kelp ordered left diagnostic mammogram and ultrasound for a possible breast mass, which did not reveal any evidence of malignancy.  CT abdomen and pelvis in September 2019 revealed a stable left lower lobe lung nodule without evidence of recurrent or metastatic disease. Bilateral screening mammogram in January 2020 did not reveal any evidence of malignancy.  CT abdomen and pelvis in March and September 2020 remained without evidence of recurrence, with a stable left lower lobe lung nodule.  Bilateral screening mammogram in January 2021 did not reveal any evidence of malignancy.  Her last bone density scan was in February 2021 at Dr. Greggory Keen office.  CT abdomen and pelvis from May 2021 did not reveal any findings to suggest residual or recurrent disease.  The 4 mm left lower lobe lung nodule remained stable. CEA and CA 125 remained normal in January and September 2021.  At her visit in February, she was doing well and the CEA and CA 125 remained normal.  INTERVAL HISTORY:  Erica Moyer is here today for repeat clinical assessment and states she is doing well. She denies any symptoms concerning for recurrent disease. She denies fevers or chills. She reports persistent neuropathy in her feet, which is stable.  She continues gabapentin. She denies pain. Her appetite is good. Her weight has been stable.  Prior to her visit today, she underwent repeat CT abdomen and pelvis. When her Port-A-Cath was accessed for the procedure, did not give blood return and appeared angled, yet still flushed well.  REVIEW OF SYSTEMS:  Review of Systems  Constitutional: Negative for appetite change, chills, fatigue, fever and unexpected weight change.  HENT:   Negative for lump/mass, mouth sores and sore throat.    Respiratory: Negative for cough and shortness of breath.   Cardiovascular: Negative for chest pain and leg swelling.  Gastrointestinal: Negative for abdominal pain, constipation, diarrhea, nausea and vomiting.  Endocrine: Negative for hot flashes.  Genitourinary: Negative for difficulty urinating, dysuria, frequency, hematuria, vaginal bleeding and vaginal discharge.   Musculoskeletal: Negative for arthralgias, back pain and myalgias.  Skin: Negative for rash.  Neurological: Negative for dizziness and headaches.  Hematological: Negative for adenopathy. Does not bruise/bleed easily.  Psychiatric/Behavioral: Negative for depression and sleep disturbance. The patient is not nervous/anxious.    VITALS:  There were no vitals taken for this visit.  Wt Readings from Last 3 Encounters:  06/08/20 206 lb 12.8 oz (93.8 kg)  05/21/20 207 lb 6 oz (94.1 kg)  03/12/20 207 lb 1.6 oz (93.9 kg)    There is no height or weight on file to calculate BMI.  Performance status (ECOG): 0 - Asymptomatic  PHYSICAL EXAM:  Physical Exam Vitals and nursing note reviewed.  Constitutional:      General: She is not in acute distress.    Appearance: Normal appearance.  HENT:     Head: Normocephalic and atraumatic.     Mouth/Throat:     Mouth: Mucous membranes are moist.     Pharynx: Oropharynx is clear. No oropharyngeal exudate or posterior oropharyngeal erythema.  Eyes:     General: No scleral icterus.    Extraocular Movements: Extraocular movements intact.     Conjunctiva/sclera: Conjunctivae normal.     Pupils: Pupils are equal, round, and reactive to light.  Cardiovascular:     Rate and Rhythm: Normal rate and regular rhythm.     Heart sounds: Normal heart sounds. No murmur heard. No friction rub. No gallop.   Pulmonary:     Effort: Pulmonary effort is normal.     Breath sounds: Normal breath sounds. No wheezing, rhonchi or rales.  Chest:  Breasts:     Right: No axillary adenopathy or  supraclavicular adenopathy.     Left: No axillary adenopathy or supraclavicular adenopathy.    Abdominal:     General: There is no distension.     Palpations: Abdomen is soft. There is no hepatomegaly, splenomegaly or mass.     Tenderness: There is no abdominal tenderness.  Musculoskeletal:        General: Normal range of motion.     Cervical back: Normal range of motion and neck supple. No tenderness.     Right lower leg: No edema.     Left lower leg: No edema.  Lymphadenopathy:     Cervical: No cervical adenopathy.     Upper Body:     Right upper body: No supraclavicular or axillary adenopathy.     Left upper body: No supraclavicular or axillary adenopathy.     Lower Body: No right inguinal adenopathy. No left inguinal adenopathy.  Skin:    General: Skin is warm and dry.     Coloration: Skin is not jaundiced.     Findings: No rash.  Neurological:     Mental Status: She is alert and oriented to person, place, and time.     Cranial Nerves: No cranial nerve deficit.  Psychiatric:        Mood and Affect: Mood normal.        Behavior: Behavior normal.  Thought Content: Thought content normal.    LABS:   CBC Latest Ref Rng & Units 07/07/2020 03/12/2020 09/16/2015  WBC - 4.3 4.7 8.0  Hemoglobin 12.0 - 16.0 13.9 13.7 13.6  Hematocrit 36 - 46 42 41 40.2  Platelets 150 - 399 147(A) 152 211   CMP Latest Ref Rng & Units 07/07/2020 03/12/2020 08/25/2015  Glucose 70 - 140 mg/dl - - 90  BUN 4 - 21 14 14  15.1  Creatinine 0.5 - 1.1 0.9 0.8 0.8  Sodium 137 - 147 141 139 143  Potassium 3.4 - 5.3 3.9 4.2 3.9  Chloride 99 - 108 106 105 -  CO2 13 - 22 25(A) 27(A) 24  Calcium 8.7 - 10.7 9.2 9.7 9.6  Total Protein 6.5 - 8.1 g/dL - - -  Total Bilirubin 0.3 - 1.2 mg/dL - - -  Alkaline Phos 25 - 125 69 71 -  AST 13 - 35 35 29 -  ALT 7 - 35 33 28 -   CEA was normal at 0.4 and CA 125 was normal at 8.4 on 07/07/2020 at [facility]  Lab Results  Component Value Date   CEA1 0.3  03/12/2020   /  CEA  Date Value Ref Range Status  03/12/2020 0.3 0.0 - 4.7 ng/mL Final    Comment:    (NOTE)                             Nonsmokers          <3.9                             Smokers             <5.6 [employer] Electrochemiluminescence Immunoassay (ECLIA) Values obtained with different assay methods or kits cannot be used interchangeably.  Results cannot be interpreted as absolute evidence of the presence or absence of malignant disease. Performed At: [facility] [place], [place] [id number] Rush Farmer MD LZ:[id number]    No results found for: PSA1 No results found for: CAN199 No results found for: CAN125  No results found for: TOTALPROTELP, ALBUMINELP, A1GS, A2GS, BETS, BETA2SER, GAMS, MSPIKE, SPEI No results found for: TIBC, FERRITIN, IRONPCTSAT No results found for: LDH  STUDIES:  No results found.  Exam(s): [id number] CT/CT ABD-PELV W/IV CM CLINICAL DATA:  History of uterine cancer, restaging  EXAM: CT ABDOMEN AND PELVIS WITH CONTRAST  TECHNIQUE: Multidetector CT imaging of the abdomen and pelvis was performed using the standard protocol following bolus administration of intravenous contrast.  CONTRAST:  100 cc of Isovue 370  COMPARISON:  Multiple priors including most recent CT abdomen and pelvis Jul 09, 2019  FINDINGS: Lower chest: No acute abnormality. Unchanged size of the 4 mm left lower lobe pulmonary nodule on image 10/4.  Hepatobiliary: Diffuse hepatic steatosis. No suspicious hepatic lesion. Gallbladder surgically absent. Unchanged mild fusiform dilation of the common bile duct likely reservoir effect post cholecystectomy.  Pancreas: Unremarkable. No pancreatic ductal dilatation or surrounding inflammatory changes.  Spleen: Normal in size without focal abnormality.  Adrenals/Urinary Tract: Adrenal glands are unremarkable. Kidneys are normal, without renal calculi, solid enhancing lesion,  or hydronephrosis. Bladder is unremarkable.  Stomach/Bowel: Small hiatal hernia otherwise the stomach is grossly unremarkable. Radiopaque enteric contrast traverses the descending colon. Normal positioning of the duodenum/ligament of Treitz. No pathologic dilation of small bowel. The appendix and terminal  ileum within normal limits. No suspicious colonic wall thickening or mass like lesions visualized.  Vascular/Lymphatic: No significant vascular findings are present. No pathologically enlarged abdominal or pelvic lymph nodes.  Reproductive: Status post hysterectomy without suspicious soft tissue nodularity along the vaginal cuff. No adnexal masses.  Other: No abdominopelvic ascites. No overt peritoneal or omental nodularity.  Musculoskeletal: Mild multilevel degenerative changes spine. No aggressive lytic or blastic lesions of bone. No acute osseous abnormality.  IMPRESSION: 1. Stable examination status post hysterectomy without evidence of recurrence or metastatic disease within the abdomen or pelvis. 2. Stable 4 mm left lower lobe pulmonary nodule.   HISTORY:   Past Medical History:  Diagnosis Date  . Cancer [facility])    endometrial cancer  . Complication of anesthesia    slow to wake up  . Frequency of urination   . GERD (gastroesophageal reflux disease)   . Headache    hx migraines  . Hyperlipidemia   . Neuropathy   . Osteopenia   . PONV (postoperative nausea and vomiting)   . Radiation 05/19/15-06/11/15   vaginal cuff 30 Gy  . Urgency incontinence     Past Surgical History:  Procedure Laterality Date  . ABDOMINAL HYSTERECTOMY    . CHOLECYSTECTOMY    . ROBOTIC ASSISTED TOTAL HYSTERECTOMY WITH BILATERAL SALPINGO OOPHERECTOMY Bilateral 02/25/2015   Procedure: XI ROBOTIC ASSISTED TOTAL HYSTERECTOMY WITH BILATERAL SALPINGO OOPHORECTOMY WITH SENTINAL LYMPH NODE MAPPING;  Surgeon: [name], MD;  Location: [facility];  Service: Gynecology;  Laterality: Bilateral;  .  TUBAL LIGATION     1986    Family History  Problem Relation Age of Onset  . Anesthesia problems Mother   . Hypertension Mother   . Hypertension Father   . Stroke Father   . Cancer Paternal Grandmother        not sure what type    Social History:  reports that she has never smoked. She has never used smokeless tobacco. She reports that she does not drink alcohol and does not use drugs.The patient is alone today.  Allergies: No Known Allergies  Current Medications: Current Outpatient Medications  Medication Sig Dispense Refill  . calcium carbonate (TUMS - DOSED IN MG ELEMENTAL CALCIUM) 500 MG chewable tablet Chew 1 tablet by mouth 3 (three) times daily as needed for indigestion or heartburn. Reported on 05/19/2015    . cetirizine (ZYRTEC) 10 MG tablet Take 10 mg by mouth daily.    Marland Kitchen gabapentin (NEURONTIN) 300 MG capsule Take 300 mg by mouth 3 (three) times daily.    Marland Kitchen ibuprofen (ADVIL,MOTRIN) 200 MG tablet Take 200 mg by mouth every 6 (six) hours as needed (pain). Reported on 04/15/2015 (Patient not taking: Reported on 06/07/2020)    . mometasone (NASONEX) 50 MCG/ACT nasal spray Place 2 sprays into the nose daily as needed. (Patient not taking: Reported on 06/07/2020)    . OVER THE COUNTER MEDICATION Vitamin B6 100 mg tab daily    . pantoprazole (PROTONIX) 40 MG tablet Take 1 tablet (40 mg total) by mouth daily. Take 1/2 hour before supper 90 tablet 4  . Prenatal MV-Min-FA-Omega-3 (PRENATAL GUMMIES/DHA & FA PO) Take 2 tablets by mouth daily. Take 2 gummies. One a Day multivitamin    . rosuvastatin (CRESTOR) 10 MG tablet Take 10 mg by mouth at bedtime.   3   No current facility-administered medications for this visit.     ASSESSMENT & PLAN:   Assessment:   1. History of recurrent endometrial cancer in the peritoneum  treated with chemotherapy with a complete response.  She remains without evidence of disease.    2. Osteopenia, for which she is on calcium and vitamin-D.  She is up to  date on bone density scan in February 2021 and has these done at Dr. Greggory Keen office.    3. Peripheral neuropathy secondary to chemotherapy, which is stable.  She continues gabapentin 300 mg 3 times daily.  4. Lung nodule of the left lower lobe, which measures 4 mm and remains stable on recent CT imaging.  5.  Abnormal positioning of Port-A-Cath without blood return, so we will have it removed.  Plan:    She continues to do well and remain without evidence of recurrence.  We will arrange for her Port-A-Cath to be removed in Interventional Radiology.  We will plan to see her back in 6 months with a CEA and CA 125. The patient understands the plans discussed today and is in agreement with them.  She knows to contact our office if she develops concerns prior to her next appointment.     Marvia Pickles, PA-C

## 2020-07-09 NOTE — Telephone Encounter (Signed)
Per 5/27 los next appt sched and given to patient

## 2020-07-15 DIAGNOSIS — T82598A Other mechanical complication of other cardiac and vascular devices and implants, initial encounter: Secondary | ICD-10-CM | POA: Diagnosis not present

## 2020-07-15 DIAGNOSIS — C541 Malignant neoplasm of endometrium: Secondary | ICD-10-CM | POA: Diagnosis not present

## 2020-07-15 DIAGNOSIS — Z452 Encounter for adjustment and management of vascular access device: Secondary | ICD-10-CM | POA: Diagnosis not present

## 2020-07-16 ENCOUNTER — Telehealth: Payer: Self-pay

## 2020-07-16 NOTE — Telephone Encounter (Signed)
-----   Message from Marvia Pickles, PA-C sent at 07/16/2020 11:37 AM EDT ----- Please let her know cancer markers normal. Thanks!

## 2020-07-16 NOTE — Telephone Encounter (Signed)
Patient notified

## 2020-07-19 ENCOUNTER — Encounter: Payer: Self-pay | Admitting: Oncology

## 2020-09-06 DIAGNOSIS — L814 Other melanin hyperpigmentation: Secondary | ICD-10-CM | POA: Diagnosis not present

## 2020-09-06 DIAGNOSIS — D1801 Hemangioma of skin and subcutaneous tissue: Secondary | ICD-10-CM | POA: Diagnosis not present

## 2020-09-06 DIAGNOSIS — D2239 Melanocytic nevi of other parts of face: Secondary | ICD-10-CM | POA: Diagnosis not present

## 2020-09-06 DIAGNOSIS — D225 Melanocytic nevi of trunk: Secondary | ICD-10-CM | POA: Diagnosis not present

## 2020-09-24 DIAGNOSIS — Z0000 Encounter for general adult medical examination without abnormal findings: Secondary | ICD-10-CM | POA: Diagnosis not present

## 2020-09-24 DIAGNOSIS — Z9181 History of falling: Secondary | ICD-10-CM | POA: Diagnosis not present

## 2020-09-24 DIAGNOSIS — Z1331 Encounter for screening for depression: Secondary | ICD-10-CM | POA: Diagnosis not present

## 2020-09-24 DIAGNOSIS — K649 Unspecified hemorrhoids: Secondary | ICD-10-CM | POA: Diagnosis not present

## 2020-09-24 DIAGNOSIS — M8580 Other specified disorders of bone density and structure, unspecified site: Secondary | ICD-10-CM | POA: Diagnosis not present

## 2020-09-24 DIAGNOSIS — E7800 Pure hypercholesterolemia, unspecified: Secondary | ICD-10-CM | POA: Diagnosis not present

## 2020-09-24 DIAGNOSIS — Z79899 Other long term (current) drug therapy: Secondary | ICD-10-CM | POA: Diagnosis not present

## 2020-09-24 DIAGNOSIS — Z6832 Body mass index (BMI) 32.0-32.9, adult: Secondary | ICD-10-CM | POA: Diagnosis not present

## 2020-11-15 ENCOUNTER — Other Ambulatory Visit: Payer: Self-pay

## 2020-11-15 ENCOUNTER — Encounter: Payer: Self-pay | Admitting: Gynecologic Oncology

## 2020-11-15 ENCOUNTER — Inpatient Hospital Stay: Payer: PPO | Attending: Gynecologic Oncology | Admitting: Gynecologic Oncology

## 2020-11-15 VITALS — BP 157/74 | HR 69 | Temp 98.5°F | Resp 16 | Wt 208.0 lb

## 2020-11-15 DIAGNOSIS — Z8542 Personal history of malignant neoplasm of other parts of uterus: Secondary | ICD-10-CM | POA: Insufficient documentation

## 2020-11-15 DIAGNOSIS — Z90710 Acquired absence of both cervix and uterus: Secondary | ICD-10-CM | POA: Insufficient documentation

## 2020-11-15 DIAGNOSIS — Z9221 Personal history of antineoplastic chemotherapy: Secondary | ICD-10-CM | POA: Diagnosis not present

## 2020-11-15 DIAGNOSIS — C541 Malignant neoplasm of endometrium: Secondary | ICD-10-CM

## 2020-11-15 DIAGNOSIS — Z90722 Acquired absence of ovaries, bilateral: Secondary | ICD-10-CM | POA: Diagnosis not present

## 2020-11-15 DIAGNOSIS — Z923 Personal history of irradiation: Secondary | ICD-10-CM | POA: Diagnosis not present

## 2020-11-15 NOTE — Progress Notes (Signed)
Gynecologic Oncology Return Clinic Visit  11/15/2020  Reason for Visit: Surveillance visit in the setting of recurrent endometrial cancer  Treatment History: Oncology History Overview Note  [place] reports having an episode of vaginal spotting in May 2016. She initially thought this was due to urethral irritation as she has bladder prolapse. She then continued to spot through the summer and fall of 2016 and was evaluated by Dr. Marvel Plan on 01/19/2015 at which time a transvaginal ultrasound scan was performed which revealed a normal size uterus measuring 7.4 x 8.4 x 3.7 cm with a thickened endometrial stripe 15 mm. The ovaries were grossly normal. She then underwent a endometrial biopsy on 01/28/2015 which revealed FIGO grade 2 moderately differentiated endometrioid adenocarcinoma   Endometrial cancer (Erica Moyer)  01/28/2015 Initial Diagnosis   Endometrial cancer ([place]) FIGO grade 2 moderately differentiated endometrioid adenocarcinoma   02/25/2015 Surgery   robotic laparoscopic hysterectomy bilateral salpingo-oophorectomy and sentinel lymph nodes on    02/25/2015 Pathology Results    grade 3 endometrial cancer measuring 2.4 cm in diameter with superficial myometrial invasion. There was focal lymphatic vascular involvement. Lymph nodes and adnexa were negative for any metastatic disease.  Stage IA     - 06/11/2015 Radiation Therapy   Vaginal cuff brachytherapy   08/2015 Relapse/Recurrence    She was seen by Dr. Sofie Hartigan in mid July and at that time complained of increasing pelvic and lower back pain. CT scan was obtained on July 26 demonstrated what appears to be intraperitoneal disease including a left-sided metastatic site measuring 4.6 x 3.4 x 3 cm causing mass effect on the distal third of the left ureter and moderate proximal hydronephrosis and has some mass effect on the sigmoid colon. There were 2 implants in the right hemipelvis measuring 3 x 2.2 and 2.9 x 2.3 cm.   CT Guided biopsy  c/w recurrent endometrial cancer.    08/2015 - 01/15/2016 Chemotherapy   Received adjuvant taxol/carboplatin x 6   02/10/2016 Imaging   Imaging 02/10/2016 without evidence of metastatic carcinoma   05/08/2016 Imaging   Imaging 05/08/2016 without evidence of recurrent disease.   11/02/2016 Imaging   No evidence of metastatic disease.  Bilateral inguinal hernias containing fat   04/27/2017 Imaging   CT chest abdomen and pelvis No findings worrisome for metastatic disease in the chest abdomen or pelvis.  There is small pulmonary nodules bilaterally which are predominantly subpleural or perifissural all likely lymph nodes some of these were not previously imaged suggest attention on routine follow-up   05/01/2017 Miscellaneous   CA125    8.8   11/01/2017 Imaging   CT of the chest abdomen and pelvis with contrast Small 4 mm right upper lobe pulmonary nodule likely part of the apical pleural-parenchymal scarring Small type I hiatal hernia aortic atherosclerosis no findings of active or recurrent malignancy   04/29/2018 Imaging   CT of the abdomen and pelvis No evidence of recurrent or metastatic disease     Interval History: Patient presents today for surveillance visit.  Was last seen in April and was NED at that time.  Patient notes overall doing well.  She denies any abdominal or pelvic pain.  She reports a good appetite without nausea or emesis.  She continues to have some issues with bowel function and has had some infrequent episodes of fecal incontinence.  This typically happens if she has a couple of days where she does not have as much bowel function.  Denies any vaginal bleeding or discharge.  Sees gastroenterologist and PCP regularly.  Past Medical/Surgical History: Past Medical History:  Diagnosis Date   Cancer ([place])    endometrial cancer   Complication of anesthesia    slow to wake up   Frequency of urination    GERD (gastroesophageal reflux disease)    Headache    hx  migraines   Hyperlipidemia    Neuropathy    Osteopenia    PONV (postoperative nausea and vomiting)    Radiation 05/19/15-06/11/15   vaginal cuff 30 Gy   Urgency incontinence     Past Surgical History:  Procedure Laterality Date   ABDOMINAL HYSTERECTOMY     CHOLECYSTECTOMY     ROBOTIC ASSISTED TOTAL HYSTERECTOMY WITH BILATERAL SALPINGO OOPHERECTOMY Bilateral 02/25/2015   Procedure: XI ROBOTIC ASSISTED TOTAL HYSTERECTOMY WITH BILATERAL SALPINGO OOPHORECTOMY WITH SENTINAL LYMPH NODE MAPPING;  Surgeon: [name], MD;  Location: [facility];  Service: Gynecology;  Laterality: Bilateral;   TUBAL LIGATION     1986    Family History  Problem Relation Age of Onset   Anesthesia problems Mother    Hypertension Mother    Hypertension Father    Stroke Father    Cancer Paternal Grandmother        not sure what type    Social History   Socioeconomic History   Marital status: Married    Spouse name: Not on file   Number of children: 1   Years of education: Not on file   Highest education level: Not on file  Occupational History   Occupation: retired Pharmacist, hospital  Tobacco Use   Smoking status: Never   Smokeless tobacco: Never  Vaping Use   Vaping Use: Never used  Substance and Sexual Activity   Alcohol use: No   Drug use: No   Sexual activity: Not Currently  Other Topics Concern   Not on file  Social History Narrative   Not on file   Social Determinants of Health   Financial Resource Strain: Not on file  Food Insecurity: Not on file  Transportation Needs: Not on file  Physical Activity: Not on file  Stress: Not on file  Social Connections: Not on file    Current Medications:  Current Outpatient Medications:    calcium carbonate (TUMS - DOSED IN MG ELEMENTAL CALCIUM) 500 MG chewable tablet, Chew 1 tablet by mouth 3 (three) times daily as needed for indigestion or heartburn. Reported on 05/19/2015, Disp: , Rfl:    cetirizine (ZYRTEC) 10 MG tablet, Take 10 mg by mouth daily.,  Disp: , Rfl:    gabapentin (NEURONTIN) 300 MG capsule, Take 300 mg by mouth 3 (three) times daily., Disp: , Rfl:    ibuprofen (ADVIL,MOTRIN) 200 MG tablet, Take 200 mg by mouth every 6 (six) hours as needed (pain). Reported on 04/15/2015, Disp: , Rfl:    mometasone (NASONEX) 50 MCG/ACT nasal spray, Place 2 sprays into the nose daily as needed., Disp: , Rfl:    OVER THE COUNTER MEDICATION, Vitamin B6 100 mg tab daily, Disp: , Rfl:    pantoprazole (PROTONIX) 40 MG tablet, Take 1 tablet (40 mg total) by mouth daily. Take 1/2 hour before supper, Disp: 90 tablet, Rfl: 4   Prenatal MV-Min-FA-Omega-3 (PRENATAL GUMMIES/DHA & FA PO), Take 2 tablets by mouth daily. Take 2 gummies. One a Day multivitamin, Disp: , Rfl:    rosuvastatin (CRESTOR) 10 MG tablet, Take 10 mg by mouth at bedtime. , Disp: , Rfl: 3  Review of Systems: Denies appetite changes, fevers, chills, fatigue,  unexplained weight changes. Denies hearing loss, neck lumps or masses, mouth sores, ringing in ears or voice changes. Denies cough or wheezing.  Denies shortness of breath. Denies chest pain or palpitations. Denies leg swelling. Denies abdominal distention, pain, blood in stools, constipation, diarrhea, nausea, vomiting, or early satiety. Denies pain with intercourse, dysuria, frequency, hematuria or incontinence. Denies hot flashes, pelvic pain, vaginal bleeding or vaginal discharge.   Denies joint pain, back pain or muscle pain/cramps. Denies itching, rash, or wounds. Denies dizziness, headaches, numbness or seizures. Denies swollen lymph nodes or glands, denies easy bruising or bleeding. Denies anxiety, depression, confusion, or decreased concentration.  Physical Exam: BP (!) 157/74 (BP Location: Left Arm, Patient Position: Sitting)   Pulse 69   Temp 98.5 F (36.9 C) (Oral)   Resp 16   Wt 208 lb (94.3 kg)   SpO2 100%   BMI 32.58 kg/m  General: Alert, oriented, no acute distress. HEENT: Normocephalic, atraumatic, sclera  anicteric. Chest: Unlabored breathing on room air.  No wheezes or rhonchi Cardiovascular: Regular rate and rhythm, no murmurs. Abdomen: Obese, soft, nontender.  Normoactive bowel sounds.  No masses or hepatosplenomegaly appreciated.  Well-healed incisions. Extremities: Grossly normal range of motion.  Warm, well perfused.  No edema bilaterally. Skin: No rashes or lesions noted. Lymphatics: No cervical, supraclavicular, or inguinal adenopathy. GU: Normal appearing external genitalia without erythema, excoriation, or lesions.  Speculum exam reveals mildly atrophic vaginal mucosa, cuff intact.  No lesions or masses.  Bimanual exam reveals cuff intact, no nodularity or masses.  Rectovaginal exam confirms findings.  Laboratory & Radiologic Studies: None new  06/2020: CT abdomen and pelvis revealed stable examination after hysterectomy without evidence of recurrence or metastatic disease within the abdomen or pelvis.  Stable 4 mm left lower lobe pulmonary nodule.  Assessment & Plan: Erica Moyer is a 70 y.o. woman with history of recurrent endometrial cancer who completed treatment in 01/2016 presenting today for surveillance visit.  Patient presents today for surveillance visit.  She remains NED on exam.  She is doing very well.  She will be 5 years out from completion of treatment in the recurrent setting, we discussed transitioning to yearly visits.  This can either be with a GYN in our clinic or with her PCP (if comfortable) versus a GYN.  The patient will reach out to [place] next summer if she does not have someone in mind for her visit next fall to return to see [place].  Patient is scheduled to see Dr. Hinton Rao in late November.  Discussed increasing fiber to see if this helps with bowel function.  32 minutes of total time was spent for this patient encounter, including preparation, face-to-face counseling with the patient and coordination of care, and documentation of the encounter.  Jeral Pinch, MD  Division of Gynecologic Oncology  Department of Obstetrics and Gynecology  [facility]

## 2020-11-15 NOTE — Patient Instructions (Addendum)
It was nice to meet you today.    Congratulations on being just shy of 5 years from your completion of treatment for your recurrence.  At this time, we can Transition to visits every year.  This can be either in our office with Dr. Delsa Sale or with your PCP/gynecologist.  At your yearly visit, you will need to have a pelvic exam.  If, by next summer, you do not have someone in mind for this visit, please call our clinic we will get you in with Dr. Delsa Sale.

## 2020-12-09 DIAGNOSIS — Z23 Encounter for immunization: Secondary | ICD-10-CM | POA: Diagnosis not present

## 2020-12-30 NOTE — Progress Notes (Signed)
[facility]  [address] [place]  [id number] [phone]  Clinic Day:  01/10/2021  Referring physician: [name], MD  This document serves as a record of services personally performed by Hosie Poisson, MD. It was created on their behalf by Curry,Lauren E, a trained medical scribe. The creation of this record is based on the scribe's personal observations and the provider's statements to them.  CHIEF COMPLAINT:  CC:    Recurrent endometrial cancer  Current Treatment:   Observation    HISTORY OF PRESENT ILLNESS:  AMBREEN Moyer is a 70 y.o. female with stage IA (T1 N0 M0) endometrioid adenocarcinoma treated with robotic laparoscopic hysterectomy and bilateral salpingo-oophorectomy in January 2017.  Pathology revealed a 2.4 cm, grade 3, endometrioid adenocarcinoma with squamous differentiation.  There was superficial myometrial invasion.  Focal lymphovascular invasion was seen.  Seven nodes were negative for metastasis.  She received adjuvant radiotherapy completed in April 2017.   She developed abdominal pain and change in her stool caliber in June.  She saw Dr. Sondra Come in July 2017 and pelvic examination was normal.  CT abdomen and pelvis revealed recurrent endometrial carcinoma in the peritoneum with associated left hydronephrosis.  Biopsy in August revealed poorly differentiated carcinoma consistent with her endometrial primary.  She saw Dr. Fermin Schwab, who recommended palliative chemotherapy with carboplatin/paclitaxel.  We began seeing her in August 2017 to initiate chemotherapy.  She received 6 cycles of carboplatin/paclitaxel completed in December 2017 with an excellent response.  She had difficulty tolerating chemotherapy due to anorexia, nausea, dehydration, neuropathy and hypomagnesemia.  She required IV and oral magnesium replacement.  She developed peripheral neuropathy of the hands and feet and was given gabapentin 300 mg  three times daily and pyridoxine 100 mg daily.  Repeat imaging and tumor markers have remained normal since completing chemotherapy.  Colonoscopy in August 2015 was normal, so repeat 10 years was recommended.    Bone density scan in February 2019 revealed worsening osteopenia with a T-score of -1.7 in the spine and a T-score of -1.8 in the femur, for which she is on calcium and vitamin-D.  In May 2019, Dr. Helene Kelp ordered left diagnostic mammogram and ultrasound for a possible breast mass, which did not reveal any evidence of malignancy.  CT abdomen and pelvis in September 2019 revealed a stable left lower lobe lung nodule without evidence of recurrent or metastatic disease. Bilateral screening mammogram in January 2020 did not reveal any evidence of malignancy.  CT abdomen and pelvis in March and September 2020 remained without evidence of recurrence, with a stable left lower lobe lung nodule.  Her last bone density scan was in February 2021 at Dr. Greggory Keen office.  CT abdomen and pelvis from May 2021 did not reveal any findings to suggest residual or recurrent disease.  The 4 mm left lower lobe lung nodule remained stable. CEA and CA 125 remained normal in January and September 2021. At her visit in February 2022, she was doing well and the CEA and CA 125 remained normal. Bilateral screening mammogram in February 2022 did not reveal any evidence of malignancy.  CT abdomen and pelvis from May 2022 revealed no findings of active or recurrent malignancy, and stable 4 mm right lower lobe pulmonary nodule.  INTERVAL HISTORY:  Erica Moyer is here for routine follow up and states that she has been well and denies complaints. She asks whether her cancer could recur anywhere and I acknowledged that this is possible,  but she has been off chemotherapy for 5 years now. Blood counts and chemistries are unremarkable. Her  appetite is good, and her weight is stable since her last visit.  She denies fever, chills or other signs of  infection.  She denies nausea, vomiting, bowel issues, or abdominal pain.  She denies sore throat, cough, dyspnea, or chest pain.  REVIEW OF SYSTEMS:  Review of Systems  Constitutional: Negative.  Negative for appetite change, chills, fatigue, fever and unexpected weight change.  HENT:  Negative.    Eyes: Negative.   Respiratory: Negative.  Negative for chest tightness, cough, hemoptysis, shortness of breath and wheezing.   Cardiovascular: Negative.  Negative for chest pain, leg swelling and palpitations.  Gastrointestinal: Negative.  Negative for abdominal distention, abdominal pain, blood in stool, constipation, diarrhea, nausea and vomiting.  Endocrine: Negative.   Genitourinary: Negative.  Negative for difficulty urinating, dysuria, frequency and hematuria.   Musculoskeletal: Negative.  Negative for arthralgias, back pain, flank pain, gait problem and myalgias.  Skin: Negative.   Neurological: Negative.  Negative for dizziness, extremity weakness, gait problem, headaches, light-headedness, numbness, seizures and speech difficulty.  Hematological: Negative.   Psychiatric/Behavioral: Negative.  Negative for depression and sleep disturbance. The patient is not nervous/anxious.   VITALS:  Blood pressure (!) 149/72, pulse 75, temperature 98.1 F (36.7 C), temperature source Oral, resp. rate 18, height 5\' 7"  (1.702 m), weight 207 lb 3.2 oz (94 kg), SpO2 99 %.  Wt Readings from Last 3 Encounters:  01/10/21 207 lb 3.2 oz (94 kg)  11/15/20 208 lb (94.3 kg)  07/09/20 206 lb 12.8 oz (93.8 kg)    Body mass index is 32.45 kg/m.  Performance status (ECOG): 0 - Asymptomatic  PHYSICAL EXAM:  Physical Exam Constitutional:      General: She is not in acute distress.    Appearance: Normal appearance. She is normal weight.  HENT:     Head: Normocephalic and atraumatic.  Eyes:     General: No scleral icterus.    Extraocular Movements: Extraocular movements intact.     Conjunctiva/sclera:  Conjunctivae normal.     Pupils: Pupils are equal, round, and reactive to light.  Cardiovascular:     Rate and Rhythm: Normal rate and regular rhythm.     Pulses: Normal pulses.     Heart sounds: Normal heart sounds. No murmur heard.   No friction rub. No gallop.  Pulmonary:     Effort: Pulmonary effort is normal. No respiratory distress.     Breath sounds: Normal breath sounds.  Abdominal:     General: Bowel sounds are normal. There is no distension.     Palpations: Abdomen is soft. There is no hepatomegaly, splenomegaly or mass.     Tenderness: There is no abdominal tenderness.  Musculoskeletal:        General: Normal range of motion.     Cervical back: Normal range of motion and neck supple.     Right lower leg: No edema.     Left lower leg: No edema.  Lymphadenopathy:     Cervical: No cervical adenopathy.  Skin:    General: Skin is warm and dry.  Neurological:     General: No focal deficit present.     Mental Status: She is alert and oriented to person, place, and time. Mental status is at baseline.  Psychiatric:        Mood and Affect: Mood normal.        Behavior: Behavior normal.  Thought Content: Thought content normal.        Judgment: Judgment normal.   LABS:   CBC Latest Ref Rng & Units 01/10/2021 07/07/2020 03/12/2020  WBC - 5.1 4.3 4.7  Hemoglobin 12.0 - 16.0 13.6 13.9 13.7  Hematocrit 36 - 46 41 42 41  Platelets 150 - 399 162 147(A) 152   CMP Latest Ref Rng & Units 01/10/2021 07/07/2020 03/12/2020  Glucose 70 - 140 mg/dl - - -  BUN 4 - 21 14 14 14   Creatinine 0.5 - 1.1 0.8 0.9 0.8  Sodium 137 - 147 140 141 139  Potassium 3.4 - 5.3 4.0 3.9 4.2  Chloride 99 - 108 109(A) 106 105  CO2 13 - 22 24(A) 25(A) 27(A)  Calcium 8.7 - 10.7 9.2 9.2 9.7  Total Protein 6.5 - 8.1 g/dL - - -  Total Bilirubin 0.3 - 1.2 mg/dL - - -  Alkaline Phos 25 - 125 62 69 71  AST 13 - 35 34 35 29  ALT 7 - 35 34 33 28    Lab Results  Component Value Date   CEA1 0.3 03/12/2020    /  CEA  Date Value Ref Range Status  03/12/2020 0.3 0.0 - 4.7 ng/mL Final    Comment:    (NOTE)                             Nonsmokers          <3.9                             Smokers             <5.6 Roche [employer] Electrochemiluminescence Immunoassay (ECLIA) Values obtained with different assay methods or kits cannot be used interchangeably.  Results cannot be interpreted as absolute evidence of the presence or absence of malignant disease. Performed At: [facility] [place], [place] [id number] Rush Farmer MD YH:[id number]      STUDIES:  No results found.   HISTORY:   Allergies: No Known Allergies  Current Medications: Current Outpatient Medications  Medication Sig Dispense Refill   calcium carbonate (TUMS - DOSED IN MG ELEMENTAL CALCIUM) 500 MG chewable tablet Chew 1 tablet by mouth 3 (three) times daily as needed for indigestion or heartburn. Reported on 05/19/2015     cetirizine (ZYRTEC) 10 MG tablet Take 10 mg by mouth daily.     gabapentin (NEURONTIN) 300 MG capsule Take 300 mg by mouth 3 (three) times daily.     ibuprofen (ADVIL,MOTRIN) 200 MG tablet Take 200 mg by mouth every 6 (six) hours as needed (pain). Reported on 04/15/2015     mometasone (NASONEX) 50 MCG/ACT nasal spray Place 2 sprays into the nose daily as needed.     OVER THE COUNTER MEDICATION Vitamin B6 100 mg tab daily     pantoprazole (PROTONIX) 40 MG tablet Take 1 tablet (40 mg total) by mouth daily. Take 1/2 hour before supper 90 tablet 4   Prenatal MV-Min-FA-Omega-3 (PRENATAL GUMMIES/DHA & FA PO) Take 2 tablets by mouth daily. Take 2 gummies. One a Day multivitamin     rosuvastatin (CRESTOR) 10 MG tablet Take 10 mg by mouth at bedtime.   3   No current facility-administered medications for this visit.     ASSESSMENT & PLAN:   Assessment:  1. History of recurrent endometrial cancer in the peritoneum  treated with chemotherapy with a complete response.  She remains  without evidence of disease, now off chemotherapy for 5 years.     2. Osteopenia, for which she is on calcium and vitamin-D.  She is up to date on bone density scan in February 2021 and has these done at Dr. Greggory Keen office.  She will be due for repeat exam in February 2023.   3. Peripheral neuropathy secondary to chemotherapy, which is stable.  She continues gabapentin 300 mg 3 times daily.   4. Lung nodule of the left lower lobe, which measures 4 mm and has remained stable on CT imaging.  Plan:     She continues to do well and remain without evidence of recurrence.  We will plan to see her back in 6 months with, CBC, CMP, magnesium, CEA, CA 125 and CT chest, abdomen and pelvis. We will request that Dr. Kennith Maes pursue routine annual pelvic exams. If that is a problem, then I will be glad to do so. We will schedule her annual bilateral mammogram for February. The patient understands the plans discussed today and is in agreement with them.  She knows to contact our office if she develops concerns prior to her next appointment.   I provided 15 minutes of face-to-face time during this this encounter and > 50% was spent counseling as documented under my assessment and plan.   I, Rita Ohara, am acting as scribe for Derwood Kaplan, MD  I have reviewed this report as typed by the medical scribe, and it is complete and accurate.

## 2021-01-10 ENCOUNTER — Other Ambulatory Visit: Payer: Self-pay | Admitting: Oncology

## 2021-01-10 ENCOUNTER — Telehealth: Payer: Self-pay | Admitting: Oncology

## 2021-01-10 ENCOUNTER — Other Ambulatory Visit: Payer: Self-pay | Admitting: Hematology and Oncology

## 2021-01-10 ENCOUNTER — Inpatient Hospital Stay: Payer: PPO

## 2021-01-10 ENCOUNTER — Inpatient Hospital Stay: Payer: PPO | Attending: Gynecologic Oncology | Admitting: Oncology

## 2021-01-10 ENCOUNTER — Encounter: Payer: Self-pay | Admitting: Oncology

## 2021-01-10 ENCOUNTER — Other Ambulatory Visit: Payer: Self-pay

## 2021-01-10 VITALS — BP 149/72 | HR 75 | Temp 98.1°F | Resp 18 | Ht 67.0 in | Wt 207.2 lb

## 2021-01-10 DIAGNOSIS — C541 Malignant neoplasm of endometrium: Secondary | ICD-10-CM

## 2021-01-10 DIAGNOSIS — C786 Secondary malignant neoplasm of retroperitoneum and peritoneum: Secondary | ICD-10-CM

## 2021-01-10 DIAGNOSIS — Z1239 Encounter for other screening for malignant neoplasm of breast: Secondary | ICD-10-CM

## 2021-01-10 LAB — CBC AND DIFFERENTIAL
HCT: 41 (ref 36–46)
Hemoglobin: 13.6 (ref 12.0–16.0)
Neutrophils Absolute: 3.06
Platelets: 162 (ref 150–399)
WBC: 5.1

## 2021-01-10 LAB — HEPATIC FUNCTION PANEL
ALT: 34 (ref 7–35)
AST: 34 (ref 13–35)
Alkaline Phosphatase: 62 (ref 25–125)
Bilirubin, Total: 0.9

## 2021-01-10 LAB — BASIC METABOLIC PANEL
BUN: 14 (ref 4–21)
CO2: 24 — AB (ref 13–22)
Chloride: 109 — AB (ref 99–108)
Creatinine: 0.8 (ref 0.5–1.1)
Glucose: 109
Potassium: 4 (ref 3.4–5.3)
Sodium: 140 (ref 137–147)

## 2021-01-10 LAB — COMPREHENSIVE METABOLIC PANEL
Albumin: 4.3 (ref 3.5–5.0)
Calcium: 9.2 (ref 8.7–10.7)

## 2021-01-10 LAB — CBC: RBC: 4.73 (ref 3.87–5.11)

## 2021-01-10 NOTE — Telephone Encounter (Signed)
Per 11/28 lops next appt scheduled and given to patient

## 2021-01-27 ENCOUNTER — Other Ambulatory Visit: Payer: Self-pay | Admitting: Hematology and Oncology

## 2021-01-27 DIAGNOSIS — G620 Drug-induced polyneuropathy: Secondary | ICD-10-CM

## 2021-02-02 DIAGNOSIS — L209 Atopic dermatitis, unspecified: Secondary | ICD-10-CM | POA: Diagnosis not present

## 2021-04-28 ENCOUNTER — Other Ambulatory Visit: Payer: Self-pay

## 2021-04-28 MED ORDER — PANTOPRAZOLE SODIUM 40 MG PO TBEC: 40.0000 mg | DELAYED_RELEASE_TABLET | Freq: Every day | ORAL | 4 refills | Status: DC

## 2021-04-28 NOTE — Progress Notes (Signed)
Appointment cancelled for 3-31. Patient was suppose to be on the same day with husband but wasn't and she is having surgery in April so medication sent and they will call with any questions ?

## 2021-05-02 DIAGNOSIS — Z1231 Encounter for screening mammogram for malignant neoplasm of breast: Secondary | ICD-10-CM | POA: Diagnosis not present

## 2021-05-13 ENCOUNTER — Ambulatory Visit: Payer: PPO | Admitting: Gastroenterology

## 2021-06-14 DIAGNOSIS — L209 Atopic dermatitis, unspecified: Secondary | ICD-10-CM | POA: Diagnosis not present

## 2021-07-13 ENCOUNTER — Telehealth: Payer: Self-pay | Admitting: Oncology

## 2021-07-13 ENCOUNTER — Encounter: Payer: Self-pay | Admitting: Oncology

## 2021-07-13 ENCOUNTER — Telehealth: Payer: Self-pay

## 2021-07-13 ENCOUNTER — Inpatient Hospital Stay: Payer: PPO

## 2021-07-13 ENCOUNTER — Inpatient Hospital Stay: Payer: PPO | Attending: Oncology | Admitting: Oncology

## 2021-07-13 ENCOUNTER — Other Ambulatory Visit: Payer: Self-pay | Admitting: Oncology

## 2021-07-13 VITALS — BP 133/66 | HR 75 | Temp 97.8°F | Resp 16 | Ht 67.0 in | Wt 210.0 lb

## 2021-07-13 DIAGNOSIS — R918 Other nonspecific abnormal finding of lung field: Secondary | ICD-10-CM | POA: Insufficient documentation

## 2021-07-13 DIAGNOSIS — C786 Secondary malignant neoplasm of retroperitoneum and peritoneum: Secondary | ICD-10-CM

## 2021-07-13 DIAGNOSIS — C541 Malignant neoplasm of endometrium: Secondary | ICD-10-CM

## 2021-07-13 DIAGNOSIS — G629 Polyneuropathy, unspecified: Secondary | ICD-10-CM | POA: Insufficient documentation

## 2021-07-13 DIAGNOSIS — Z08 Encounter for follow-up examination after completed treatment for malignant neoplasm: Secondary | ICD-10-CM | POA: Diagnosis not present

## 2021-07-13 DIAGNOSIS — M8580 Other specified disorders of bone density and structure, unspecified site: Secondary | ICD-10-CM | POA: Insufficient documentation

## 2021-07-13 DIAGNOSIS — Z9221 Personal history of antineoplastic chemotherapy: Secondary | ICD-10-CM | POA: Insufficient documentation

## 2021-07-13 DIAGNOSIS — D649 Anemia, unspecified: Secondary | ICD-10-CM | POA: Diagnosis not present

## 2021-07-13 DIAGNOSIS — Z8542 Personal history of malignant neoplasm of other parts of uterus: Secondary | ICD-10-CM | POA: Insufficient documentation

## 2021-07-13 LAB — BASIC METABOLIC PANEL
BUN: 14 (ref 4–21)
CO2: 27 — AB (ref 13–22)
Chloride: 105 (ref 99–108)
Creatinine: 0.8 (ref 0.5–1.1)
Glucose: 101
Potassium: 4.1 mEq/L (ref 3.5–5.1)
Sodium: 141 (ref 137–147)

## 2021-07-13 LAB — COMPREHENSIVE METABOLIC PANEL
Albumin: 4.6 (ref 3.5–5.0)
Calcium: 9.6 (ref 8.7–10.7)

## 2021-07-13 LAB — HEPATIC FUNCTION PANEL
ALT: 37 U/L — AB (ref 7–35)
AST: 37 — AB (ref 13–35)
Alkaline Phosphatase: 60 (ref 25–125)
Bilirubin, Total: 0.7

## 2021-07-13 LAB — CBC: RBC: 4.9 (ref 3.87–5.11)

## 2021-07-13 LAB — CBC AND DIFFERENTIAL
HCT: 42 (ref 36–46)
Hemoglobin: 13.7 (ref 12.0–16.0)
Neutrophils Absolute: 2.81
Platelets: 151 10*3/uL (ref 150–400)
WBC: 4.6

## 2021-07-13 NOTE — Telephone Encounter (Signed)
-----   Message from Derwood Kaplan, MD sent at 07/13/2021  8:08 AM EDT ----- Regarding: CT Her CT was originally denied and [place] appealed, did she ever get it done? What about labs?Mammo?

## 2021-07-13 NOTE — Progress Notes (Unsigned)
[facility]  [address] [place],  [place]  [address] [phone]  Clinic Day:  07/13/2021  Referring physician: [name], MD  This document serves as a record of services personally performed by Hosie Poisson, MD. It was created on their behalf by [facility] E, a trained medical scribe. The creation of this record is based on the scribe's personal observations and the provider's statements to them.  CHIEF COMPLAINT:  CC:    Recurrent endometrial cancer  Current Treatment:   Observation    HISTORY OF PRESENT ILLNESS:  Erica Moyer is a 71 y.o. female with stage IA (T1 N0 M0) endometrioid adenocarcinoma treated with robotic laparoscopic hysterectomy and bilateral salpingo-oophorectomy in January 2017.  Pathology revealed a 2.4 cm, grade 3, endometrioid adenocarcinoma with squamous differentiation.  There was superficial myometrial invasion.  Focal lymphovascular invasion was seen.  Seven nodes were negative for metastasis.  She received adjuvant radiotherapy completed in April 2017.   She developed abdominal pain and change in her stool caliber in June.  She saw Dr. Sondra Come in July 2017 and pelvic examination was normal.  CT abdomen and pelvis revealed recurrent endometrial carcinoma in the peritoneum with associated left hydronephrosis.  Biopsy in August revealed poorly differentiated carcinoma consistent with her endometrial primary.  She saw Dr. Fermin Schwab, who recommended palliative chemotherapy with carboplatin/paclitaxel.  We began seeing her in August 2017 to initiate chemotherapy.  She received 6 cycles of carboplatin/paclitaxel completed in December 2017 with an excellent response.  She had difficulty tolerating chemotherapy due to anorexia, nausea, dehydration, neuropathy and hypomagnesemia.  She required IV and oral magnesium replacement.  She developed peripheral neuropathy of the hands and feet and was given gabapentin 300 mg  three times daily and pyridoxine 100 mg daily.  Repeat imaging and tumor markers have remained normal since completing chemotherapy.  Colonoscopy in August 2015 was normal, so repeat 10 years was recommended.    Bone density scan in February 2019 revealed worsening osteopenia with a T-score of -1.7 in the spine and a T-score of -1.8 in the femur, for which she is on calcium and vitamin-D.  In May 2019, Dr. Helene Kelp ordered left diagnostic mammogram and ultrasound for a possible breast mass, which did not reveal any evidence of malignancy.  CT abdomen and pelvis in September 2019 revealed a stable left lower lobe lung nodule without evidence of recurrent or metastatic disease. Bilateral screening mammogram in January 2020 did not reveal any evidence of malignancy.  CT abdomen and pelvis in March and September 2020 remained without evidence of recurrence, with a stable left lower lobe lung nodule.  Her last bone density scan was in February 2021 at Dr. Greggory Keen office.  CT abdomen and pelvis from May 2021 did not reveal any findings to suggest residual or recurrent disease.  The 4 mm left lower lobe lung nodule remained stable. CEA and CA 125 remained normal in January and September 2021. At her visit in February 2022, she was doing well and the CEA and CA 125 remained normal. Bilateral screening mammogram in February 2022 did not reveal any evidence of malignancy.  CT abdomen and pelvis from May 2022 revealed no findings of active or recurrent malignancy, and stable 4 mm right lower lobe pulmonary nodule.  INTERVAL HISTORY:  Erica Moyer is here for routine follow up and states that she has been well and denies complaints. She asks whether her cancer could recur anywhere and I acknowledged that this is possible,  but she has been off chemotherapy for 5 years now. Blood counts and chemistries are unremarkable. Her  appetite is good, and her weight is stable since her last visit.  She denies fever, chills or other signs of  infection.  She denies nausea, vomiting, bowel issues, or abdominal pain.  She denies sore throat, cough, dyspnea, or chest pain.  REVIEW OF SYSTEMS:  Review of Systems  Constitutional: Negative.  Negative for appetite change, chills, fatigue, fever and unexpected weight change.  HENT:  Negative.    Eyes: Negative.   Respiratory: Negative.  Negative for chest tightness, cough, hemoptysis, shortness of breath and wheezing.   Cardiovascular: Negative.  Negative for chest pain, leg swelling and palpitations.  Gastrointestinal: Negative.  Negative for abdominal distention, abdominal pain, blood in stool, constipation, diarrhea, nausea and vomiting.  Endocrine: Negative.   Genitourinary: Negative.  Negative for difficulty urinating, dysuria, frequency and hematuria.   Musculoskeletal: Negative.  Negative for arthralgias, back pain, flank pain, gait problem and myalgias.  Skin: Negative.   Neurological: Negative.  Negative for dizziness, extremity weakness, gait problem, headaches, light-headedness, numbness, seizures and speech difficulty.  Hematological: Negative.   Psychiatric/Behavioral: Negative.  Negative for depression and sleep disturbance. The patient is not nervous/anxious.   VITALS:  There were no vitals taken for this visit.  Wt Readings from Last 3 Encounters:  01/10/21 207 lb 3.2 oz (94 kg)  11/15/20 208 lb (94.3 kg)  07/09/20 206 lb 12.8 oz (93.8 kg)    There is no height or weight on file to calculate BMI.  Performance status (ECOG): 0 - Asymptomatic  PHYSICAL EXAM:  Physical Exam Constitutional:      General: She is not in acute distress.    Appearance: Normal appearance. She is normal weight.  HENT:     Head: Normocephalic and atraumatic.  Eyes:     General: No scleral icterus.    Extraocular Movements: Extraocular movements intact.     Conjunctiva/sclera: Conjunctivae normal.     Pupils: Pupils are equal, round, and reactive to light.  Cardiovascular:     Rate  and Rhythm: Normal rate and regular rhythm.     Pulses: Normal pulses.     Heart sounds: Normal heart sounds. No murmur heard.   No friction rub. No gallop.  Pulmonary:     Effort: Pulmonary effort is normal. No respiratory distress.     Breath sounds: Normal breath sounds.  Abdominal:     General: Bowel sounds are normal. There is no distension.     Palpations: Abdomen is soft. There is no hepatomegaly, splenomegaly or mass.     Tenderness: There is no abdominal tenderness.  Musculoskeletal:        General: Normal range of motion.     Cervical back: Normal range of motion and neck supple.     Right lower leg: No edema.     Left lower leg: No edema.  Lymphadenopathy:     Cervical: No cervical adenopathy.  Skin:    General: Skin is warm and dry.  Neurological:     General: No focal deficit present.     Mental Status: She is alert and oriented to person, place, and time. Mental status is at baseline.  Psychiatric:        Mood and Affect: Mood normal.        Behavior: Behavior normal.        Thought Content: Thought content normal.        Judgment: Judgment normal.  LABS:      Latest Ref Rng & Units 01/10/2021   12:00 AM 07/07/2020   12:00 AM 03/12/2020   12:00 AM  CBC  WBC  5.1      4.3   4.7       Hemoglobin 12.0 - 16.0 13.6      13.9   13.7       Hematocrit 36 - 46 41      42   41       Platelets 150 - 399 162      147   152          This result is from an external source.       Latest Ref Rng & Units 01/10/2021   12:00 AM 07/07/2020   12:00 AM 03/12/2020   12:00 AM  CMP  BUN 4 - '21 14      14   14       '$ Creatinine 0.5 - 1.1 0.8      0.9   0.8       Sodium 137 - 147 140      141   139       Potassium 3.4 - 5.3 4.0      3.9   4.2       Chloride 99 - 108 109      106   105       CO2 13 - '22 24      25   27       '$ Calcium 8.7 - 10.7 9.2      9.2   9.7       Alkaline Phos 25 - 125 62      69   71       AST 13 - 35 34      35   29       ALT 7 - 35 34      33   28           This result is from an external source.     Lab Results  Component Value Date   CEA1 0.3 03/12/2020   /  CEA  Date Value Ref Range Status  03/12/2020 0.3 0.0 - 4.7 ng/mL Final    Comment:    (NOTE)                             Nonsmokers          <3.9                             Smokers             <5.6 [employer] Electrochemiluminescence Immunoassay (ECLIA) Values obtained with different assay methods or kits cannot be used interchangeably.  Results cannot be interpreted as absolute evidence of the presence or absence of malignant disease. Performed At: [facility] [place] [place], [place] [id number] Rush Farmer MD IO:[id number]      STUDIES:  No results found.   HISTORY:   Allergies: No Known Allergies  Current Medications: Current Outpatient Medications  Medication Sig Dispense Refill  . calcium carbonate (TUMS - DOSED IN MG ELEMENTAL CALCIUM) 500 MG chewable tablet Chew 1 tablet by mouth 3 (three) times daily as needed for indigestion or heartburn. Reported on 05/19/2015    . cetirizine (ZYRTEC) 10 MG  tablet Take 10 mg by mouth daily.    Marland Kitchen gabapentin (NEURONTIN) 300 MG capsule TAKE 1 CAPSULE BY MOUTH 3 TIMES A DAY FOR NEUROPATHY 270 capsule 1  . ibuprofen (ADVIL,MOTRIN) 200 MG tablet Take 200 mg by mouth every 6 (six) hours as needed (pain). Reported on 04/15/2015    . mometasone (NASONEX) 50 MCG/ACT nasal spray Place 2 sprays into the nose daily as needed.    Marland Kitchen OVER THE COUNTER MEDICATION Vitamin B6 100 mg tab daily    . pantoprazole (PROTONIX) 40 MG tablet Take 1 tablet (40 mg total) by mouth daily. Take 1/2 hour before supper 90 tablet 4  . Prenatal MV-Min-FA-Omega-3 (PRENATAL GUMMIES/DHA & FA PO) Take 2 tablets by mouth daily. Take 2 gummies. One a Day multivitamin    . rosuvastatin (CRESTOR) 10 MG tablet Take 10 mg by mouth at bedtime.   3   No current facility-administered medications for this visit.     ASSESSMENT & PLAN:    Assessment:  1. History of recurrent endometrial cancer in the peritoneum treated with chemotherapy with a complete response.  She remains without evidence of disease, now off chemotherapy for 5 years.     2. Osteopenia, for which she is on calcium and vitamin-D.  She is up to date on bone density scan in February 2021 and has these done at Dr. Greggory Keen office.  She will be due for repeat exam in February 2023.   3. Peripheral neuropathy secondary to chemotherapy, which is stable.  She continues gabapentin 300 mg 3 times daily.   4. Lung nodule of the left lower lobe, which measures 4 mm and has remained stable on CT imaging.  Plan:     She continues to do well and remain without evidence of recurrence.  We will plan to see her back in 6 months with, CBC, CMP, magnesium, CEA, CA 125 and CT chest, abdomen and pelvis. We will request that Dr. Kennith Maes pursue routine annual pelvic exams. If that is a problem, then I will be glad to do so. We will schedule her annual bilateral mammogram for February. The patient understands the plans discussed today and is in agreement with them.  She knows to contact our office if she develops concerns prior to her next appointment.   I provided 15 minutes of face-to-face time during this this encounter and > 50% was spent counseling as documented under my assessment and plan.   I, Rita Ohara, am acting as scribe for Derwood Kaplan, MD  I have reviewed this report as typed by the medical scribe, and it is complete and accurate.

## 2021-07-13 NOTE — Telephone Encounter (Signed)
Per 07/13/21 los next appt scheduled and confirmed with patient

## 2021-07-13 NOTE — Telephone Encounter (Signed)
Mammo was done in March 2023. No other scans done. All were denied

## 2021-07-15 LAB — CEA: CEA: <0.6 ng/mL (ref 0.0–4.7)

## 2021-07-15 LAB — CA 125: Cancer Antigen (CA) 125: 8.7 U/mL (ref 0.0–38.1)

## 2021-07-27 ENCOUNTER — Other Ambulatory Visit: Payer: Self-pay | Admitting: Hematology and Oncology

## 2021-07-27 DIAGNOSIS — G620 Drug-induced polyneuropathy: Secondary | ICD-10-CM

## 2021-07-29 ENCOUNTER — Other Ambulatory Visit: Payer: Self-pay | Admitting: Hematology and Oncology

## 2021-07-29 DIAGNOSIS — G620 Drug-induced polyneuropathy: Secondary | ICD-10-CM

## 2021-07-29 MED ORDER — GABAPENTIN 300 MG PO CAPS: ORAL_CAPSULE | ORAL | 1 refills | Status: DC

## 2021-09-15 DIAGNOSIS — L814 Other melanin hyperpigmentation: Secondary | ICD-10-CM | POA: Diagnosis not present

## 2021-09-15 DIAGNOSIS — L821 Other seborrheic keratosis: Secondary | ICD-10-CM | POA: Diagnosis not present

## 2021-09-15 DIAGNOSIS — D2239 Melanocytic nevi of other parts of face: Secondary | ICD-10-CM | POA: Diagnosis not present

## 2021-09-15 DIAGNOSIS — D225 Melanocytic nevi of trunk: Secondary | ICD-10-CM | POA: Diagnosis not present

## 2021-10-13 DIAGNOSIS — Z79899 Other long term (current) drug therapy: Secondary | ICD-10-CM | POA: Diagnosis not present

## 2021-10-13 DIAGNOSIS — K219 Gastro-esophageal reflux disease without esophagitis: Secondary | ICD-10-CM | POA: Diagnosis not present

## 2021-10-13 DIAGNOSIS — Z6832 Body mass index (BMI) 32.0-32.9, adult: Secondary | ICD-10-CM | POA: Diagnosis not present

## 2021-10-13 DIAGNOSIS — Z0000 Encounter for general adult medical examination without abnormal findings: Secondary | ICD-10-CM | POA: Diagnosis not present

## 2021-10-13 DIAGNOSIS — E7800 Pure hypercholesterolemia, unspecified: Secondary | ICD-10-CM | POA: Diagnosis not present

## 2021-10-13 DIAGNOSIS — Z1331 Encounter for screening for depression: Secondary | ICD-10-CM | POA: Diagnosis not present

## 2021-10-13 DIAGNOSIS — M8580 Other specified disorders of bone density and structure, unspecified site: Secondary | ICD-10-CM | POA: Diagnosis not present

## 2021-12-20 ENCOUNTER — Ambulatory Visit: Payer: PPO | Admitting: Gastroenterology

## 2021-12-21 DIAGNOSIS — Z23 Encounter for immunization: Secondary | ICD-10-CM | POA: Diagnosis not present

## 2022-01-17 ENCOUNTER — Ambulatory Visit (INDEPENDENT_AMBULATORY_CARE_PROVIDER_SITE_OTHER): Payer: PPO | Admitting: Gastroenterology

## 2022-01-17 VITALS — BP 132/90 | HR 75 | Ht 67.0 in | Wt 207.2 lb

## 2022-01-17 DIAGNOSIS — R194 Change in bowel habit: Secondary | ICD-10-CM

## 2022-01-17 DIAGNOSIS — R159 Full incontinence of feces: Secondary | ICD-10-CM

## 2022-01-17 DIAGNOSIS — K219 Gastro-esophageal reflux disease without esophagitis: Secondary | ICD-10-CM

## 2022-01-17 DIAGNOSIS — R1319 Other dysphagia: Secondary | ICD-10-CM | POA: Diagnosis not present

## 2022-01-17 MED ORDER — PANTOPRAZOLE SODIUM 40 MG PO TBEC: 40.0000 mg | DELAYED_RELEASE_TABLET | Freq: Every day | ORAL | 4 refills | Status: DC

## 2022-01-17 NOTE — Patient Instructions (Addendum)
_______________________________________________________  If you are age 73 or older, your body mass index should be between 23-30. Your Body mass index is 32.45 kg/m. If this is out of the aforementioned range listed, please consider follow up with your Primary Care Provider.  If you are age 15 or younger, your body mass index should be between 19-25. Your Body mass index is 32.45 kg/m. If this is out of the aformentioned range listed, please consider follow up with your Primary Care Provider.   ________________________________________________________  The [facility] providers would like to encourage you to use [facility] to communicate with providers for non-urgent requests or questions.  Due to long hold times on the telephone, sending your provider a message by [facility] may be a faster and more efficient way to get a response.  Please allow 48 business hours for a response.  Please remember that this is for non-urgent requests.  _______________________________________________________  We have given you samples of the following medication to take: Hardin Negus [id number] Exp date 06-13-2023  We have sent the following medications to your pharmacy for you to pick up at your convenience: Protonix  You have been scheduled for a colonoscopy. Please follow written instructions given to you at your visit today.  Please pick up your prep supplies at the pharmacy within the next 1-3 days. If you use inhalers (even only as needed), please bring them with you on the day of your procedure.  Please call with any questions or concerns  Kegel Exercises  Kegel exercises can help strengthen your pelvic floor muscles. The pelvic floor is a group of muscles that support your rectum, small intestine, and bladder. In females, pelvic floor muscles also help support the uterus. These muscles help you control the flow of urine and stool (feces). Kegel exercises are painless and simple. They do not require any  equipment. Your provider may suggest Kegel exercises to: Improve bladder and bowel control. Improve sexual response. Improve weak pelvic floor muscles after surgery to remove the uterus (hysterectomy) or after pregnancy, in females. Improve weak pelvic floor muscles after prostate gland removal or surgery, in males. Kegel exercises involve squeezing your pelvic floor muscles. These are the same muscles you squeeze when you try to stop the flow of urine or keep from passing gas. The exercises can be done while sitting, standing, or lying down, but it is best to vary your position. Ask your health care provider which exercises are safe for you. Do exercises exactly as told by your health care provider and adjust them as directed. Do not begin these exercises until told by your health care provider. Exercises How to do Kegel exercises: Squeeze your pelvic floor muscles tight. You should feel a tight lift in your rectal area. If you are a female, you should also feel a tightness in your vaginal area. Keep your stomach, buttocks, and legs relaxed. Hold the muscles tight for up to 10 seconds. Breathe normally. Relax your muscles for up to 10 seconds. Repeat as told by your health care provider. Repeat this exercise daily as told by your health care provider. Continue to do this exercise for at least 4-6 weeks, or for as long as told by your health care provider. You may be referred to a physical therapist who can help you learn more about how to do Kegel exercises. Depending on your condition, your health care provider may recommend: Varying how long you squeeze your muscles. Doing several sets of exercises every day. Doing exercises for  several weeks. Making Kegel exercises a part of your regular exercise routine. This information is not intended to replace advice given to you by your health care provider. Make sure you discuss any questions you have with your health care provider. Document Revised:  06/10/2020 Document Reviewed: 06/10/2020 Elsevier Patient Education  [place] you,  Dr. Jackquline Denmark

## 2022-01-17 NOTE — Progress Notes (Signed)
Chief Complaint: FU  Referring Provider:  [name], MD      ASSESSMENT AND PLAN;    #1. GERD with small HH. Ba swallow 04/2019- small HH, otherwise Nl  #2.  Change in bowel habits with occ incontinence.   #3.  Endometrial adenocarcinoma s/p robotic TAH with BSO 02/2015 followed by RT with recurrence of peritoneum 08/2015 treated with 6 cycles of carboplatin/paclitaxel. Neg CT abdo/pelvis 06/2020 for recurrence.  Plan: - Protonix 40 mg p.o. QD, 1/2 hr before supper #90  4 refills. - Colon feb  2024 with clenpik - Kegel's exercises - Continue Mg supplements for now.    Discussed risks & benefits of colonoscopy. Risks including rare perforation req laparotomy, bleeding after bx/polypectomy req blood transfusion, rarely missing neoplasms, risks of anesthesia/sedation, rare risk of damage to internal organs. Benefits outweigh the risks. Patient agrees to proceed. All the questions were answered. Pt consents to proceed.  HPI:    Erica Moyer is a 71 y.o. female  Erica Moyer's mother For follow-up visit.  Doing very well from GI standpoint except for sensation of incomplete evacuation with occasional constipation.  She has started magnesium supplements with good results.  Still has occasional incontinence without any rectal sensation.  No further dysphagia.  She underwent barium swallow which showed small hiatal hernia.  No strictures.  Had negative CT Abdo/pelvis 06/2020 for any acute abnormalities or recurrence.  Alt diarrhea and constipation, Dx with IBS in 2005 No melena or hematochezia.   Wt Readings from Last 3 Encounters:  01/17/22 207 lb 3.2 oz (94 kg)  07/13/21 210 lb (95.3 kg)  01/10/21 207 lb 3.2 oz (94 kg)     Past GI procedures: -Colonoscopy 09/2013 ([facility])- neg. -[facility]/29/2018 [facility] negative for any recurrence.  Normal liver, status post cholecystectomy, normal pancreas, spleen mild scarring in the right upper kidney.   Otherwise unremarkable.  CT AP 06/2020 with contrast: Negative for any recurrence. Past Medical History:  Diagnosis Date   Cancer [facility])    endometrial cancer   Complication of anesthesia    slow to wake up   Frequency of urination    GERD (gastroesophageal reflux disease)    Headache    hx migraines   Hyperlipidemia    Neuropathy    Osteopenia    PONV (postoperative nausea and vomiting)    Radiation 05/19/15-06/11/15   vaginal cuff 30 Gy   Urgency incontinence     Past Surgical History:  Procedure Laterality Date   ABDOMINAL HYSTERECTOMY     CHOLECYSTECTOMY     ROBOTIC ASSISTED TOTAL HYSTERECTOMY WITH BILATERAL SALPINGO OOPHERECTOMY Bilateral 02/25/2015   Procedure: XI ROBOTIC ASSISTED TOTAL HYSTERECTOMY WITH BILATERAL SALPINGO OOPHORECTOMY WITH SENTINAL LYMPH NODE MAPPING;  Surgeon: [name], MD;  Location: [facility];  Service: Gynecology;  Laterality: Bilateral;   TUBAL LIGATION     1986    Family History  Problem Relation Age of Onset   Anesthesia problems Mother    Hypertension Mother    Hypertension Father    Stroke Father    Cancer Paternal Grandmother        not sure what type    Social History   Tobacco Use   Smoking status: Never   Smokeless tobacco: Never  Vaping Use   Vaping Use: Never used  Substance Use Topics   Alcohol use: No   Drug use: No    Current Outpatient Medications  Medication Sig Dispense Refill   alum &  mag hydroxide-simeth (MAALOX/MYLANTA) 200-200-20 MG/5ML suspension Take 15 mLs by mouth at bedtime.     calcium carbonate (TUMS - DOSED IN MG ELEMENTAL CALCIUM) 500 MG chewable tablet Chew 1 tablet by mouth 3 (three) times daily as needed for indigestion or heartburn. Reported on 05/19/2015     cetirizine (ZYRTEC) 10 MG tablet Take 10 mg by mouth daily.     gabapentin (NEURONTIN) 300 MG capsule TAKE 1 CAPSULE BY MOUTH 3 TIMES A DAY FOR NEUROPATHY 270 capsule 1   ibuprofen (ADVIL,MOTRIN) 200 MG tablet Take 200 mg by mouth every 6 (six)  hours as needed (pain). Reported on 04/15/2015     OVER THE COUNTER MEDICATION Vitamin B6 100 mg tab daily     pantoprazole (PROTONIX) 40 MG tablet Take 1 tablet (40 mg total) by mouth daily. Take 1/2 hour before supper 90 tablet 4   Prenatal MV-Min-FA-Omega-3 (PRENATAL GUMMIES/DHA & FA PO) Take 2 tablets by mouth daily. Take 2 gummies. One a Day multivitamin     rosuvastatin (CRESTOR) 10 MG tablet Take 10 mg by mouth at bedtime.   3   No current facility-administered medications for this visit.    No Known Allergies  Review of Systems:  Psychiatric/Behavioral: has anxiety or depression     Physical Exam:    BP (!) 132/90   Pulse 75   Ht '5\' 7"'$  (1.702 m)   Wt 207 lb 3.2 oz (94 kg)   BMI 32.45 kg/m  Filed Weights   01/17/22 1424  Weight: 207 lb 3.2 oz (94 kg)  HEENT: No jaundice, pallor. Resp: Bilaterally clear CVS: S1-S2 normal no S3 or S4. Abdomen: Soft nontender bowel sounds present no definite hepatosplenomegaly.  Data Reviewed: I have personally reviewed following labs and imaging studies  CBC:    Latest Ref Rng & Units 07/13/2021   12:00 AM 01/10/2021   12:00 AM 07/07/2020   12:00 AM  CBC  WBC  4.6     5.1     4.3   Hemoglobin 12.0 - 16.0 13.7     13.6     13.9   Hematocrit 36 - 46 42     41     42   Platelets 150 - 400 K/uL 151     162     147      This result is from an external source.    CMP:    Latest Ref Rng & Units 07/13/2021   12:00 AM 01/10/2021   12:00 AM 07/07/2020   12:00 AM  CMP  BUN 4 - '21 14     14     14   '$ Creatinine 0.5 - 1.1 0.8     0.8     0.9   Sodium 137 - 147 141     140     141   Potassium 3.5 - 5.1 mEq/L 4.1     4.0     3.9   Chloride 99 - 108 105     109     106   CO2 13 - '22 27     24     25   '$ Calcium 8.7 - 10.7 9.6     9.2     9.2   Alkaline Phos 25 - 125 60     62     69   AST 13 - 35 37     34     35   ALT 7 - 35 U/L 37  34     33      This result is from an external source.       Carmell Austria, MD 01/17/2022, 2:25  PM  Cc: [name], MD

## 2022-01-18 ENCOUNTER — Other Ambulatory Visit: Payer: Self-pay | Admitting: Hematology and Oncology

## 2022-01-18 DIAGNOSIS — G620 Drug-induced polyneuropathy: Secondary | ICD-10-CM

## 2022-01-27 ENCOUNTER — Telehealth: Payer: Self-pay

## 2022-01-27 NOTE — Patient Outreach (Signed)
  Care Coordination   Initial Visit Note   01/27/2022 Name: Erica Moyer MRN: [id number] DOB: [birth year]  Erica Moyer is a 71 y.o. year old female who sees [name], MD for primary care. I spoke with  Miner by phone today.  What matters to the patients health and wellness today?  Placed call to patient today to review and offer [facility] care coordination program. Patient reports she is doing well and denies any needs  today.    SDOH assessments and interventions completed:  No    Care Coordination Interventions:  No, not indicated   Follow up plan: No further intervention required.   Encounter Outcome:  Pt. Refused   Tomasa Rand, RN, BSN, CEN [facility] [employer] [phone]

## 2022-03-16 ENCOUNTER — Encounter: Payer: Self-pay | Admitting: Gastroenterology

## 2022-03-23 ENCOUNTER — Ambulatory Visit (AMBULATORY_SURGERY_CENTER): Payer: PPO | Admitting: Gastroenterology

## 2022-03-23 ENCOUNTER — Encounter: Payer: Self-pay | Admitting: Gastroenterology

## 2022-03-23 VITALS — BP 155/83 | HR 60 | Temp 96.9°F | Resp 12 | Ht 67.0 in | Wt 207.0 lb

## 2022-03-23 DIAGNOSIS — R194 Change in bowel habit: Secondary | ICD-10-CM | POA: Diagnosis not present

## 2022-03-23 DIAGNOSIS — D122 Benign neoplasm of ascending colon: Secondary | ICD-10-CM | POA: Diagnosis not present

## 2022-03-23 DIAGNOSIS — K449 Diaphragmatic hernia without obstruction or gangrene: Secondary | ICD-10-CM | POA: Diagnosis not present

## 2022-03-23 DIAGNOSIS — K219 Gastro-esophageal reflux disease without esophagitis: Secondary | ICD-10-CM | POA: Diagnosis not present

## 2022-03-23 MED ORDER — SODIUM CHLORIDE 0.9 % IV SOLN: 500.0000 mL | Freq: Once | INTRAVENOUS | Status: DC

## 2022-03-23 NOTE — Progress Notes (Signed)
Chief Complaint: FU  Referring Provider:  [name], MD      ASSESSMENT AND PLAN;    #1. GERD with small HH. Ba swallow 04/2019- small HH, otherwise Nl  #2.  Change in bowel habits with occ incontinence.   #3.  Endometrial adenocarcinoma s/p robotic TAH with BSO 02/2015 followed by RT with recurrence of peritoneum 08/2015 treated with 6 cycles of carboplatin/paclitaxel. Neg CT abdo/pelvis 06/2020 for recurrence.  Plan: - Protonix 40 mg p.o. QD, 1/2 hr before supper #90  4 refills. - Colon feb  2024 with clenpik - Kegel's exercises - Continue Mg supplements for now.    Discussed risks & benefits of colonoscopy. Risks including rare perforation req laparotomy, bleeding after bx/polypectomy req blood transfusion, rarely missing neoplasms, risks of anesthesia/sedation, rare risk of damage to internal organs. Benefits outweigh the risks. Patient agrees to proceed. All the questions were answered. Pt consents to proceed.   For colon today. RG  HPI:    INDICA SOKOLSKI is a 72 y.o. female  [name] Hamilton's mother For follow-up visit.  Doing very well from GI standpoint except for sensation of incomplete evacuation with occasional constipation.  She has started magnesium supplements with good results.  Still has occasional incontinence without any rectal sensation.  No further dysphagia.  She underwent barium swallow which showed small hiatal hernia.  No strictures.  Had negative CT Abdo/pelvis 06/2020 for any acute abnormalities or recurrence.  Alt diarrhea and constipation, Dx with IBS in 2005 No melena or hematochezia.   Wt Readings from Last 3 Encounters:  03/23/22 207 lb (93.9 kg)  01/17/22 207 lb 3.2 oz (94 kg)  07/13/21 210 lb (95.3 kg)     Past GI procedures: -Colonoscopy 09/2013 ([facility])- neg. -CT Abdo/pelvis 04/29/2018 at [facility] negative for any recurrence.  Normal liver, status post cholecystectomy, normal pancreas, spleen mild scarring in the  right upper kidney.  Otherwise unremarkable.  CT AP 06/2020 with contrast: Negative for any recurrence. Past Medical History:  Diagnosis Date   Allergy    seasonal   Arthritis    Cancer ([place])    endometrial cancer   Complication of anesthesia    slow to wake up   Frequency of urination    GERD (gastroesophageal reflux disease)    Headache    hx migraines   Hyperlipidemia    Neuropathy    Osteopenia    PONV (postoperative nausea and vomiting)    Radiation 05/19/15-06/11/15   vaginal cuff 30 Gy   Urgency incontinence     Past Surgical History:  Procedure Laterality Date   ABDOMINAL HYSTERECTOMY     CHOLECYSTECTOMY     COLONOSCOPY     ROBOTIC ASSISTED TOTAL HYSTERECTOMY WITH BILATERAL SALPINGO OOPHERECTOMY Bilateral 02/25/2015   Procedure: XI ROBOTIC ASSISTED TOTAL HYSTERECTOMY WITH BILATERAL SALPINGO OOPHORECTOMY WITH SENTINAL LYMPH NODE MAPPING;  Surgeon: [name], MD;  Location: [facility];  Service: Gynecology;  Laterality: Bilateral;   TUBAL LIGATION     1986    Family History  Problem Relation Age of Onset   Anesthesia problems Mother    Hypertension Mother    Hypertension Father    Stroke Father    Cancer Paternal Grandmother        not sure what type   Colon cancer Neg Hx    Esophageal cancer Neg Hx    Rectal cancer Neg Hx    Stomach cancer Neg Hx     Social History   Tobacco  Use   Smoking status: Never   Smokeless tobacco: Never  Vaping Use   Vaping Use: Never used  Substance Use Topics   Alcohol use: No   Drug use: No    Current Outpatient Medications  Medication Sig Dispense Refill   alum & mag hydroxide-simeth (MAALOX/MYLANTA) 200-200-20 MG/5ML suspension Take 15 mLs by mouth at bedtime.     ascorbic acid (VITAMIN C) 500 MG tablet Take 500 mg by mouth daily.     cetirizine (ZYRTEC) 10 MG tablet Take 10 mg by mouth daily.     Cholecalciferol (VITAMIN D-3) 125 MCG (5000 UT) TABS Take by mouth.     gabapentin (NEURONTIN) 300 MG capsule TAKE 1  CAPSULE BY MOUTH 3 TIMES DAILY FOR NEUROPATHY 270 capsule 1   pantoprazole (PROTONIX) 40 MG tablet Take 1 tablet (40 mg total) by mouth daily. Take 1/2 hour before supper 90 tablet 4   rosuvastatin (CRESTOR) 10 MG tablet Take 10 mg by mouth at bedtime.   3   zinc gluconate 50 MG tablet Take 50 mg by mouth daily.     calcium carbonate (TUMS - DOSED IN MG ELEMENTAL CALCIUM) 500 MG chewable tablet Chew 1 tablet by mouth 3 (three) times daily as needed for indigestion or heartburn. Reported on 05/19/2015     ibuprofen (ADVIL,MOTRIN) 200 MG tablet Take 200 mg by mouth every 6 (six) hours as needed (pain). Reported on 04/15/2015     OVER THE COUNTER MEDICATION Vitamin B6 100 mg tab daily     Prenatal MV-Min-FA-Omega-3 (PRENATAL GUMMIES/DHA & FA PO) Take 2 tablets by mouth daily. Take 2 gummies. One a Day multivitamin     Current Facility-Administered Medications  Medication Dose Route Frequency Provider Last Rate Last Admin   0.9 %  sodium chloride infusion  500 mL Intravenous Once Jackquline Denmark, MD        No Known Allergies  Review of Systems:  Psychiatric/Behavioral: has anxiety or depression     Physical Exam:    BP (!) 148/71   Pulse 71   Temp (!) 96.9 F (36.1 C) (Temporal)   Ht 5' 7"$  (1.702 m)   Wt 207 lb (93.9 kg)   SpO2 99%   BMI 32.42 kg/m  Filed Weights   03/23/22 1027  Weight: 207 lb (93.9 kg)  HEENT: No jaundice, pallor. Resp: Bilaterally clear CVS: S1-S2 normal no S3 or S4. Abdomen: Soft nontender bowel sounds present no definite hepatosplenomegaly.  Data Reviewed: I have personally reviewed following labs and imaging studies  CBC:    Latest Ref Rng & Units 07/13/2021   12:00 AM 01/10/2021   12:00 AM 07/07/2020   12:00 AM  CBC  WBC  4.6     5.1     4.3   Hemoglobin 12.0 - 16.0 13.7     13.6     13.9   Hematocrit 36 - 46 42     41     42   Platelets 150 - 400 K/uL 151     162     147      This result is from an external source.    CMP:    Latest Ref Rng &  Units 07/13/2021   12:00 AM 01/10/2021   12:00 AM 07/07/2020   12:00 AM  CMP  BUN 4 - 21 14     14     14   $ Creatinine 0.5 - 1.1 0.8     0.8  0.9   Sodium 137 - 147 141     140     141   Potassium 3.5 - 5.1 mEq/L 4.1     4.0     3.9   Chloride 99 - 108 105     109     106   CO2 13 - 22 27     24     25   $ Calcium 8.7 - 10.7 9.6     9.2     9.2   Alkaline Phos 25 - 125 60     62     69   AST 13 - 35 37     34     35   ALT 7 - 35 U/L 37     34     33      This result is from an external source.       Carmell Austria, MD 03/23/2022, 11:01 AM  Cc: [name], MD

## 2022-03-23 NOTE — Patient Instructions (Signed)
Handouts on hemorrhoids and polyps given to you today  Await pathology results   YOU HAD AN ENDOSCOPIC PROCEDURE TODAY AT THE [facility]:   Refer to the procedure report that was given to you for any specific questions about what was found during the examination.  If the procedure report does not answer your questions, please call your gastroenterologist to clarify.  If you requested that your care partner not be given the details of your procedure findings, then the procedure report has been included in a sealed envelope for you to review at your convenience later.  YOU SHOULD EXPECT: Some feelings of bloating in the abdomen. Passage of more gas than usual.  Walking can help get rid of the air that was put into your GI tract during the procedure and reduce the bloating. If you had a lower endoscopy (such as a colonoscopy or flexible sigmoidoscopy) you may notice spotting of blood in your stool or on the toilet paper. If you underwent a bowel prep for your procedure, you may not have a normal bowel movement for a few days.  Please Note:  You might notice some irritation and congestion in your nose or some drainage.  This is from the oxygen used during your procedure.  There is no need for concern and it should clear up in a day or so.  SYMPTOMS TO REPORT IMMEDIATELY:  Following lower endoscopy (colonoscopy or flexible sigmoidoscopy):  Excessive amounts of blood in the stool  Significant tenderness or worsening of abdominal pains  Swelling of the abdomen that is new, acute  Fever of 100F or higher  For urgent or emergent issues, a gastroenterologist can be reached at any hour by calling [phone]. Do not use MyChart messaging for urgent concerns.    DIET:  We do recommend a small meal at first, but then you may proceed to your regular diet.  Drink plenty of fluids but you should avoid alcoholic beverages for 24 hours.  ACTIVITY:  You should plan to take it easy for the rest  of today and you should NOT DRIVE or use heavy machinery until tomorrow (because of the sedation medicines used during the test).    FOLLOW UP: Our staff will call the number listed on your records the next business day following your procedure.  We will call around 7:15- 8:00 am to check on you and address any questions or concerns that you may have regarding the information given to you following your procedure. If we do not reach you, we will leave a message.     If any biopsies were taken you will be contacted by phone or by letter within the next 1-3 weeks.  Please call us at [phone] if you have not heard about the biopsies in 3 weeks.    SIGNATURES/CONFIDENTIALITY: You and/or your care partner have signed paperwork which will be entered into your electronic medical record.  These signatures attest to the fact that that the information above on your After Visit Summary has been reviewed and is understood.  Full responsibility of the confidentiality of this discharge information lies with you and/or your care-partner. 

## 2022-03-23 NOTE — Progress Notes (Signed)
Called to room to assist during endoscopic procedure.  Patient ID and intended procedure confirmed with present staff. Received instructions for my participation in the procedure from the performing physician.  

## 2022-03-23 NOTE — Progress Notes (Signed)
Vss nad trans to pacu °

## 2022-03-23 NOTE — Op Note (Signed)
[facility] Patient Name: Erica Moyer Procedure Date: 03/23/2022 10:58 AM MRN: [id number] Endoscopist: Jackquline Denmark , MD, [id number] Age: 72 Referring MD:  Date of Birth: 1950-07-01 Gender: Female Account #: [id number] Procedure:                Colonoscopy Indications:              Change in bowel habits. Occasional incontinence. Medicines:                Monitored Anesthesia Care Procedure:                Pre-Anesthesia Assessment:                           - Prior to the procedure, a History and Physical                            was performed, and patient medications and                            allergies were reviewed. The patient's tolerance of                            previous anesthesia was also reviewed. The risks                            and benefits of the procedure and the sedation                            options and risks were discussed with the patient.                            All questions were answered, and informed consent                            was obtained. Prior Anticoagulants: The patient has                            taken no anticoagulant or antiplatelet agents. ASA                            Grade Assessment: II - A patient with mild systemic                            disease. After reviewing the risks and benefits,                            the patient was deemed in satisfactory condition to                            undergo the procedure.                           After obtaining informed consent, the colonoscope  was passed under direct vision. Throughout the                            procedure, the patient's blood pressure, pulse, and                            oxygen saturations were monitored continuously. The                            [id number]-HQ190L Colonoscope [id number] was introduced                            through the anus and advanced to the 2 cm into the                            ileum.  The colonoscopy was performed without                            difficulty. The patient tolerated the procedure                            well. The quality of the bowel preparation was                            good. The terminal ileum, ileocecal valve,                            appendiceal orifice, and rectum were photographed. Scope In: 11:12:11 AM Scope Out: 11:28:05 AM Scope Withdrawal Time: 0 hours 9 minutes 58 seconds  Total Procedure Duration: 0 hours 15 minutes 54 seconds  Findings:                 A 4 mm polyp was found in the mid ascending colon.                            The polyp was sessile. The polyp was removed with a                            cold snare. Resection and retrieval were complete.                           The colon (entire examined portion) appeared                            normal. Biopsies for histology were taken with a                            cold forceps from the entire colon for evaluation                            of microscopic colitis.                           The terminal ileum  appeared normal.                           Non-bleeding internal hemorrhoids were found during                            retroflexion. The hemorrhoids were small and Grade                            I (internal hemorrhoids that do not prolapse). On                            rectal exam she had good rectal tone.                           The exam was otherwise without abnormality on                            direct and retroflexion views. Complications:            No immediate complications. Estimated Blood Loss:     Estimated blood loss: none. Impression:               - One 4 mm polyp in the mid ascending colon,                            removed with a cold snare. Resected and retrieved.                           - The entire examined colon is normal. Biopsied.                           - The examined portion of the ileum was normal.                            - Non-bleeding internal hemorrhoids.                           - The examination was otherwise normal on direct                            and retroflexion views. Recommendation:           - Patient has a contact number available for                            emergencies. The signs and symptoms of potential                            delayed complications were discussed with the                            patient. Return to normal activities tomorrow.                            Written discharge instructions  were provided to the                            patient.                           - Resume previous diet.                           - Continue present medications.                           - Await pathology results.                           - Repeat colonoscopy is not recommended for                            screening purposes.                           - The findings and recommendations were discussed                            with the patient's family. Jackquline Denmark, MD 03/23/2022 11:34:39 AM This report has been signed electronically.

## 2022-03-24 ENCOUNTER — Telehealth: Payer: Self-pay

## 2022-03-24 NOTE — Telephone Encounter (Signed)
  Follow up Call-     03/23/2022   10:28 AM  Call back number  Post procedure Call Back phone  # [phone]  Permission to leave phone message Yes     Patient questions:  Do you have a fever, pain , or abdominal swelling? No. Pain Score  0 *  Have you tolerated food without any problems? Yes.    Have you been able to return to your normal activities? Yes.    Do you have any questions about your discharge instructions: Diet   No. Medications  No. Follow up visit  No.  Do you have questions or concerns about your Care? No.  Actions: * If pain score is 4 or above: No action needed, pain <4.

## 2022-03-30 ENCOUNTER — Encounter: Payer: Self-pay | Admitting: Gastroenterology

## 2022-04-05 DIAGNOSIS — Z124 Encounter for screening for malignant neoplasm of cervix: Secondary | ICD-10-CM | POA: Diagnosis not present

## 2022-04-05 DIAGNOSIS — E7800 Pure hypercholesterolemia, unspecified: Secondary | ICD-10-CM | POA: Diagnosis not present

## 2022-04-05 DIAGNOSIS — Z6832 Body mass index (BMI) 32.0-32.9, adult: Secondary | ICD-10-CM | POA: Diagnosis not present

## 2022-05-05 DIAGNOSIS — Z1231 Encounter for screening mammogram for malignant neoplasm of breast: Secondary | ICD-10-CM | POA: Diagnosis not present

## 2022-07-12 NOTE — Progress Notes (Signed)
[facility]  [address] [place],  [place]  [address] [phone]  Clinic Day: 07/14/22  Referring physician: Marylen Ponto, MD   CHIEF COMPLAINT:  CC:    Recurrent endometrial cancer  Current Treatment:   Observation   HISTORY OF PRESENT ILLNESS:  AUTIE VASUDEVAN is a 72 y.o. female with stage IA (T1 N0 M0) endometrioid adenocarcinoma treated with robotic laparoscopic hysterectomy and bilateral salpingo-oophorectomy in January 2017.  Pathology revealed a 2.4 cm, grade 3, endometrioid adenocarcinoma with squamous differentiation.  There was superficial myometrial invasion.  Focal lymphovascular invasion was seen.  Seven nodes were negative for metastasis.  She received adjuvant radiotherapy completed in April 2017.   She developed abdominal pain and change in her stool caliber in June.  She saw Dr. Roselind Messier in July 2017 and pelvic examination was normal.  CT abdomen and pelvis revealed recurrent endometrial carcinoma in the peritoneum with associated left hydronephrosis.  Biopsy in August revealed poorly differentiated carcinoma consistent with her endometrial primary.  She saw Dr. Stanford Breed, who recommended palliative chemotherapy with carboplatin/paclitaxel.  We began seeing her in August 2017 to initiate chemotherapy.  She received 6 cycles of carboplatin/paclitaxel completed in December 2017 with an excellent response.  She had difficulty tolerating chemotherapy due to anorexia, nausea, dehydration, neuropathy and hypomagnesemia.  She required IV and oral magnesium replacement.  She developed peripheral neuropathy of the hands and feet and was given gabapentin 300 mg three times daily and pyridoxine 100 mg daily.  Repeat imaging and tumor markers have remained normal since completing chemotherapy.  Colonoscopy in August 2015 was normal, so repeat 10 years was recommended.    Bone density scan in February 2019 revealed worsening osteopenia with a  T-score of -1.7 in the spine and a T-score of -1.8 in the femur, for which she is on calcium and vitamin-D.  In May 2019, Dr. Leonor Liv ordered left diagnostic mammogram and ultrasound for a possible breast mass, which did not reveal any evidence of malignancy.  CT abdomen and pelvis in September 2019 revealed a stable left lower lobe lung nodule without evidence of recurrent or metastatic disease. Bilateral screening mammogram in January 2020 did not reveal any evidence of malignancy.  CT abdomen and pelvis in March and September 2020 remained without evidence of recurrence, with a stable left lower lobe lung nodule.  Her last bone density scan was in February 2021 at Dr. Elenor Legato office.  CT abdomen and pelvis from May 2021 did not reveal any findings to suggest residual or recurrent disease.  The 4 mm left lower lobe lung nodule remained stable. CEA and CA 125 remained normal in January and September 2021. At her visit in February 2022, she was doing well and the CEA and CA 125 remained normal. Bilateral screening mammogram in February 2022 did not reveal any evidence of malignancy.  CT abdomen and pelvis from May 2022 revealed no findings of active or recurrent malignancy, and stable 4 mm right lower lobe pulmonary nodule.  INTERVAL HISTORY:  Kharizma is here for routine follow up for recurrent endometrial cancer. Patient states that she feels well and states that her balance is off and her right ankle has been swelling some 3-4 times a month and goes down at times. I noticed that her right calf is tighter then the left so I will order a ultrasound doppler of the right lower extremity this afternoon at 1:30. She still experiences neuropathy in her lower extremities. She is still  trying to get over some symptoms of COVID that she had in December, 2023. Her labs today are pending and I will call her back with those results. Harvin Hazel will see her back in 1 year with CBC, CMP, CEA and CA 125 for longterm survivorship. She  denies signs of infection such as sore throat, sinus drainage, cough, or urinary symptoms.  She denies fevers or recurrent chills. She denies pain. She denies nausea, vomiting, chest pain, dyspnea or cough. Her appetite is good and her weight has increased 6 pounds over last 3 months  REVIEW OF SYSTEMS:  Review of Systems  Constitutional: Negative.  Negative for appetite change, chills, diaphoresis, fatigue, fever and unexpected weight change.  HENT:  Negative.  Negative for hearing loss, lump/mass, mouth sores, nosebleeds, sore throat, tinnitus, trouble swallowing and voice change.   Eyes: Negative.  Negative for eye problems and icterus.  Respiratory: Negative.  Negative for chest tightness, cough, hemoptysis, shortness of breath and wheezing.   Cardiovascular:  Positive for leg swelling (ankle/foot swelling). Negative for chest pain and palpitations.  Gastrointestinal: Negative.  Negative for abdominal distention, abdominal pain, blood in stool, constipation, diarrhea, nausea, rectal pain and vomiting.  Endocrine: Negative.   Genitourinary: Negative.  Negative for bladder incontinence, difficulty urinating, dyspareunia, dysuria, frequency, hematuria, menstrual problem, nocturia, pelvic pain, vaginal bleeding and vaginal discharge.   Musculoskeletal: Negative.  Negative for arthralgias, back pain, flank pain, gait problem, myalgias, neck pain and neck stiffness.  Skin: Negative.  Negative for itching, rash and wound.  Neurological:  Positive for numbness. Negative for dizziness, extremity weakness, gait problem, headaches, light-headedness, seizures and speech difficulty.       Off balance  Hematological: Negative.  Negative for adenopathy. Does not bruise/bleed easily.  Psychiatric/Behavioral: Negative.  Negative for confusion, decreased concentration, depression, sleep disturbance and suicidal ideas. The patient is not nervous/anxious.    VITALS:  Blood pressure 136/76, pulse 67, temperature  98.2 F (36.8 C), temperature source Oral, resp. rate 18, height 5\' 7"  (1.702 m), weight 211 lb 6.4 oz (95.9 kg), SpO2 100 %.  Wt Readings from Last 3 Encounters:  07/14/22 211 lb 6.4 oz (95.9 kg)  03/23/22 207 lb (93.9 kg)  01/17/22 207 lb 3.2 oz (94 kg)    Body mass index is 33.11 kg/m.  Performance status (ECOG): 0 - Asymptomatic  PHYSICAL EXAM:  Physical Exam Vitals and nursing note reviewed.  Constitutional:      General: She is not in acute distress.    Appearance: Normal appearance. She is normal weight. She is not ill-appearing, toxic-appearing or diaphoretic.  HENT:     Head: Normocephalic and atraumatic.     Right Ear: Tympanic membrane, ear canal and external ear normal. There is no impacted cerumen.     Left Ear: Tympanic membrane, ear canal and external ear normal. There is no impacted cerumen.     Nose: Nose normal. No congestion or rhinorrhea.     Mouth/Throat:     Mouth: Mucous membranes are moist.     Pharynx: Oropharynx is clear. No oropharyngeal exudate or posterior oropharyngeal erythema.  Eyes:     General: No scleral icterus.       Right eye: No discharge.        Left eye: No discharge.     Extraocular Movements: Extraocular movements intact.     Conjunctiva/sclera: Conjunctivae normal.     Pupils: Pupils are equal, round, and reactive to light.  Neck:     Vascular: No carotid  bruit.  Cardiovascular:     Rate and Rhythm: Normal rate and regular rhythm.     Pulses: Normal pulses.     Heart sounds: Normal heart sounds. No murmur heard.    No friction rub. No gallop.  Pulmonary:     Effort: Pulmonary effort is normal. No respiratory distress.     Breath sounds: Normal breath sounds. No stridor. No wheezing, rhonchi or rales.  Chest:     Chest wall: No tenderness.  Abdominal:     General: Bowel sounds are normal. There is no distension.     Palpations: Abdomen is soft. There is no hepatomegaly, splenomegaly or mass.     Tenderness: There is no  abdominal tenderness. There is no right CVA tenderness, left CVA tenderness, guarding or rebound.     Hernia: No hernia is present.  Musculoskeletal:        General: No swelling, tenderness, deformity or signs of injury. Normal range of motion.     Cervical back: Normal range of motion and neck supple. No rigidity or tenderness.     Right lower leg: Edema present.     Left lower leg: No edema.     Comments: She has 1+ edema of the right ankle with some edema of the foot. She had tightness of the right calf but the measurements of the circumference were the same on both legs.   Lymphadenopathy:     Cervical: No cervical adenopathy.  Skin:    General: Skin is warm and dry.     Coloration: Skin is not jaundiced or pale.     Findings: No bruising, erythema, lesion or rash.  Neurological:     General: No focal deficit present.     Mental Status: She is alert and oriented to person, place, and time. Mental status is at baseline.     Cranial Nerves: No cranial nerve deficit.     Sensory: No sensory deficit.     Motor: No weakness.     Coordination: Coordination normal.     Gait: Gait normal.     Deep Tendon Reflexes: Reflexes normal.  Psychiatric:        Mood and Affect: Mood normal.        Behavior: Behavior normal.        Thought Content: Thought content normal.        Judgment: Judgment normal.    LABS:      Latest Ref Rng & Units 07/14/2022   10:52 AM 07/13/2021   12:00 AM 01/10/2021   12:00 AM  CBC  WBC 4.0 - 10.5 K/uL 4.9  4.6     5.1      Hemoglobin 12.0 - 15.0 g/dL 81.1  91.4     78.2      Hematocrit 36.0 - 46.0 % 41.5  42     41      Platelets 150 - 400 K/uL 166  151     162         This result is from an external source.      Latest Ref Rng & Units 07/14/2022   10:52 AM 07/13/2021   12:00 AM 01/10/2021   12:00 AM  CMP  Glucose 70 - 99 mg/dL 98     BUN 8 - 23 mg/dL 13  14     14       Creatinine 0.44 - 1.00 mg/dL 9.56  0.8     0.8      Sodium 135 -  145 mmol/L 140   141     140      Potassium 3.5 - 5.1 mmol/L 4.3  4.1     4.0      Chloride 98 - 111 mmol/L 106  105     109      CO2 22 - 32 mmol/L 26  27     24       Calcium 8.9 - 10.3 mg/dL 9.5  9.6     9.2      Total Protein 6.5 - 8.1 g/dL 7.4     Total Bilirubin 0.3 - 1.2 mg/dL 1.0     Alkaline Phos 38 - 126 U/L 53  60     62      AST 15 - 41 U/L 27  37     34      ALT 0 - 44 U/L 32  37     34         This result is from an external source.   Component Ref Range & Units 07/14/2022  Cancer Antigen (CA) 125 0.0 - 38.1 U/mL 8.7   Lab Results  Component Value Date   CEA1 <0.6 07/14/2022   /  CEA  Date Value Ref Range Status  07/14/2022 <0.6 0.0 - 4.7 ng/mL Final    Comment:    (NOTE)                             Nonsmokers          <3.9                             Smokers             <5.6 Roche [employer] Electrochemiluminescence Immunoassay (ECLIA) Values obtained with different assay methods or kits cannot be used interchangeably.  Results cannot be interpreted as absolute evidence of the presence or absence of malignant disease. Performed At: [facility] [address] [place], [place] [id number] Jolene Schimke MD WU:[id number]      STUDIES:  No results found.   HISTORY:   Allergies: No Known Allergies  Current Medications: Current Outpatient Medications  Medication Sig Dispense Refill   alum & mag hydroxide-simeth (MAALOX/MYLANTA) 200-200-20 MG/5ML suspension Take 15 mLs by mouth at bedtime.     ascorbic acid (VITAMIN C) 500 MG tablet Take 500 mg by mouth daily.     calcium carbonate (TUMS - DOSED IN MG ELEMENTAL CALCIUM) 500 MG chewable tablet Chew 1 tablet by mouth 3 (three) times daily as needed for indigestion or heartburn. Reported on 05/19/2015     cetirizine (ZYRTEC) 10 MG tablet Take 10 mg by mouth daily.     Cholecalciferol (VITAMIN D-3) 125 MCG (5000 UT) TABS Take by mouth.     gabapentin (NEURONTIN) 300 MG capsule TAKE 1 CAPSULE BY MOUTH 3 TIMES DAILY  FOR NEUROPATHY 270 capsule 1   ibuprofen (ADVIL,MOTRIN) 200 MG tablet Take 200 mg by mouth every 6 (six) hours as needed (pain). Reported on 04/15/2015     OVER THE COUNTER MEDICATION Vitamin B6 100 mg tab daily     pantoprazole (PROTONIX) 40 MG tablet Take 1 tablet (40 mg total) by mouth daily. Take 1/2 hour before supper 90 tablet 4   Prenatal MV-Min-FA-Omega-3 (PRENATAL GUMMIES/DHA & FA PO) Take 2 tablets by mouth daily. Take 2 gummies. One a Day multivitamin  rosuvastatin (CRESTOR) 10 MG tablet Take 10 mg by mouth at bedtime.   3   zinc gluconate 50 MG tablet Take 50 mg by mouth daily.     No current facility-administered medications for this visit.     ASSESSMENT & PLAN:  Assessment:  1. History of recurrent endometrial cancer in the peritoneum treated with chemotherapy with a complete response.  She remains without evidence of disease, now off chemotherapy for 5 years. Her insurance will no longer cover annual CT scans for surveillance despite having had recurrence.   2. Osteopenia, for which she is on calcium and vitamin-D.  She is up to date on bone density scan and has these done at Dr. Elenor Legato office.    3. Peripheral neuropathy secondary to chemotherapy, which is stable.  She continues gabapentin 300 mg 3 times daily.   4. Lung nodule of the left lower lobe, which measures 4 mm and has remained stable on CT imaging.  Plan:      I noticed that her right calf is tighter then the left and she had asymmetrical edema of the right ankle and foot so I will order a ultrasound doppler of the right lower extremity this afternoon at 1:30. She still experiences neuropathy in her lower extremities. She is still trying to get over some symptoms of COVID that she had in December, 2023. Her labs today are pending and I will call her back with those results. Harvin Hazel will see her back in 1 year with CBC, CMP, CEA and CA 125 for long term survivorship. The patient understands the plans discussed today  and is in agreement with them.  She knows to contact our office if she develops concerns prior to her next appointment.  I provided 25 minutes of face-to-face time during this this encounter and > 50% was spent counseling as documented under my assessment and plan.    Dellia Beckwith, MD [facility] [address] [place] [address] Dept: [phone] Dept Fax: [phone]    Rulon Sera Lassiter,acting as a scribe for Dellia Beckwith, MD.,have documented all relevant documentation on the behalf of Dellia Beckwith, MD,as directed by  Dellia Beckwith, MD while in the presence of Dellia Beckwith, MD.

## 2022-07-14 ENCOUNTER — Encounter: Payer: Self-pay | Admitting: Oncology

## 2022-07-14 ENCOUNTER — Other Ambulatory Visit: Payer: Self-pay | Admitting: Oncology

## 2022-07-14 ENCOUNTER — Inpatient Hospital Stay: Payer: PPO | Attending: Oncology | Admitting: Oncology

## 2022-07-14 ENCOUNTER — Inpatient Hospital Stay: Payer: PPO

## 2022-07-14 ENCOUNTER — Telehealth: Payer: Self-pay

## 2022-07-14 VITALS — BP 136/76 | HR 67 | Temp 98.2°F | Resp 18 | Ht 67.0 in | Wt 211.4 lb

## 2022-07-14 DIAGNOSIS — C541 Malignant neoplasm of endometrium: Secondary | ICD-10-CM

## 2022-07-14 DIAGNOSIS — Z8542 Personal history of malignant neoplasm of other parts of uterus: Secondary | ICD-10-CM | POA: Diagnosis present

## 2022-07-14 DIAGNOSIS — Z90722 Acquired absence of ovaries, bilateral: Secondary | ICD-10-CM | POA: Insufficient documentation

## 2022-07-14 DIAGNOSIS — Z90710 Acquired absence of both cervix and uterus: Secondary | ICD-10-CM | POA: Diagnosis not present

## 2022-07-14 DIAGNOSIS — Z9079 Acquired absence of other genital organ(s): Secondary | ICD-10-CM | POA: Insufficient documentation

## 2022-07-14 DIAGNOSIS — R911 Solitary pulmonary nodule: Secondary | ICD-10-CM | POA: Insufficient documentation

## 2022-07-14 DIAGNOSIS — M8580 Other specified disorders of bone density and structure, unspecified site: Secondary | ICD-10-CM | POA: Insufficient documentation

## 2022-07-14 DIAGNOSIS — M7989 Other specified soft tissue disorders: Secondary | ICD-10-CM

## 2022-07-14 DIAGNOSIS — Z923 Personal history of irradiation: Secondary | ICD-10-CM | POA: Diagnosis not present

## 2022-07-14 DIAGNOSIS — M79661 Pain in right lower leg: Secondary | ICD-10-CM | POA: Diagnosis not present

## 2022-07-14 DIAGNOSIS — C786 Secondary malignant neoplasm of retroperitoneum and peritoneum: Secondary | ICD-10-CM | POA: Diagnosis not present

## 2022-07-14 LAB — CMP (CANCER CENTER ONLY)
ALT: 32 U/L (ref 0–44)
AST: 27 U/L (ref 15–41)
Albumin: 4.1 g/dL (ref 3.5–5.0)
Alkaline Phosphatase: 53 U/L (ref 38–126)
Anion gap: 8 (ref 5–15)
BUN: 13 mg/dL (ref 8–23)
CO2: 26 mmol/L (ref 22–32)
Calcium: 9.5 mg/dL (ref 8.9–10.3)
Chloride: 106 mmol/L (ref 98–111)
Creatinine: 0.96 mg/dL (ref 0.44–1.00)
GFR, Estimated: >60 mL/min (ref >60)
Glucose, Bld: 98 mg/dL (ref 70–99)
Potassium: 4.3 mmol/L (ref 3.5–5.1)
Sodium: 140 mmol/L (ref 135–145)
Total Bilirubin: 1 mg/dL (ref 0.3–1.2)
Total Protein: 7.4 g/dL (ref 6.5–8.1)

## 2022-07-14 LAB — CBC WITH DIFFERENTIAL (CANCER CENTER ONLY)
Abs Immature Granulocytes: 0.02 10*3/uL (ref 0.00–0.07)
Basophils Absolute: 0.1 10*3/uL (ref 0.0–0.1)
Basophils Relative: 1 %
Eosinophils Absolute: 0.1 10*3/uL (ref 0.0–0.5)
Eosinophils Relative: 3 %
HCT: 41.5 % (ref 36.0–46.0)
Hemoglobin: 13.5 g/dL (ref 12.0–15.0)
Immature Granulocytes: 0 %
Lymphocytes Relative: 32 %
Lymphs Abs: 1.6 10*3/uL (ref 0.7–4.0)
MCH: 28.1 pg (ref 26.0–34.0)
MCHC: 32.5 g/dL (ref 30.0–36.0)
MCV: 86.5 fL (ref 80.0–100.0)
Monocytes Absolute: 0.4 10*3/uL (ref 0.1–1.0)
Monocytes Relative: 9 %
Neutro Abs: 2.7 10*3/uL (ref 1.7–7.7)
Neutrophils Relative %: 55 %
Platelet Count: 166 10*3/uL (ref 150–400)
RBC: 4.8 MIL/uL (ref 3.87–5.11)
RDW: 13.2 % (ref 11.5–15.5)
WBC Count: 4.9 10*3/uL (ref 4.0–10.5)
nRBC: 0 % (ref 0.0–0.2)

## 2022-07-14 NOTE — Telephone Encounter (Signed)
Called patient and notified her of her labs and U/S results were negative.

## 2022-07-14 NOTE — Telephone Encounter (Signed)
-----   Message from Dellia Beckwith, MD sent at 07/14/2022  2:33 PM EDT ----- Regarding: call Tell her labs all look great, incl BS 101

## 2022-07-16 LAB — CEA: CEA: <0.6 ng/mL (ref 0.0–4.7)

## 2022-07-16 LAB — CA 125: Cancer Antigen (CA) 125: 7.7 U/mL (ref 0.0–38.1)

## 2022-07-20 ENCOUNTER — Telehealth: Payer: Self-pay

## 2022-07-20 NOTE — Telephone Encounter (Signed)
Attempted to contact patient. No answer. 

## 2022-07-20 NOTE — Telephone Encounter (Signed)
-----   Message from Dellia Beckwith, MD sent at 07/18/2022  7:26 PM EDT ----- Regarding: call Tell her labs all look good

## 2022-07-21 ENCOUNTER — Telehealth: Payer: Self-pay

## 2022-07-21 NOTE — Telephone Encounter (Signed)
-----   Message from Christine H McCarty, MD sent at 07/18/2022  7:26 PM EDT ----- Regarding: call Tell her labs all look good  

## 2022-07-21 NOTE — Telephone Encounter (Signed)
Called patient and notified her of lab results. 

## 2022-07-25 ENCOUNTER — Telehealth: Payer: Self-pay

## 2022-07-25 ENCOUNTER — Encounter: Payer: Self-pay | Admitting: Oncology

## 2022-07-25 NOTE — Telephone Encounter (Signed)
-----   Message from Dellia Beckwith, MD sent at 07/25/2022  9:14 AM EDT ----- Regarding: call Tell Erica Moyer [place] was neg for blood clot

## 2022-07-25 NOTE — Telephone Encounter (Signed)
Patient already notified of results..

## 2022-07-26 ENCOUNTER — Other Ambulatory Visit: Payer: Self-pay | Admitting: Oncology

## 2022-07-26 DIAGNOSIS — G620 Drug-induced polyneuropathy: Secondary | ICD-10-CM

## 2022-07-26 NOTE — Telephone Encounter (Signed)
neuropathy

## 2022-08-07 DIAGNOSIS — H2513 Age-related nuclear cataract, bilateral: Secondary | ICD-10-CM | POA: Diagnosis not present

## 2022-10-18 DIAGNOSIS — Z6832 Body mass index (BMI) 32.0-32.9, adult: Secondary | ICD-10-CM | POA: Diagnosis not present

## 2022-10-18 DIAGNOSIS — Z0000 Encounter for general adult medical examination without abnormal findings: Secondary | ICD-10-CM | POA: Diagnosis not present

## 2022-10-18 DIAGNOSIS — Z79899 Other long term (current) drug therapy: Secondary | ICD-10-CM | POA: Diagnosis not present

## 2022-10-18 DIAGNOSIS — Z1339 Encounter for screening examination for other mental health and behavioral disorders: Secondary | ICD-10-CM | POA: Diagnosis not present

## 2022-10-18 DIAGNOSIS — E559 Vitamin D deficiency, unspecified: Secondary | ICD-10-CM | POA: Diagnosis not present

## 2022-10-18 DIAGNOSIS — M8580 Other specified disorders of bone density and structure, unspecified site: Secondary | ICD-10-CM | POA: Diagnosis not present

## 2022-10-18 DIAGNOSIS — E7800 Pure hypercholesterolemia, unspecified: Secondary | ICD-10-CM | POA: Diagnosis not present

## 2022-10-18 DIAGNOSIS — Z1331 Encounter for screening for depression: Secondary | ICD-10-CM | POA: Diagnosis not present

## 2022-11-06 DIAGNOSIS — L814 Other melanin hyperpigmentation: Secondary | ICD-10-CM | POA: Diagnosis not present

## 2022-11-06 DIAGNOSIS — L821 Other seborrheic keratosis: Secondary | ICD-10-CM | POA: Diagnosis not present

## 2022-11-06 DIAGNOSIS — D225 Melanocytic nevi of trunk: Secondary | ICD-10-CM | POA: Diagnosis not present

## 2022-11-06 DIAGNOSIS — D2239 Melanocytic nevi of other parts of face: Secondary | ICD-10-CM | POA: Diagnosis not present

## 2022-11-30 DIAGNOSIS — Z23 Encounter for immunization: Secondary | ICD-10-CM | POA: Diagnosis not present

## 2023-02-26 ENCOUNTER — Other Ambulatory Visit: Payer: Self-pay | Admitting: Gastroenterology

## 2023-03-14 DIAGNOSIS — L820 Inflamed seborrheic keratosis: Secondary | ICD-10-CM | POA: Diagnosis not present

## 2023-04-10 DIAGNOSIS — Z124 Encounter for screening for malignant neoplasm of cervix: Secondary | ICD-10-CM | POA: Diagnosis not present

## 2023-04-10 DIAGNOSIS — Z6833 Body mass index (BMI) 33.0-33.9, adult: Secondary | ICD-10-CM | POA: Diagnosis not present

## 2023-04-10 DIAGNOSIS — E7800 Pure hypercholesterolemia, unspecified: Secondary | ICD-10-CM | POA: Diagnosis not present

## 2023-04-10 DIAGNOSIS — Z79899 Other long term (current) drug therapy: Secondary | ICD-10-CM | POA: Diagnosis not present

## 2023-04-10 DIAGNOSIS — E569 Vitamin deficiency, unspecified: Secondary | ICD-10-CM | POA: Diagnosis not present

## 2023-05-17 DIAGNOSIS — Z1231 Encounter for screening mammogram for malignant neoplasm of breast: Secondary | ICD-10-CM | POA: Diagnosis not present

## 2023-06-05 ENCOUNTER — Ambulatory Visit: Payer: PPO | Admitting: Gastroenterology

## 2023-06-05 ENCOUNTER — Encounter: Payer: Self-pay | Admitting: Gastroenterology

## 2023-06-05 VITALS — BP 120/80 | HR 68 | Ht 67.0 in | Wt 212.0 lb

## 2023-06-05 DIAGNOSIS — C541 Malignant neoplasm of endometrium: Secondary | ICD-10-CM

## 2023-06-05 DIAGNOSIS — K219 Gastro-esophageal reflux disease without esophagitis: Secondary | ICD-10-CM

## 2023-06-05 DIAGNOSIS — K582 Mixed irritable bowel syndrome: Secondary | ICD-10-CM

## 2023-06-05 DIAGNOSIS — K449 Diaphragmatic hernia without obstruction or gangrene: Secondary | ICD-10-CM | POA: Diagnosis not present

## 2023-06-05 MED ORDER — PANTOPRAZOLE SODIUM 40 MG PO TBEC: 40.0000 mg | DELAYED_RELEASE_TABLET | Freq: Every day | ORAL | 3 refills | Status: AC

## 2023-06-05 NOTE — Progress Notes (Signed)
 Chief Complaint: FU  Referring Provider:  Gaither Juba, MD      ASSESSMENT AND PLAN;    #1. GERD with small HH. Ba swallow 04/2019- small HH, otherwise Nl  #2.  IBS with alt diarrhea/constipation.   #3.  Endometrial adenocarcinoma s/p robotic TAH with BSO 02/2015 followed by RT with recurrence in peritoneum 08/2015 treated with 6 cycles of carboplatin /paclitaxel . Neg CT abdo/pelvis 06/2020 for recurrence.  Plan: - Continue Protonix  40 mg p.o. QD, 1/2 hr before supper #90  4 refills. - Continue Mg supplements for now. - FU with Kelly/Dr Almer Jacobson at [facility] (she has appt) - FU GI PRN    HPI:    Erica Moyer is a 73 y.o. female  Erica Moyer's mother For follow-up visit. Doing well from GI standpoint. Has breakthrough symptoms if she misses a single dose of Protonix . She has tried to reduce it to every other day previously without success. She still has to use Mylanta at times.  Denies having any lower GI problems.     History of Present Illness   No dysphagia.  She underwent barium swallow which showed small hiatal hernia.  No strictures.  Had negative CT Abdo/pelvis 06/2020 for any acute abnormalities or recurrence.  Alt diarrhea and constipation, Dx with IBS in 2005 No melena or hematochezia.   Wt Readings from Last 3 Encounters:  06/05/23 212 lb (96.2 kg)  07/14/22 211 lb 6.4 oz (95.9 kg)  03/23/22 207 lb (93.9 kg)     Past GI procedures: -Colonoscopy 03/2022 - One 4 mm polyp in the mid ascending colon, removed with a cold snare. Resected and retrieved. - The entire examined colon is normal. Biopsied. - The examined portion of the ileum was normal. -No need to rpt  09/2013 (PCF)- neg. -CT Abdo[facility]pelvis 04/29/2018 at [facility] negative for any recurrence.  Normal liver, status post cholecystectomy, normal pancreas, spleen mild scarring in the right upper kidney.  Otherwise unremarkable.  CT AP 06/2020 with contrast: Negative for any  recurrence. Past Medical History:  Diagnosis Date   Allergy    seasonal   Arthritis    Cancer (HCC)    endometrial cancer   Complication of anesthesia    slow to wake up   Frequency of urination    GERD (gastroesophageal reflux disease)    Headache    hx migraines   Hyperlipidemia    Neuropathy    Osteopenia    PONV (postoperative nausea and vomiting)    Radiation 05/19/15-06/11/15   vaginal cuff 30 Gy   Urgency incontinence     Past Surgical History:  Procedure Laterality Date   ABDOMINAL HYSTERECTOMY     CHOLECYSTECTOMY     COLONOSCOPY     ROBOTIC ASSISTED TOTAL HYSTERECTOMY WITH BILATERAL SALPINGO OOPHERECTOMY Bilateral 02/25/2015   Procedure: XI ROBOTIC ASSISTED TOTAL HYSTERECTOMY WITH BILATERAL SALPINGO OOPHORECTOMY WITH SENTINAL LYMPH NODE MAPPING;  Surgeon: Terry Ficks, MD;  Location: [facility];  Service: Gynecology;  Laterality: Bilateral;   TUBAL LIGATION     1986    Family History  Problem Relation Age of Onset   Anesthesia problems Mother    Hypertension Mother    Hypertension Father    Stroke Father    Cancer Paternal Grandmother        not sure what type   Colon cancer Neg Hx    Esophageal cancer Neg Hx    Rectal cancer Neg Hx    Stomach cancer Neg Hx  Social History   Tobacco Use   Smoking status: Never   Smokeless tobacco: Never  Vaping Use   Vaping status: Never Used  Substance Use Topics   Alcohol use: No   Drug use: No    Current Outpatient Medications  Medication Sig Dispense Refill   alum & mag hydroxide-simeth (MAALOX/MYLANTA) 200-200-20 MG/5ML suspension Take 15 mLs by mouth at bedtime.     ascorbic acid (VITAMIN C) 500 MG tablet Take 500 mg by mouth daily.     calcium  carbonate (TUMS - DOSED IN MG ELEMENTAL CALCIUM ) 500 MG chewable tablet Chew 1 tablet by mouth 3 (three) times daily as needed for indigestion or heartburn. Reported on 05/19/2015     cetirizine (ZYRTEC) 10 MG tablet Take 10 mg by mouth daily.     gabapentin   (NEURONTIN ) 300 MG capsule TAKE 1 CAPSULE BY MOUTH 3 TIMES DAILY FOR NEUROPATHY 270 capsule 1   ibuprofen  (ADVIL ,MOTRIN ) 200 MG tablet Take 200 mg by mouth every 6 (six) hours as needed (pain). Reported on 04/15/2015     OVER THE COUNTER MEDICATION Vitamin B6 100 mg tab daily     pantoprazole  (PROTONIX ) 40 MG tablet TAKE ONE TABLET BY MOUTH EVERY DAY 1/2 HOUR BEFORE SUPPER 90 tablet 2   Cholecalciferol (VITAMIN D-3) 125 MCG (5000 UT) TABS Take by mouth. (Patient not taking: Reported on 06/05/2023)     Prenatal MV-Min-FA-Omega-3 (PRENATAL GUMMIES/DHA & FA PO) Take 2 tablets by mouth daily. Take 2 gummies. One a Day multivitamin (Patient not taking: Reported on 06/05/2023)     rosuvastatin  (CRESTOR ) 10 MG tablet Take 10 mg by mouth at bedtime.   3   zinc gluconate 50 MG tablet Take 50 mg by mouth daily. (Patient not taking: Reported on 06/05/2023)     No current facility-administered medications for this visit.    No Known Allergies  Review of Systems:  Psychiatric/Behavioral: has anxiety or depression     Physical Exam:    BP 120/80   Pulse 68   Ht 5\' 7"  (1.702 m)   Wt 212 lb (96.2 kg)   BMI 33.20 kg/m  Filed Weights   06/05/23 1316  Weight: 212 lb (96.2 kg)  HEENT: No jaundice, pallor. Resp: Bilaterally clear CVS: S1-S2 normal no S3 or S4. Abdomen: Soft nontender bowel sounds present no definite hepatosplenomegaly.  Data Reviewed: I have personally reviewed following labs and imaging studies  CBC:    Latest Ref Rng & Units 07/14/2022   10:52 AM 07/13/2021   12:00 AM 01/10/2021   12:00 AM  CBC  WBC 4.0 - 10.5 K/uL 4.9  4.6     5.1      Hemoglobin 12.0 - 15.0 g/dL 16.1  09.6     04.5      Hematocrit 36.0 - 46.0 % 41.5  42     41      Platelets 150 - 400 K/uL 166  151     162         This result is from an external source.    CMP:    Latest Ref Rng & Units 07/14/2022   10:52 AM 07/13/2021   12:00 AM 01/10/2021   12:00 AM  CMP  Glucose 70 - 99 mg/dL 98     BUN 8 - 23  mg/dL 13  14     14       Creatinine 0.44 - 1.00 mg/dL 4.09  0.8     0.8  Sodium 135 - 145 mmol/L 140  141     140      Potassium 3.5 - 5.1 mmol/L 4.3  4.1     4.0      Chloride 98 - 111 mmol/L 106  105     109      CO2 22 - 32 mmol/L 26  27     24       Calcium  8.9 - 10.3 mg/dL 9.5  9.6     9.2      Total Protein 6.5 - 8.1 g/dL 7.4     Total Bilirubin 0.3 - 1.2 mg/dL 1.0     Alkaline Phos 38 - 126 U/L 53  60     62      AST 15 - 41 U/L 27  37     34      ALT 0 - 44 U/L 32  37     34         This result is from an external source.       Magnus Schuller, MD 06/05/2023, 1:42 PM  Cc: Gaither Juba, MD

## 2023-06-05 NOTE — Patient Instructions (Signed)
 _______________________________________________________  If your blood pressure at your visit was 140/90 or greater, please contact your primary care physician to follow up on this.  _______________________________________________________  If you are age 75 or older, your body mass index should be between 23-30. Your Body mass index is 33.2 kg/m. If this is out of the aforementioned range listed, please consider follow up with your Primary Care Provider.  If you are age 20 or younger, your body mass index should be between 19-25. Your Body mass index is 33.2 kg/m. If this is out of the aformentioned range listed, please consider follow up with your Primary Care Provider.   ________________________________________________________  The [facility] providers would like to encourage you to use MYCHART to communicate with providers for non-urgent requests or questions.  Due to long hold times on the telephone, sending your provider a message by [facility] may be a faster and more efficient way to get a response.  Please allow 48 business hours for a response.  Please remember that this is for non-urgent requests.  _______________________________________________________  We have sent the following medications to your pharmacy for you to pick up at your convenience: Protonix   Follow up with Dr Almer Jacobson at [facility].  You will follow up in our office on an as needed basis.  Thank you for entrusting me with your care and choosing [facility].  Dr Venice Gillis

## 2023-07-11 NOTE — Progress Notes (Signed)
 [facility] [address] [place],  [place]  [address] [phone]      PATIENT CARE TEAM: Patient Care Team: Gaither Juba, MD as PCP - General (Family Medicine) Nolia Baumgartner, MD as Consulting Physician (Oncology)  DATE OF VISIT: 07/17/23   CLINIC:  Long Term Survivorship   REASON FOR VISIT:  Routine follow-up for history of recurrent endometrial cancer   BRIEF ONCOLOGIC HISTORY:  Erica Moyer is a 73 y.o. female with stage IA (T1 N0 M0) endometrioid adenocarcinoma treated with robotic laparoscopic hysterectomy and bilateral salpingo-oophorectomy in January 2017.  Pathology revealed a 2.4 cm, grade 3, endometrioid adenocarcinoma with squamous differentiation.  There was superficial myometrial invasion.  Focal lymphovascular invasion was seen.  Seven nodes were negative for metastasis.  She received adjuvant radiotherapy completed in April 2017.   She developed abdominal pain and change in her stool caliber in June.  She saw Dr. Eloise Hake in July 2017 and pelvic examination was normal.  CT abdomen and pelvis revealed recurrent endometrial carcinoma in the peritoneum with associated left hydronephrosis.  Biopsy in August revealed poorly differentiated carcinoma consistent with her endometrial primary.  She saw Dr. Willis Harter, who recommended palliative chemotherapy with carboplatin /paclitaxel .  We began seeing her in August 2017 to initiate chemotherapy.  She received 6 cycles of carboplatin /paclitaxel  completed in December 2017.  She had difficulty tolerating chemotherapy due to anorexia, nausea, dehydration, neuropathy and hypomagnesemia.  She required IV and oral magnesium replacement.  She developed peripheral neuropathy of the hands and feet and was given gabapentin  300 mg three times daily and pyridoxine 100 mg daily.  She has never had any evidence of recurrence.  Repeat imaging and tumor markers have remained normal since completing chemotherapy.   Her last CT abdomen and pelvis was done in May 2022.  Annual screening mammograms have not revealed any evidence of malignancy.  She had colonoscopy in February 2024 with removal of a small adenomatous polyp.  Random colon biopsies were negative.  Nonbleeding internal hemorrhoids were seen.  The exam was otherwise negative   Oncology History Overview Note  Erica Moyer reports having an episode of vaginal spotting in May 2016. She initially thought this was due to urethral irritation as she has bladder prolapse. She then continued to spot through the summer and fall of 2016 and was evaluated by Dr. Wayna Hails on 01/19/2015 at which time a transvaginal ultrasound scan was performed which revealed a normal size uterus measuring 7.4 x 8.4 x 3.7 cm with a thickened endometrial stripe 15 mm. The ovaries were grossly normal. She then underwent a endometrial biopsy on 01/28/2015 which revealed FIGO grade 2 moderately differentiated endometrioid adenocarcinoma   Endometrial cancer (HCC)  01/28/2015 Initial Diagnosis   Endometrial cancer (HCC) FIGO grade 2 moderately differentiated endometrioid adenocarcinoma   02/25/2015 Surgery   robotic laparoscopic hysterectomy bilateral salpingo-oophorectomy and sentinel lymph nodes on    02/25/2015 Pathology Results    grade 3 endometrial cancer measuring 2.4 cm in diameter with superficial myometrial invasion. There was focal lymphatic vascular involvement. Lymph nodes and adnexa were negative for any metastatic disease.  Stage IA     - 06/11/2015 Radiation Therapy   Vaginal cuff brachytherapy   03/02/2015 Cancer Staging   Staging form: Corpus Uteri - Carcinoma, AJCC 7th Edition - Clinical stage from 03/02/2015: FIGO Stage IA (T1a, N0, M0) - Signed by Nolia Baumgartner, MD on 01/09/2021 Staged by: Managing physician Diagnostic confirmation: Positive histology Specimen type: Excision Histopathologic  type: Endometrioid adenocarcinoma, NOS Stage prefix:  Initial diagnosis Tumor size (mm): 24 Histologic grade (G): G3 Lymph-vascular invasion (LVI): LVI present/identified, NOS Residual tumor (R): R0 - None Peritoneal cytology results: Negative Pelvic nodal status: Negative Number of pelvic nodes examined during dissection: 7 Omentectomy performed: No Prognostic indicators: Robotic TAH/BSO with adjuvant radiation Stage used in treatment planning: Yes National guidelines used in treatment planning: Yes Type of national guideline used in treatment planning: NCCN   08/2015 Relapse/Recurrence    She was seen by Dr. Jacquelene Mathieu in mid July and at that time complained of increasing pelvic and lower back pain. CT scan was obtained on July 26 demonstrated what appears to be intraperitoneal disease including a left-sided metastatic site measuring 4.6 x 3.4 x 3 cm causing mass effect on the distal third of the left ureter and moderate proximal hydronephrosis and has some mass effect on the sigmoid colon. There were 2 implants in the right hemipelvis measuring 3 x 2.2 and 2.9 x 2.3 cm.   CT Guided biopsy c/w recurrent endometrial cancer.    08/2015 - 01/15/2016 Chemotherapy   Received adjuvant taxol /carboplatin  x 6   02/10/2016 Imaging   Imaging 02/10/2016 without evidence of metastatic carcinoma   05/08/2016 Imaging   Imaging 05/08/2016 without evidence of recurrent disease.   11/02/2016 Imaging   No evidence of metastatic disease.  Bilateral inguinal hernias containing fat   04/27/2017 Imaging   CT chest abdomen and pelvis No findings worrisome for metastatic disease in the chest abdomen or pelvis.  There is small pulmonary nodules bilaterally which are predominantly subpleural or perifissural all likely lymph nodes some of these were not previously imaged suggest attention on routine follow-up   05/01/2017 Miscellaneous   CA125    8.8   11/01/2017 Imaging   CT of the chest abdomen and pelvis with contrast Small 4 mm right upper lobe pulmonary nodule  likely part of the apical pleural-parenchymal scarring Small type I hiatal hernia aortic atherosclerosis no findings of active or recurrent malignancy   04/29/2018 Imaging   CT of the abdomen and pelvis No evidence of recurrent or metastatic disease     INTERVAL HISTORY:  Erica Moyer presents to the Survivorship Clinic today for routine follow-up for her history of endometrial cancer.  Overall, she reports feeling well. She denies any changes in her family history.  She reports persistent neuropathy of the hands and feet.  She also reports persistent swelling of the bilateral feet and ankles, which has not worsened. She reports occasional shortness of breath when lying down, which she attributes to acid reflux. She reports unpredictable bowel movements with occasional stool incontinence, which is chronic. Bilateral screening mammogram in April 2025 did not reveal any evidence of malignancy.    REVIEW OF SYSTEMS:  Review of Systems  Constitutional:  Negative for appetite change, chills, fatigue, fever and unexpected weight change.  HENT:   Negative for lump/mass, mouth sores and sore throat.   Respiratory:  Positive for shortness of breath (intermittent, when supine). Negative for cough, hemoptysis and wheezing.   Cardiovascular:  Positive for leg swelling (chronic bilateral feet and ankles). Negative for chest pain and palpitations.  Gastrointestinal:  Negative for abdominal pain, blood in stool, constipation, diarrhea, nausea and vomiting.  Endocrine: Positive for hot flashes.  Genitourinary:  Negative for difficulty urinating, dysuria, frequency, hematuria, vaginal bleeding and vaginal discharge.   Musculoskeletal:  Negative for arthralgias, back pain, gait problem, myalgias and neck pain.  Skin:  Negative for rash.  Neurological:  Positive for numbness (neuropathy of hands and feet). Negative for dizziness, extremity weakness, gait problem, headaches and light-headedness.  Hematological:   Negative for adenopathy. Does not bruise/bleed easily.  Psychiatric/Behavioral:  Negative for depression and sleep disturbance. The patient is not nervous/anxious.      PAST MEDICAL/SURGICAL HISTORY:  Past Medical History:  Diagnosis Date   Allergy    seasonal   Arthritis    Cancer (HCC)    endometrial cancer   Complication of anesthesia    slow to wake up   Frequency of urination    GERD (gastroesophageal reflux disease)    Headache    hx migraines   Hyperlipidemia    Neuropathy    Osteopenia    PONV (postoperative nausea and vomiting)    Radiation 05/19/15-06/11/15   vaginal cuff 30 Gy   Urgency incontinence     Past Surgical History:  Procedure Laterality Date   ABDOMINAL HYSTERECTOMY     CHOLECYSTECTOMY     COLONOSCOPY     ROBOTIC ASSISTED TOTAL HYSTERECTOMY WITH BILATERAL SALPINGO OOPHERECTOMY Bilateral 02/25/2015   Procedure: XI ROBOTIC ASSISTED TOTAL HYSTERECTOMY WITH BILATERAL SALPINGO OOPHORECTOMY WITH SENTINAL LYMPH NODE MAPPING;  Surgeon: Terry Ficks, MD;  Location: [facility];  Service: Gynecology;  Laterality: Bilateral;   TUBAL LIGATION     1986     ALLERGIES:  No Known Allergies   CURRENT MEDICATIONS:  Outpatient Encounter Medications as of 07/17/2023  Medication Sig Note   b complex vitamins capsule Take 1 capsule by mouth daily. B 6    alum & mag hydroxide-simeth (MAALOX/MYLANTA) 200-200-20 MG/5ML suspension Take 15 mLs by mouth at bedtime.    ascorbic acid (VITAMIN C) 500 MG tablet Take 500 mg by mouth daily.    calcium  carbonate (TUMS - DOSED IN MG ELEMENTAL CALCIUM ) 500 MG chewable tablet Chew 1 tablet by mouth 3 (three) times daily as needed for indigestion or heartburn. Reported on 05/19/2015    cetirizine (ZYRTEC) 10 MG tablet Take 10 mg by mouth daily.    Cholecalciferol (VITAMIN D-3) 125 MCG (5000 UT) TABS Take by mouth. (Patient not taking: Reported on 06/05/2023)    gabapentin  (NEURONTIN ) 300 MG capsule TAKE 1 CAPSULE BY MOUTH 3 TIMES DAILY FOR  NEUROPATHY    ibuprofen  (ADVIL ,MOTRIN ) 200 MG tablet Take 200 mg by mouth every 6 (six) hours as needed (pain). Reported on 04/15/2015 11/15/2020: PRN   OVER THE COUNTER MEDICATION Vitamin B6 100 mg tab daily    pantoprazole  (PROTONIX ) 40 MG tablet Take 1 tablet (40 mg total) by mouth daily.    Prenatal MV-Min-FA-Omega-3 (PRENATAL GUMMIES/DHA & FA PO) Take 2 tablets by mouth daily. Take 2 gummies. One a Day multivitamin (Patient not taking: Reported on 06/05/2023)    rosuvastatin  (CRESTOR ) 10 MG tablet Take 10 mg by mouth at bedtime.     zinc gluconate 50 MG tablet Take 50 mg by mouth daily. (Patient not taking: Reported on 06/05/2023)    No facility-administered encounter medications on file as of 07/17/2023.     ONCOLOGIC FAMILY HISTORY:  Family History  Problem Relation Age of Onset   Anesthesia problems Mother    Hypertension Mother    Hypertension Father    Stroke Father    Cancer Paternal Grandmother        not sure what type   Colon cancer Neg Hx    Esophageal cancer Neg Hx    Rectal cancer Neg Hx    Stomach cancer Neg Hx  SOCIAL HISTORY:   reports that she has never smoked. She has never used smokeless tobacco. She reports that she does not drink alcohol and does not use drugs.   PHYSICAL EXAMINATION:  Vital Signs: Vitals:   07/17/23 1342 07/17/23 1349  BP: (!) 141/70 137/75  Resp: 20   Temp: 98.6 F (37 C)   SpO2: 100%    Filed Weights   07/17/23 1342  Weight: 210 lb 3.2 oz (95.3 kg)   Physical Exam Vitals and nursing note reviewed.  Constitutional:      General: She is not in acute distress.    Appearance: Normal appearance.  HENT:     Head: Normocephalic and atraumatic.     Mouth/Throat:     Mouth: Mucous membranes are moist.     Pharynx: Oropharynx is clear. No oropharyngeal exudate or posterior oropharyngeal erythema.  Eyes:     General: No scleral icterus.    Extraocular Movements: Extraocular movements intact.     Conjunctiva/sclera: Conjunctivae  normal.     Pupils: Pupils are equal, round, and reactive to light.  Cardiovascular:     Rate and Rhythm: Normal rate and regular rhythm.     Heart sounds: Normal heart sounds. No murmur heard.    No friction rub. No gallop.  Pulmonary:     Effort: Pulmonary effort is normal.     Breath sounds: Normal breath sounds. No wheezing, rhonchi or rales.  Abdominal:     General: There is no distension.     Palpations: Abdomen is soft. There is no hepatomegaly, splenomegaly or mass.     Tenderness: There is no abdominal tenderness.  Musculoskeletal:        General: Normal range of motion.     Cervical back: Normal range of motion and neck supple. No tenderness.     Right lower leg: No edema.     Left lower leg: No edema.  Lymphadenopathy:     Cervical: No cervical adenopathy.     Upper Body:     Right upper body: No supraclavicular or axillary adenopathy.     Left upper body: No supraclavicular or axillary adenopathy.     Lower Body: No right inguinal adenopathy. No left inguinal adenopathy.  Skin:    General: Skin is warm and dry.     Coloration: Skin is not jaundiced.     Findings: No rash.  Neurological:     Mental Status: She is alert and oriented to person, place, and time.     Cranial Nerves: No cranial nerve deficit.  Psychiatric:        Mood and Affect: Mood normal.        Behavior: Behavior normal.        Thought Content: Thought content normal.      LABORATORY DATA:     Latest Ref Rng & Units 07/17/2023    1:06 PM 07/14/2022   10:52 AM 07/13/2021   12:00 AM  CBC  WBC 4.0 - 10.5 K/uL 4.2  4.9  4.6      Hemoglobin 12.0 - 15.0 g/dL 13.0  86.5  78.4      Hematocrit 36.0 - 46.0 % 40.4  41.5  42      Platelets 150 - 400 K/uL 175  166  151         This result is from an external source.    Latest Reference Range & Units 07/17/23 13:06  Neutrophils % 59  Lymphocytes % 30  Monocytes Relative %  8  Eosinophil % 2  Basophil % 1  Immature Granulocytes % 0  NEUT# 1.7 - 7.7  K/uL 2.5  Lymphs Abs 0.7 - 4.0 K/uL 1.2  Monocyte # 0.1 - 1.0 K/uL 0.3  Eosinophils Absolute 0.0 - 0.5 K/uL 0.1  Basophils Absolute 0.0 - 0.1 K/uL 0.0  Abs Immature Granulocytes 0.00 - 0.07 K/uL 0.01      Latest Ref Rng & Units 07/17/2023    1:06 PM 07/14/2022   10:52 AM 07/13/2021   12:00 AM  CMP  Glucose 70 - 99 mg/dL 94  98    BUN 8 - 23 mg/dL 12  13  14       Creatinine 0.44 - 1.00 mg/dL 1.61  0.96  0.8      Sodium 135 - 145 mmol/L 142  140  141      Potassium 3.5 - 5.1 mmol/L 4.4  4.3  4.1      Chloride 98 - 111 mmol/L 105  106  105      CO2 22 - 32 mmol/L 26  26  27       Calcium  8.9 - 10.3 mg/dL 04.5  9.5  9.6      Total Protein 6.5 - 8.1 g/dL 6.9  7.4    Total Bilirubin 0.0 - 1.2 mg/dL 0.6  1.0    Alkaline Phos 38 - 126 U/L 65  53  60      AST 15 - 41 U/L 28  27  37      ALT 0 - 44 U/L 30  32  37         This result is from an external source.    DIAGNOSTIC IMAGING:  No results found.   ASSESSMENT AND PLAN:  Erica Moyer is a pleasant 73 y.o. female with history of recurrent uterine cancer diagnosed in August 2017. She was treated with chemotherapy with carboplatin  and paclitaxel  for 6 cycles with a complete response.   She originally had stage IA, grade 3 disease treated with surgery followed by adjuvant brachytherapy.  1. History of recurrent uterine cancer:  Erica Moyer is currently clinically without evidence of disease or recurrence of uterine cancer cancer.  She will follow-up in the Survivorship Clinic in 1 year with labs, history, and physical exam per surveillance protocol.  I encouraged her to call me with any questions or concerns before her next visit at the cancer center, and I would be happy to see the patient sooner, if needed.    2. Chronic mild pedal/ankle edema with new intermittent orthopnea.  She attributes the orthopnea to acid reflux.  She states her husband sees Dr. Ransom Byers, so I will refer her to Dr. Ransom Byers for his recommendation.  3. Cancer screening:   Due to Erica Moyer's history and age, she should receive screening for skin cancers, breast cancer, colon cancer, and gynecologic cancers. The patient was encouraged to follow-up with her PCP for appropriate cancer screenings.   4. Health maintenance and wellness promotion: Erica Moyer was encouraged to consume 5-7 servings of fruits and vegetables per day. The patient was also encouraged to engage in moderate to vigorous exercise for 30 minutes per day most days of the week. Erica Moyer was instructed to limit her alcohol consumption and continue to abstain from tobacco use.    Disposition:  -Return to Cancer Center to see the APP in Long Term Survivorship in 1 year.   A total of 30 minutes of face-to-face time was spent  with this patient with greater than 50% of that time in counseling and care-coordination.   Vela Render A. Veverly Grace, PA-C Physician Assistant [facility]   Note: PRIMARY CARE PROVIDER Gaither Juba, [place] [phone]

## 2023-07-17 ENCOUNTER — Other Ambulatory Visit: Payer: Self-pay

## 2023-07-17 ENCOUNTER — Inpatient Hospital Stay: Payer: PPO | Attending: Hematology and Oncology | Admitting: Hematology and Oncology

## 2023-07-17 ENCOUNTER — Encounter: Payer: Self-pay | Admitting: Hematology and Oncology

## 2023-07-17 ENCOUNTER — Other Ambulatory Visit: Payer: Self-pay | Admitting: Hematology and Oncology

## 2023-07-17 ENCOUNTER — Inpatient Hospital Stay: Payer: PPO

## 2023-07-17 VITALS — BP 137/75 | HR 60 | Temp 98.6°F | Resp 20 | Ht 67.0 in | Wt 210.2 lb

## 2023-07-17 DIAGNOSIS — Z90722 Acquired absence of ovaries, bilateral: Secondary | ICD-10-CM | POA: Insufficient documentation

## 2023-07-17 DIAGNOSIS — Z9079 Acquired absence of other genital organ(s): Secondary | ICD-10-CM | POA: Insufficient documentation

## 2023-07-17 DIAGNOSIS — C786 Secondary malignant neoplasm of retroperitoneum and peritoneum: Secondary | ICD-10-CM

## 2023-07-17 DIAGNOSIS — M25473 Effusion, unspecified ankle: Secondary | ICD-10-CM | POA: Diagnosis not present

## 2023-07-17 DIAGNOSIS — Z8542 Personal history of malignant neoplasm of other parts of uterus: Secondary | ICD-10-CM | POA: Diagnosis not present

## 2023-07-17 DIAGNOSIS — C541 Malignant neoplasm of endometrium: Secondary | ICD-10-CM

## 2023-07-17 DIAGNOSIS — Z8742 Personal history of other diseases of the female genital tract: Secondary | ICD-10-CM

## 2023-07-17 DIAGNOSIS — Z08 Encounter for follow-up examination after completed treatment for malignant neoplasm: Secondary | ICD-10-CM | POA: Diagnosis not present

## 2023-07-17 DIAGNOSIS — Z9221 Personal history of antineoplastic chemotherapy: Secondary | ICD-10-CM | POA: Diagnosis not present

## 2023-07-17 DIAGNOSIS — Z90710 Acquired absence of both cervix and uterus: Secondary | ICD-10-CM | POA: Insufficient documentation

## 2023-07-17 LAB — CMP (CANCER CENTER ONLY)
ALT: 30 U/L (ref 0–44)
AST: 28 U/L (ref 15–41)
Albumin: 4.5 g/dL (ref 3.5–5.0)
Alkaline Phosphatase: 65 U/L (ref 38–126)
Anion gap: 12 (ref 5–15)
BUN: 12 mg/dL (ref 8–23)
CO2: 26 mmol/L (ref 22–32)
Calcium: 10 mg/dL (ref 8.9–10.3)
Chloride: 105 mmol/L (ref 98–111)
Creatinine: 1.01 mg/dL — ABNORMAL HIGH (ref 0.44–1.00)
GFR, Estimated: 58 mL/min — ABNORMAL LOW (ref >60)
Glucose, Bld: 94 mg/dL (ref 70–99)
Potassium: 4.4 mmol/L (ref 3.5–5.1)
Sodium: 142 mmol/L (ref 135–145)
Total Bilirubin: 0.6 mg/dL (ref 0.0–1.2)
Total Protein: 6.9 g/dL (ref 6.5–8.1)

## 2023-07-17 LAB — CBC WITH DIFFERENTIAL (CANCER CENTER ONLY)
Abs Immature Granulocytes: 0.01 10*3/uL (ref 0.00–0.07)
Basophils Absolute: 0 10*3/uL (ref 0.0–0.1)
Basophils Relative: 1 %
Eosinophils Absolute: 0.1 10*3/uL (ref 0.0–0.5)
Eosinophils Relative: 2 %
HCT: 40.4 % (ref 36.0–46.0)
Hemoglobin: 13.4 g/dL (ref 12.0–15.0)
Immature Granulocytes: 0 %
Lymphocytes Relative: 30 %
Lymphs Abs: 1.2 10*3/uL (ref 0.7–4.0)
MCH: 27.9 pg (ref 26.0–34.0)
MCHC: 33.2 g/dL (ref 30.0–36.0)
MCV: 84 fL (ref 80.0–100.0)
Monocytes Absolute: 0.3 10*3/uL (ref 0.1–1.0)
Monocytes Relative: 8 %
Neutro Abs: 2.5 10*3/uL (ref 1.7–7.7)
Neutrophils Relative %: 59 %
Platelet Count: 175 10*3/uL (ref 150–400)
RBC: 4.81 MIL/uL (ref 3.87–5.11)
RDW: 13.5 % (ref 11.5–15.5)
WBC Count: 4.2 10*3/uL (ref 4.0–10.5)
nRBC: 0 % (ref 0.0–0.2)

## 2023-07-17 LAB — CEA (ACCESS): CEA (CHCC): <1 ng/mL (ref 0.00–5.00)

## 2023-07-17 LAB — MAGNESIUM: Magnesium: 1.9 mg/dL (ref 1.7–2.4)

## 2023-07-18 ENCOUNTER — Telehealth: Payer: Self-pay

## 2023-07-18 ENCOUNTER — Telehealth: Payer: Self-pay | Admitting: Hematology and Oncology

## 2023-07-18 LAB — CA 125: Cancer Antigen (CA) 125: 8.5 U/mL (ref 0.0–38.1)

## 2023-07-18 NOTE — Addendum Note (Signed)
 Addended by: Tucker Gails on: 07/18/2023 01:29 PM   Modules accepted: Orders

## 2023-07-18 NOTE — Telephone Encounter (Signed)
 Patient notified and voiced understanding.

## 2023-07-18 NOTE — Telephone Encounter (Signed)
 E mail /fax  referral sent to Dr Ransom Byers  Fax #[phone]  Phone [phone] [address]  [place]

## 2023-07-18 NOTE — Telephone Encounter (Signed)
 Patient has been scheduled for follow-up visit per 07/17/23 LOS.  Pt aware of scheduled appt details.     Follow-Up Information  Follow-up disposition: Return in about 1 year (around 07/16/2024).  Check out comments: Labs and f/u with Kelli in 1 year for long term survivorship

## 2023-07-18 NOTE — Telephone Encounter (Signed)
-----   Message from Alfonso Ike sent at 07/17/2023  4:41 PM EDT ----- Please let her know her magnesium level was normal.  I do not feel we need to continue to follow this unless she has symptoms.  Thanks

## 2023-07-18 NOTE — Telephone Encounter (Signed)
-----   Message from Alfonso Ike sent at 07/18/2023  9:31 AM EDT ----- Please refer to Dr. Ransom Byers, cardiology in [place], for chronic pedal and ankle edema and intermittent shortness of breath when supine. Thank you

## 2023-07-31 DIAGNOSIS — R001 Bradycardia, unspecified: Secondary | ICD-10-CM | POA: Diagnosis not present

## 2023-07-31 DIAGNOSIS — Z9221 Personal history of antineoplastic chemotherapy: Secondary | ICD-10-CM | POA: Diagnosis not present

## 2023-07-31 DIAGNOSIS — R0602 Shortness of breath: Secondary | ICD-10-CM | POA: Diagnosis not present

## 2023-07-31 DIAGNOSIS — Z8542 Personal history of malignant neoplasm of other parts of uterus: Secondary | ICD-10-CM | POA: Diagnosis not present

## 2023-08-07 DIAGNOSIS — I088 Other rheumatic multiple valve diseases: Secondary | ICD-10-CM | POA: Diagnosis not present

## 2023-10-22 DIAGNOSIS — Z1331 Encounter for screening for depression: Secondary | ICD-10-CM | POA: Diagnosis not present

## 2023-10-22 DIAGNOSIS — Z6832 Body mass index (BMI) 32.0-32.9, adult: Secondary | ICD-10-CM | POA: Diagnosis not present

## 2023-10-22 DIAGNOSIS — E7800 Pure hypercholesterolemia, unspecified: Secondary | ICD-10-CM | POA: Diagnosis not present

## 2023-10-22 DIAGNOSIS — Z79899 Other long term (current) drug therapy: Secondary | ICD-10-CM | POA: Diagnosis not present

## 2023-10-22 DIAGNOSIS — Z0000 Encounter for general adult medical examination without abnormal findings: Secondary | ICD-10-CM | POA: Diagnosis not present

## 2023-10-22 DIAGNOSIS — Z1339 Encounter for screening examination for other mental health and behavioral disorders: Secondary | ICD-10-CM | POA: Diagnosis not present

## 2023-10-31 DIAGNOSIS — R0602 Shortness of breath: Secondary | ICD-10-CM | POA: Diagnosis not present

## 2023-11-06 DIAGNOSIS — D2239 Melanocytic nevi of other parts of face: Secondary | ICD-10-CM | POA: Diagnosis not present

## 2023-11-06 DIAGNOSIS — L814 Other melanin hyperpigmentation: Secondary | ICD-10-CM | POA: Diagnosis not present

## 2023-11-06 DIAGNOSIS — D225 Melanocytic nevi of trunk: Secondary | ICD-10-CM | POA: Diagnosis not present

## 2023-11-06 DIAGNOSIS — L821 Other seborrheic keratosis: Secondary | ICD-10-CM | POA: Diagnosis not present

## 2023-12-13 DIAGNOSIS — Z23 Encounter for immunization: Secondary | ICD-10-CM | POA: Diagnosis not present

## 2024-07-16 ENCOUNTER — Other Ambulatory Visit

## 2024-07-16 ENCOUNTER — Encounter: Admitting: Hematology and Oncology
